# Patient Record
Sex: Male | Born: 1945 | Race: White | Hispanic: No | Marital: Single | Smoking: Current every day smoker
Health system: Southern US, Community
[De-identification: ages and names within clinical notes are randomized; demographics above are authoritative.]

## PROBLEM LIST (undated history)

## (undated) ENCOUNTER — Ambulatory Visit: Admission: EM | Discharge status: Absent without leave | Payer: Self-pay

## (undated) DIAGNOSIS — I10 Essential (primary) hypertension: Secondary | ICD-10-CM

## (undated) DIAGNOSIS — F0280 Dementia in other diseases classified elsewhere without behavioral disturbance: Secondary | ICD-10-CM

## (undated) DIAGNOSIS — R569 Unspecified convulsions: Secondary | ICD-10-CM

## (undated) DIAGNOSIS — F0390 Unspecified dementia without behavioral disturbance: Secondary | ICD-10-CM

---

## 2005-09-24 ENCOUNTER — Emergency Department (HOSPITAL_COMMUNITY): Admission: EM | Admit: 2005-09-24 | Discharge: 2005-09-24 | Payer: Self-pay | Admitting: Family Medicine

## 2005-11-27 ENCOUNTER — Emergency Department (HOSPITAL_COMMUNITY): Admission: EM | Admit: 2005-11-27 | Discharge: 2005-11-28 | Payer: Self-pay | Admitting: Emergency Medicine

## 2007-12-28 ENCOUNTER — Ambulatory Visit: Payer: Self-pay | Admitting: Internal Medicine

## 2013-10-18 ENCOUNTER — Ambulatory Visit: Payer: Self-pay | Admitting: Family Medicine

## 2013-10-25 ENCOUNTER — Ambulatory Visit: Payer: Self-pay | Admitting: Family Medicine

## 2013-11-01 ENCOUNTER — Ambulatory Visit: Payer: Self-pay | Admitting: Family Medicine

## 2013-11-08 ENCOUNTER — Ambulatory Visit: Payer: Self-pay | Admitting: Family Medicine

## 2014-01-05 ENCOUNTER — Ambulatory Visit: Payer: Self-pay | Admitting: Emergency Medicine

## 2014-01-25 ENCOUNTER — Ambulatory Visit: Payer: Self-pay

## 2016-04-12 ENCOUNTER — Encounter: Payer: Self-pay | Admitting: Emergency Medicine

## 2016-04-12 ENCOUNTER — Ambulatory Visit
Admission: EM | Admit: 2016-04-12 | Discharge: 2016-04-12 | Disposition: A | Discharge status: Home / Self Care | Payer: Worker's Compensation | Attending: Family Medicine | Admitting: Family Medicine

## 2016-04-12 ENCOUNTER — Ambulatory Visit (INDEPENDENT_AMBULATORY_CARE_PROVIDER_SITE_OTHER): Payer: Worker's Compensation

## 2016-04-12 DIAGNOSIS — S90111A Contusion of right great toe without damage to nail, initial encounter: Secondary | ICD-10-CM

## 2016-04-12 NOTE — Discharge Instructions (Signed)
Avoid reinjury. Apply ice as needed.    Return to Urgent care for new or worsening concerns.

## 2016-04-12 NOTE — ED Triage Notes (Signed)
Patient states that last night at work he ran into a pallet with his right foot and c/o pain in his right 1st toe.

## 2016-04-12 NOTE — ED Provider Notes (Signed)
[facility] ____________________________________________  Time seen: Approximately 1:45 PM  I have reviewed the triage vital signs and the nursing notes.   HISTORY  Chief Complaint Toe Pain and Worker's Comp Injury   HPI Jeremy HumphreysWilliam Henry De BurrsSmith Jr. is a 71 y.o. male presents for complaints of right great toe pain. 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 Patient reports early this morning he was at work, pushing a pallet through doors, and reports the doors hit the pallet causing him to hit the pallet. Patient states that his right great toe hit the palate. Patient reports pain immediately after injury. Patient reports continued to work throughout the night. Patient reports after sleeping during the day today, pain has much improved. Patient reports he did take over-the-counter ibuprofen. Patient states no pain with sitting still at current time, states minimal when walking. Denies pain radiation, paresthesias, other pain or injury. Denies fall to the ground. Denies head injury or loss consciousness. Patient reports otherwise feels well. Denies other complaints.   Denies recent sickness. Denies recent antibiotic use.    History reviewed. No pertinent past medical history.  There are no active problems to display for this patient.   History reviewed. No pertinent surgical history.   No current facility-administered medications for this encounter.  No current outpatient prescriptions on file.  Allergies Tetracyclines & related  History reviewed. No pertinent family history.  Social History Social History  Substance Use Topics  . Smoking status: Current Every Day Smoker    Types: Cigars  . Smokeless tobacco: Never Used  . Alcohol use No    Review of Systems Constitutional: No fever/chills Eyes: No visual changes. Cardiovascular: Denies chest pain. Respiratory: Denies shortness of breath. Gastrointestinal: No abdominal pain.  No nausea, no  vomiting.  No diarrhea.  No constipation. Musculoskeletal: Negative for back pain. Skin: Negative for rash. Neurological: Negative for  focal weakness or numbness.  10-point ROS otherwise negative.  ____________________________________________   PHYSICAL EXAM:  VITAL SIGNS: ED Triage Vitals  Enc Vitals Group     BP 04/12/16 1344 136/69     Pulse Rate 04/12/16 1344 69     Resp 04/12/16 1344 16     Temp 04/12/16 1344 98.7 F (37.1 C)     Temp Source 04/12/16 1344 Oral     SpO2 04/12/16 1344 97 %     Weight 04/12/16 1342 210 lb (95.3 kg)     Height 04/12/16 1342 5\' 10"  (1.778 m)     Head Circumference --      Peak Flow --      Pain Score 04/12/16 1344 5     Pain Loc --      Pain Edu? --      Excl. in GC? --     Constitutional: Alert and oriented. Well appearing and in no acute distress. Eyes: Conjunctivae are normal. PERRL. EOMI. ENT      Head: Normocephalic and atraumatic. Cardiovascular: Normal rate, regular rhythm. Grossly normal heart sounds.  Good peripheral circulation. Respiratory: Normal respiratory effort without tachypnea nor retractions. Breath sounds are clear and equal bilaterally. No wheezes, rales, rhonchi. Musculoskeletal:   No midline cervical, thoracic or lumbar tenderness to palpation. Bilateral pedal pulses equal and easily palpated.  except: Right great toe medially mild tenderness to palpation, full range of motion, no swelling, no ecchymosis, no motor or tendon deficit, normal distal sensation to right foot, right foot otherwise nontender, right great toe nail well attached, skin intact. Ambulatory with steady gait.  Callused toe nails to all toes right foot, nontender.  Neurologic:  Normal speech and language. Speech is normal. No gait instability.  Skin:  Skin is warm, dry Psychiatric: Mood and affect are normal. Speech and behavior are normal. Patient exhibits appropriate insight and judgment   ___________________________________________    LABS (all labs ordered are listed, but only abnormal results are displayed)  Labs Reviewed - No data to display  RADIOLOGY  Dg Toe Great Right  Result Date: 04/12/2016 CLINICAL DATA:  71 year old male with right great toe pain following injury last night. Initial encounter. EXAM: RIGHT GREAT TOE COMPARISON:  None. FINDINGS: There is no evidence of fracture or dislocation. There is no evidence of arthropathy or other focal bone abnormality. Soft tissues are unremarkable. IMPRESSION: Negative. Electronically Signed   By: Harmon Pier M.D.   On: 04/12/2016 14:16   ____________________________________________   PROCEDURES Procedures     INITIAL IMPRESSION / ASSESSMENT AND PLAN / ED COURSE  Pertinent labs & imaging results that were available during my care of the patient were reviewed by me and considered in my medical decision making (see chart for details).  Well-appearing patient. No acute distress. Present for the complaints of right great toe pain post injury at work last night. Per radiologist's right great toe x-ray negative. Suspect contusion injury. Patient states that he feels fine and ready to return to work. Note given that patient may return to work with no restrictions. Follow-up with Francesco Sor as needed for continued pain or issues.  Discussed follow up with Primary care physician this week. Discussed follow up and return parameters including no resolution or any worsening concerns. Patient verbalized understanding and agreed to plan.   ____________________________________________   FINAL CLINICAL IMPRESSION(S) / ED DIAGNOSES  Final diagnoses:  Contusion of right great toe without damage to nail, initial encounter     There are no discharge medications for this patient.   Note: This dictation was prepared with Dragon dictation along with smaller phrase technology. Any transcriptional errors that result from this process are unintentional.     Jeremy Dills, NP 04/12/16 [phone]

## 2017-01-18 ENCOUNTER — Other Ambulatory Visit: Payer: Self-pay | Admitting: Emergency Medicine

## 2017-01-18 ENCOUNTER — Emergency Department: Payer: Medicaid Other

## 2017-01-18 ENCOUNTER — Observation Stay
Admission: EM | Admit: 2017-01-18 | Discharge: 2017-01-21 | Disposition: A | Discharge status: Home / Self Care | Payer: Medicaid Other | Attending: Internal Medicine | Admitting: Internal Medicine

## 2017-01-18 DIAGNOSIS — Z6832 Body mass index (BMI) 32.0-32.9, adult: Secondary | ICD-10-CM | POA: Insufficient documentation

## 2017-01-18 DIAGNOSIS — R001 Bradycardia, unspecified: Secondary | ICD-10-CM | POA: Insufficient documentation

## 2017-01-18 DIAGNOSIS — Z79899 Other long term (current) drug therapy: Secondary | ICD-10-CM | POA: Insufficient documentation

## 2017-01-18 DIAGNOSIS — R29898 Other symptoms and signs involving the musculoskeletal system: Secondary | ICD-10-CM | POA: Diagnosis not present

## 2017-01-18 DIAGNOSIS — F17290 Nicotine dependence, other tobacco product, uncomplicated: Secondary | ICD-10-CM | POA: Diagnosis not present

## 2017-01-18 DIAGNOSIS — I10 Essential (primary) hypertension: Secondary | ICD-10-CM | POA: Insufficient documentation

## 2017-01-18 DIAGNOSIS — R410 Disorientation, unspecified: Principal | ICD-10-CM | POA: Insufficient documentation

## 2017-01-18 DIAGNOSIS — Z23 Encounter for immunization: Secondary | ICD-10-CM | POA: Diagnosis not present

## 2017-01-18 DIAGNOSIS — R4182 Altered mental status, unspecified: Secondary | ICD-10-CM | POA: Diagnosis present

## 2017-01-18 DIAGNOSIS — M795 Residual foreign body in soft tissue: Secondary | ICD-10-CM

## 2017-01-18 DIAGNOSIS — L8990 Pressure ulcer of unspecified site, unspecified stage: Secondary | ICD-10-CM

## 2017-01-18 DIAGNOSIS — T1590XA Foreign body on external eye, part unspecified, unspecified eye, initial encounter: Secondary | ICD-10-CM

## 2017-01-18 LAB — URINALYSIS, COMPLETE (UACMP) WITH MICROSCOPIC
BILIRUBIN URINE: NEGATIVE
Bacteria, UA: NONE SEEN
GLUCOSE, UA: NEGATIVE mg/dL
HGB URINE DIPSTICK: NEGATIVE
KETONES UR: 5 mg/dL — AB
LEUKOCYTES UA: NEGATIVE
Nitrite: NEGATIVE
PH: 5 (ref 5.0–8.0)
PROTEIN: NEGATIVE mg/dL
Specific Gravity, Urine: 1.026 (ref 1.005–1.030)

## 2017-01-18 LAB — URINE DRUG SCREEN, QUALITATIVE (ARMC ONLY)
Amphetamines, Ur Screen: NOT DETECTED
Barbiturates, Ur Screen: NOT DETECTED
Benzodiazepine, Ur Scrn: NOT DETECTED
CANNABINOID 50 NG, UR ~~LOC~~: NOT DETECTED
COCAINE METABOLITE, UR ~~LOC~~: NOT DETECTED
MDMA (ECSTASY) UR SCREEN: NOT DETECTED
Methadone Scn, Ur: NOT DETECTED
Opiate, Ur Screen: NOT DETECTED
PHENCYCLIDINE (PCP) UR S: NOT DETECTED
Tricyclic, Ur Screen: NOT DETECTED

## 2017-01-18 LAB — COMPREHENSIVE METABOLIC PANEL
ALBUMIN: 4.4 g/dL (ref 3.5–5.0)
ALK PHOS: 81 U/L (ref 38–126)
ALT: 20 U/L (ref 17–63)
ALT: 17 U/L (ref 17–63)
AST: 32 U/L (ref 15–41)
AST: 32 U/L (ref 15–41)
Albumin: 3.8 g/dL (ref 3.5–5.0)
Alkaline Phosphatase: 74 U/L (ref 38–126)
Anion gap: 9 (ref 5–15)
Anion gap: 8 (ref 5–15)
BILIRUBIN TOTAL: 1.5 mg/dL — AB (ref 0.3–1.2)
BILIRUBIN TOTAL: 2 mg/dL — AB (ref 0.3–1.2)
BUN: 24 mg/dL — AB (ref 6–20)
BUN: 19 mg/dL (ref 6–20)
CALCIUM: 8.9 mg/dL (ref 8.9–10.3)
CHLORIDE: 107 mmol/L (ref 101–111)
CO2: 24 mmol/L (ref 22–32)
CO2: 24 mmol/L (ref 22–32)
CREATININE: 1.08 mg/dL (ref 0.61–1.24)
CREATININE: 0.96 mg/dL (ref 0.61–1.24)
Calcium: 8.6 mg/dL — ABNORMAL LOW (ref 8.9–10.3)
Chloride: 106 mmol/L (ref 101–111)
GFR calc Af Amer: >60 mL/min (ref >60)
GLUCOSE: 105 mg/dL — AB (ref 65–99)
Glucose, Bld: 93 mg/dL (ref 65–99)
POTASSIUM: 3.7 mmol/L (ref 3.5–5.1)
POTASSIUM: 3.9 mmol/L (ref 3.5–5.1)
Sodium: 139 mmol/L (ref 135–145)
Sodium: 139 mmol/L (ref 135–145)
TOTAL PROTEIN: 7.8 g/dL (ref 6.5–8.1)
TOTAL PROTEIN: 7 g/dL (ref 6.5–8.1)

## 2017-01-18 LAB — CBC WITH DIFFERENTIAL/PLATELET
Basophils Absolute: 0 10*3/uL (ref 0–0.1)
Basophils Relative: 1 %
EOS PCT: 1 %
Eosinophils Absolute: 0.1 10*3/uL (ref 0–0.7)
HEMATOCRIT: 47 % (ref 40.0–52.0)
Hemoglobin: 16.2 g/dL (ref 13.0–18.0)
LYMPHS ABS: 1.4 10*3/uL (ref 1.0–3.6)
LYMPHS PCT: 26 %
MCH: 32.1 pg (ref 26.0–34.0)
MCHC: 34.4 g/dL (ref 32.0–36.0)
MCV: 93.4 fL (ref 80.0–100.0)
MONO ABS: 0.5 10*3/uL (ref 0.2–1.0)
MONOS PCT: 10 %
NEUTROS ABS: 3.4 10*3/uL (ref 1.4–6.5)
Neutrophils Relative %: 62 %
PLATELETS: 200 10*3/uL (ref 150–440)
RBC: 5.04 MIL/uL (ref 4.40–5.90)
RDW: 13.3 % (ref 11.5–14.5)
WBC: 5.5 10*3/uL (ref 3.8–10.6)

## 2017-01-18 LAB — TROPONIN I

## 2017-01-18 LAB — LACTIC ACID, PLASMA: Lactic Acid, Venous: 1.3 mmol/L (ref 0.5–1.9)

## 2017-01-18 LAB — GLUCOSE, CAPILLARY: GLUCOSE-CAPILLARY: 103 mg/dL — AB (ref 65–99)

## 2017-01-18 LAB — ETHANOL

## 2017-01-18 LAB — AMMONIA: AMMONIA: 41 umol/L — AB (ref 9–35)

## 2017-01-18 MED ORDER — ACETAMINOPHEN 650 MG RE SUPP: 650.0000 mg | Freq: Four times a day (QID) | RECTAL | Status: DC | PRN

## 2017-01-18 MED ORDER — AMIODARONE HCL IN DEXTROSE 360-4.14 MG/200ML-% IV SOLN
INTRAVENOUS | Status: AC
  Filled 2017-01-18: qty 200

## 2017-01-18 MED ORDER — ACETAMINOPHEN 325 MG PO TABS: 650.0000 mg | ORAL_TABLET | Freq: Four times a day (QID) | ORAL | Status: DC | PRN

## 2017-01-18 MED ORDER — HALOPERIDOL LACTATE 5 MG/ML IJ SOLN
2.5000 mg | Freq: Once | INTRAMUSCULAR | Status: AC
  Administered 2017-01-18: 2.5 mg via INTRAMUSCULAR

## 2017-01-18 MED ORDER — LORAZEPAM 2 MG/ML IJ SOLN
1.0000 mg | Freq: Once | INTRAMUSCULAR | Status: AC
  Administered 2017-01-18: 1 mg via INTRAVENOUS
  Filled 2017-01-18: qty 1

## 2017-01-18 MED ORDER — ONDANSETRON HCL 4 MG/2ML IJ SOLN: 4.0000 mg | Freq: Four times a day (QID) | INTRAMUSCULAR | Status: DC | PRN

## 2017-01-18 MED ORDER — HALOPERIDOL LACTATE 5 MG/ML IJ SOLN: 2.0000 mg | Freq: Four times a day (QID) | INTRAMUSCULAR | Status: DC | PRN

## 2017-01-18 MED ORDER — AMIODARONE IV BOLUS ONLY 150 MG/100ML
INTRAVENOUS | Status: AC
  Filled 2017-01-18: qty 100

## 2017-01-18 MED ORDER — SODIUM CHLORIDE 0.9 % IV SOLN
INTRAVENOUS | Status: DC
  Administered 2017-01-18 – 2017-01-21 (×5): via INTRAVENOUS

## 2017-01-18 MED ORDER — ENOXAPARIN SODIUM 40 MG/0.4ML ~~LOC~~ SOLN
40.0000 mg | SUBCUTANEOUS | Status: DC
  Administered 2017-01-19 – 2017-01-21 (×3): 40 mg via SUBCUTANEOUS
  Filled 2017-01-18 (×3): qty 0.4

## 2017-01-18 MED ORDER — ONDANSETRON HCL 4 MG PO TABS: 4.0000 mg | ORAL_TABLET | Freq: Four times a day (QID) | ORAL | Status: DC | PRN

## 2017-01-18 NOTE — Consult Note (Signed)
Reason for Consult:AMS Referring Physician: Cherlynn KaiserSainani  CC: AMS  HPI: Jeremy NickelsWilliam Henry Canul Jr. is an 71 y.o. male who is unable to provide any history due to his mental status. No family or friends are available, therefore all history obtained from the chart.  Patient presented to the hospital due to altered mental status, hallucinations.  Patient was brought in by EMS from his work at [employer] as he was found in his car altered and confused and hallucinating.  Patient in the emergency room was intermittently bradycardic.    Past medical history: Unable to obtain due to mental status  Surgical history: Unable to obtain due to mental status  Family history: Unable to obtain due to mental status  Social History:  reports that he has been smoking cigars.  he has never used smokeless tobacco. He reports that he does not drink alcohol. His drug history is not on file.  Allergies  Allergen Reactions  . Tetracyclines & Related Hives    Medications:  I have reviewed the patient's current medications. Prior to Admission:  No medications prior to admission.   Scheduled: . enoxaparin (LOVENOX) injection  40 mg Subcutaneous Q24H    ROS: Unable to obtain due to mental status  Physical Examination: Blood pressure 139/75, pulse (!) 48, temperature 98.8 F (37.1 C), temperature source Oral, resp. rate 18, height 5\' 11"  (1.803 m), weight 107 kg (235 lb 14.4 oz), SpO2 95 %.  HEENT-  Normocephalic, no lesions, without obvious abnormality.  Normal external eye and conjunctiva.  Normal TM's bilaterally.  Normal auditory canals and external ears. Normal external nose, mucus membranes and septum.  Normal pharynx. Cardiovascular- S1, S2 normal, pulses palpable throughout   Lungs- chest clear, no wheezing, rales, normal symmetric air entry Abdomen- distended Extremities- no edema Lymph-no adenopathy palpable Musculoskeletal-no joint tenderness, deformity or swelling Skin-warm and dry, no  hyperpigmentation, vitiligo, or suspicious lesions  Neurological Examination   Mental Status: Agitated.  Does not follow commands.  Only speaks with explicatives but is fluent when he does speak.   Cranial Nerves: II: Discs flat bilaterally; Blinks to bilateral confrontation, pupils equal, round, reactive to light and accommodation III,IV, VI: ptosis not present, extra-ocular motions intact grossly V,VII: grimace symmetric, facial light touch sensation normal bilaterally VIII: hearing normal bilaterally IX,X: gag reflex present XI: bilateral shoulder shrug XII: midline tongue extension Motor: Moves all extremities spontaneously with no focal weakness noted.   Sensory: Responds to noxious stimuli in all extremities Deep Tendon Reflexes: 2+ and symmetric throughout Plantars: Unable to test due to agitation Cerebellar: Unable to perform due to altered mental status Gait: not tested due to safety concerns    Laboratory Studies:   Basic Metabolic Panel: Recent Labs  Lab 01/18/17 0200 01/18/17 1019  NA 139 139  K 3.7 3.9  CL 106 107  CO2 24 24  GLUCOSE 105* 93  BUN 24* 19  CREATININE 1.08 0.96  CALCIUM 8.9 8.6*    Liver Function Tests: Recent Labs  Lab 01/18/17 0200 01/18/17 1019  AST 32 32  ALT 20 17  ALKPHOS 81 74  BILITOT 1.5* 2.0*  PROT 7.8 7.0  ALBUMIN 4.4 3.8   No results for input(s): LIPASE, AMYLASE in the last 168 hours. Recent Labs  Lab 01/18/17 0200  AMMONIA 41*    CBC: Recent Labs  Lab 01/18/17 1019  WBC 5.5  NEUTROABS 3.4  HGB 16.2  HCT 47.0  MCV 93.4  PLT 200    Cardiac Enzymes: Recent Labs  Lab 01/18/17 0200 01/18/17 0620  TROPONINI <0.03 <0.03    BNP: Invalid input(s): POCBNP  CBG: Recent Labs  Lab 01/18/17 0630  GLUCAP 103*    Microbiology: No results found for this or any previous visit.  Coagulation Studies: No results for input(s): LABPROT, INR in the last 72 hours.  Urinalysis:  Recent Labs  Lab  01/18/17 0339  COLORURINE YELLOW*  LABSPEC 1.026  PHURINE 5.0  GLUCOSEU NEGATIVE  HGBUR NEGATIVE  BILIRUBINUR NEGATIVE  KETONESUR 5*  PROTEINUR NEGATIVE  NITRITE NEGATIVE  LEUKOCYTESUR NEGATIVE    Lipid Panel:  No results found for: CHOL, TRIG, HDL, CHOLHDL, VLDL, LDLCALC  HgbA1C: No results found for: HGBA1C  Urine Drug Screen:      Component Value Date/Time   LABOPIA NONE DETECTED 01/18/2017 0050   COCAINSCRNUR NONE DETECTED 01/18/2017 0050   LABBENZ NONE DETECTED 01/18/2017 0050   AMPHETMU NONE DETECTED 01/18/2017 0050   THCU NONE DETECTED 01/18/2017 0050   LABBARB NONE DETECTED 01/18/2017 0050    Alcohol Level:  Recent Labs  Lab 01/18/17 0200  ETH <10    Imaging: Ct Head Wo Contrast  Result Date: 01/18/2017 CLINICAL DATA:  Altered mental status, worsened since admission. EXAM: CT HEAD WITHOUT CONTRAST TECHNIQUE: Contiguous axial images were obtained from the base of the skull through the vertex without intravenous contrast. COMPARISON:  Earlier today. FINDINGS: Brain: Diffusely enlarged ventricles and subarachnoid spaces. Patchy white matter low density in both cerebral hemispheres. No intracranial hemorrhage, mass lesion or CT evidence of acute infarction. Vascular: No hyperdense vessel or unexpected calcification. Skull: Normal. Negative for fracture or focal lesion. Sinuses/Orbits: Unremarkable. Other: None. IMPRESSION: 1. No acute abnormality. 2. Stable atrophy and chronic small vessel white matter ischemic changes. Electronically Signed   By: Beckie Salts M.D.   On: 01/18/2017 07:22   Ct Head Wo Contrast  Result Date: 01/18/2017 CLINICAL DATA:  Altered mental status.  Confusion. EXAM: CT HEAD WITHOUT CONTRAST TECHNIQUE: Contiguous axial images were obtained from the base of the skull through the vertex without intravenous contrast. COMPARISON:  None. FINDINGS: Brain: Generalized cerebral atrophy. Moderate to advanced periventricular white matter changes. No  intracranial hemorrhage, mass effect, or midline shift. No hydrocephalus. The basilar cisterns are patent. No evidence of territorial infarct or acute ischemia. No extra-axial or intracranial fluid collection. Vascular: No hyperdense vessel or unexpected calcification. Skull: No fracture or focal lesion. Sinuses/Orbits: Paranasal sinuses and mastoid air cells are clear. The visualized orbits are unremarkable. Other: None. IMPRESSION: 1.  No acute intracranial abnormality. 2. Mild atrophy and moderate to advanced periventricular white matter changes, typically secondary to chronic small vessel ischemia. Electronically Signed   By: Rubye Oaks M.D.   On: 01/18/2017 04:34     Assessment/Plan: 71 year old male with altered mental status.  Etiology unclear.  Lab work including UDS are unremarkable.  Unknown PMH.  Head CT reviewed and shows no acute changes.  Neurological examination is nonfocal.  Recommendations: 1.  MRI of the brain  2.  If MRI of the brain unable to be performed, CTA may be helpful 3.  EEG  Thana Farr, MD Neurology [phone] 01/18/2017, 6:39 PM

## 2017-01-18 NOTE — Progress Notes (Signed)
PT Cancellation Note  Patient Details Name: Jeremy NickelsWilliam Henry Kulzer Jr. MRN: [id number] DOB: 05/29/1945   Cancelled Treatment:    Reason Eval/Treat Not Completed: Other (comment). Consult received and chart reviewed. Pt was working 3rd shift at [employer] when noticed to have AMS symptoms per co-workers. Pt currently agitated with blanket over his head. Per RN, pt combative at times. Not appropriate for PT intervention this date. Will re-attempt next date to assess for PT needs.   Chelsea Nusz 01/18/2017, 2:07 PM Elizabeth PalauStephanie Darren Caldron, PT, DPT [phone]

## 2017-01-18 NOTE — Progress Notes (Deleted)
Paged MD on call to inform of Hgb 6.9. New orders placed.

## 2017-01-18 NOTE — ED Notes (Signed)
Per EMS, patient's coworker (name unknown) can pick patient up when discharged.  Phone number [phone]

## 2017-01-18 NOTE — H&P (Signed)
[facility] - [place] [facility]   PATIENT NAME: Jeremy LevinsWilliam Shur    MR#:  [id number]  DATE OF BIRTH:  [birth year]  DATE OF ADMISSION:  01/18/2017  PRIMARY CARE PHYSICIAN: Patient, No Pcp Per   REQUESTING/REFERRING PHYSICIAN: Dr. Zenda AlpersWebster  CHIEF COMPLAINT:   Chief Complaint  Patient presents with  . Altered Mental Status    HISTORY OF PRESENT ILLNESS:  Jeremy Christensen  is a 71 y.o. male with no known past medical history who presents to the hospital due to altered mental status, hallucinations.  Patient himself is currently altered and has a very poor historian therefore most history obtained from the chart.  I attempted to call the phone numbers in the chart but both were disconnected.  Patient was brought in by EMS from his work at [employer] as he was found in his car altered and confused and hallucinating.  Patient in the emergency room was intermittently bradycardic.  He underwent a CT scan of his head x2 which have been negative for acute pathology.  He has no past medical history or any previous psychiatric history.  Due to his ongoing mental status change hospitalist services were contacted for treatment evaluation.  PAST MEDICAL HISTORY:  History reviewed. No pertinent past medical history.  PAST SURGICAL HISTORY:  History reviewed. No pertinent surgical history.  SOCIAL HISTORY:   Social History   Tobacco Use  . Smoking status: Current Every Day Smoker    Types: Cigars  . Smokeless tobacco: Never Used  Substance Use Topics  . Alcohol use: No    FAMILY HISTORY:  No family history on file. Unobtainable due to pt's mental status  DRUG ALLERGIES:   Allergies  Allergen Reactions  . Tetracyclines & Related Hives    REVIEW OF SYSTEMS:   Review of Systems  Unable to perform ROS: Mental acuity    MEDICATIONS AT HOME:   Prior to Admission medications   Not on File      VITAL SIGNS:  Blood pressure (!) 149/74, pulse 67, resp. rate (!) 26, SpO2  98 %.  PHYSICAL EXAMINATION:  Physical Exam  GENERAL:  71 y.o.-year-old patient lying in the bed confused but in no acute distress.  EYES: Pupils equal, round, reactive to light and accommodation. No scleral icterus. Extraocular muscles intact.  HEENT: Head atraumatic, normocephalic. Oropharynx and nasopharynx clear. No oropharyngeal erythema, moist oral mucosa  NECK:  Supple, no jugular venous distention. No thyroid enlargement, no tenderness.  LUNGS: Normal breath sounds bilaterally, no wheezing, rales, rhonchi. No use of accessory muscles of respiration.  CARDIOVASCULAR: S1, S2 RRR. No murmurs, rubs, gallops, clicks.  ABDOMEN: Soft, nontender, nondistended. Bowel sounds present. No organomegaly or mass.  EXTREMITIES: No pedal edema, cyanosis, or clubbing. + 2 pedal & radial pulses b/l.   NEUROLOGIC: Cranial nerves II through XII are intact. No focal Motor or sensory deficits appreciated b/l PSYCHIATRIC: The patient is alert and oriented x 1.  SKIN: No obvious rash, lesion, or ulcer.   LABORATORY PANEL:   CBC No results for input(s): WBC, HGB, HCT, PLT in the last 168 hours. ------------------------------------------------------------------------------------------------------------------  Chemistries  Recent Labs  Lab 01/18/17 0200  NA 139  K 3.7  CL 106  CO2 24  GLUCOSE 105*  BUN 24*  CREATININE 1.08  CALCIUM 8.9  AST 32  ALT 20  ALKPHOS 81  BILITOT 1.5*   ------------------------------------------------------------------------------------------------------------------  Cardiac Enzymes Recent Labs  Lab 01/18/17 0620  TROPONINI <0.03   ------------------------------------------------------------------------------------------------------------------  RADIOLOGY:  Ct  Head Wo Contrast  Result Date: 01/18/2017 CLINICAL DATA:  Altered mental status, worsened since admission. EXAM: CT HEAD WITHOUT CONTRAST TECHNIQUE: Contiguous axial images were obtained from the base  of the skull through the vertex without intravenous contrast. COMPARISON:  Earlier today. FINDINGS: Brain: Diffusely enlarged ventricles and subarachnoid spaces. Patchy white matter low density in both cerebral hemispheres. No intracranial hemorrhage, mass lesion or CT evidence of acute infarction. Vascular: No hyperdense vessel or unexpected calcification. Skull: Normal. Negative for fracture or focal lesion. Sinuses/Orbits: Unremarkable. Other: None. IMPRESSION: 1. No acute abnormality. 2. Stable atrophy and chronic small vessel white matter ischemic changes. Electronically Signed   By: Beckie Salts M.D.   On: 01/18/2017 07:22   Ct Head Wo Contrast  Result Date: 01/18/2017 CLINICAL DATA:  Altered mental status.  Confusion. EXAM: CT HEAD WITHOUT CONTRAST TECHNIQUE: Contiguous axial images were obtained from the base of the skull through the vertex without intravenous contrast. COMPARISON:  None. FINDINGS: Brain: Generalized cerebral atrophy. Moderate to advanced periventricular white matter changes. No intracranial hemorrhage, mass effect, or midline shift. No hydrocephalus. The basilar cisterns are patent. No evidence of territorial infarct or acute ischemia. No extra-axial or intracranial fluid collection. Vascular: No hyperdense vessel or unexpected calcification. Skull: No fracture or focal lesion. Sinuses/Orbits: Paranasal sinuses and mastoid air cells are clear. The visualized orbits are unremarkable. Other: None. IMPRESSION: 1.  No acute intracranial abnormality. 2. Mild atrophy and moderate to advanced periventricular white matter changes, typically secondary to chronic small vessel ischemia. Electronically Signed   By: Rubye Oaks M.D.   On: 01/18/2017 04:34     IMPRESSION AND PLAN:   71 year old male with no significant past medical history who presents to the hospital due to altered mental status.  1.  Altered mental status-etiology unclear but suspected to be secondary to metabolic  encephalopathy of unknown source. -Urinalysis is negative, electrolytes are within normal range. -Patient CT head x2 have been negative.  He continues to be confused and agitated at times. -I will order some Haldol as needed for his agitation. -I will also get a MRI of his brain, get a neurology consult.  If the MRI is negative, will consider getting a psychiatric input.  2.  Sinus bradycardia-patient had heart rates in the ER as low as the 30s.  He is hemodynamically stable, I will observe him on telemetry.  His cardiac markers x2 have been negative.   All the records are reviewed and case discussed with ED provider. Management plans discussed with the patient, family and they are in agreement.  CODE STATUS: Full code  TOTAL TIME TAKING CARE OF THIS PATIENT: 45 minutes.    Houston Siren M.D on 01/18/2017 at 8:44 AM  Between 7am to 6pm - Pager - [phone]  After 6pm go to [email or web address] - password [email or web address]  [facility]  Office  [phone]  CC: Primary care physician; Patient, No Pcp Per

## 2017-01-18 NOTE — Care Management Obs Status (Signed)
MEDICARE OBSERVATION STATUS NOTIFICATION   Patient Details  Name: Jeremy NickelsWilliam Henry Bultman Jr. MRN: [id number] Date of Birth: 11-Apr-1945   Medicare Observation Status Notification Given:  Yes    Rozanna Cormany A, RN 01/18/2017, 10:33 AM

## 2017-01-18 NOTE — ED Notes (Signed)
RN to bedside to collect troponin patient no longer verbal. Patient playing with cables and scratching face. Patient would not follow RN instructions. Patient asked to say any word to let RN know that he was able to speak.  Patient looked at RN, and continued to be nonverbal.  MD Dolores FrameSung informed. MD Dolores FrameSung to bedside

## 2017-01-18 NOTE — ED Notes (Signed)
Patient's co-worker, Tammy, came to the ED and gave her number incase patient needs her.  Contact info was placed in chart.  She states patient had said he hadn't been home in a few days and she is concerned that patient was living in his car the past few days because she saw a lot of bags in his car.  She states at work patient was putting the wrong foods on the wrong shelves this morning which was unusual.  She states [facility].

## 2017-01-18 NOTE — ED Notes (Signed)
Upon entering room, patient found to be pulling off tele monitor. Patient states, "I'm in fucking hell". Patient reoriented. Unsuccessful. Patient unable to redirect. Admitting MD notified and aware. Patient remains on zoll.

## 2017-01-18 NOTE — ED Provider Notes (Signed)
-----------------------------------------   6:32 AM on 01/18/2017 -----------------------------------------  Primary EP upstairs in a code blue.  I was called to the bedside by patient's primary nurse for patient suddenly nonverbal.  Prior to that he had been conversant and disoriented only to date.  Initial head CT negative.  He is looking around dazed, not answering questions, scratching his face with his left hand.  Will repeat head CT given this sudden decompensation.   Irean HongSung, Damita Eppard J, MD 01/18/17 [phone]

## 2017-01-18 NOTE — ED Provider Notes (Addendum)
[facility] Emergency Department Provider Note   ____________________________________________   First MD Initiated Contact with Patient 01/18/17 0148     (approximate)  I have reviewed the triage vital signs and the nursing notes.   HISTORY  Chief Complaint Altered Mental Status    HPI Jeremy Christensen. is a 71 y.o. male who comes into the hospital today with some confusion. The patient works at [employer] and was found by his coworkers in the back room confused. The patient did not know how he got there or what he needed to do. According to EMS his blood sugar was 133 and his heart rate was 84. The patient's blood pressure was 124/72. The patient has not had any trauma he's just confused. He denies any weakness. He reports that it feels like dj vu. He states that he feels like he was doing the same thing over and over. He denies any syncope or loss of consciousness. He cannot figure out why he is having these symptoms. He is unsure of his age and doesn't know exactly what year it is. The patient thinks it is October. He told his supervisor that he felt  like he was doing the same day he was doing yesterday. The patient reports that he doesn't know why that is a big deal. The patient has also been hallucinating according to staff. He is seeing things on the wall that is not there. The patient is here today for evaluation.   History reviewed. No pertinent past medical history.  There are no active problems to display for this patient.   History reviewed. No pertinent surgical history.  Prior to Admission medications   Not on File    Allergies Tetracyclines & related  No family history on file.  Social History Social History   Tobacco Use  . Smoking status: Current Every Day Smoker    Types: Cigars  . Smokeless tobacco: Never Used  Substance Use Topics  . Alcohol use: No  . Drug use: Not on file    Review of Systems  Constitutional: No  fever/chills Eyes: No visual changes. ENT: No sore throat. Cardiovascular: Denies chest pain. Respiratory: Denies shortness of breath. Gastrointestinal: No abdominal pain.  No nausea, no vomiting.  No diarrhea.  No constipation. Genitourinary: Negative for dysuria. Musculoskeletal: Negative for back pain. Skin: Negative for rash. Neurological: confusion and hallucinations.   ____________________________________________   PHYSICAL EXAM:  VITAL SIGNS: ED Triage Vitals  Enc Vitals Group     BP 01/18/17 0330 (!) 151/81     Pulse Rate 01/18/17 0323 (!) 56     Resp 01/18/17 0323 18     Temp --      Temp src --      SpO2 01/18/17 0323 97 %     Weight --      Height --      Head Circumference --      Peak Flow --      Pain Score --      Pain Loc --      Pain Edu? --      Excl. in GC? --     Constitutional: Alert and confuse. Well appearing and in no acute distress. Eyes: Conjunctivae are normal. PERRL. EOMI. Head: Atraumatic. Nose: No congestion/rhinnorhea. Mouth/Throat: Mucous membranes are moist.  Oropharynx non-erythematous. Cardiovascular: Normal rate, regular rhythm. Grossly normal heart sounds.  Good peripheral circulation. Respiratory: Normal respiratory effort.  No retractions. Lungs CTAB. Gastrointestinal: Soft and nontender.  No distention. positive bowel sounds Musculoskeletal: No lower extremity tenderness nor edema.  Neurologic:  Normal speech and language. cranial nerves II through XII are grossly intact with no focal motor or neuro deficits Skin:  Skin is warm, dry and intact.  Psychiatric: Mood and affect are normal.   ____________________________________________   LABS (all labs ordered are listed, but only abnormal results are displayed)  Labs Reviewed  GLUCOSE, CAPILLARY - Abnormal; Notable for the following components:      Result Value   Glucose-Capillary 103 (*)    All other components within normal limits  TROPONIN I  URINALYSIS, COMPLETE  (UACMP) WITH MICROSCOPIC  CBC WITH DIFFERENTIAL/PLATELET  COMPREHENSIVE METABOLIC PANEL  URINE DRUG SCREEN, QUALITATIVE ([facility] ONLY)   ____________________________________________  EKG  ED ECG REPORT I, Rebecka ApleyWebster,  Allison P, the attending physician, personally viewed and interpreted this ECG.   Date: 01/18/2017  EKG Time: 155  Rate: 55  Rhythm: sinus bradycardia  Axis: normal  Intervals:none  ST&T Change: none  ____________________________________________  RADIOLOGY  Ct Head Wo Contrast  Result Date: 01/18/2017 CLINICAL DATA:  Altered mental status, worsened since admission. EXAM: CT HEAD WITHOUT CONTRAST TECHNIQUE: Contiguous axial images were obtained from the base of the skull through the vertex without intravenous contrast. COMPARISON:  Earlier today. FINDINGS: Brain: Diffusely enlarged ventricles and subarachnoid spaces. Patchy white matter low density in both cerebral hemispheres. No intracranial hemorrhage, mass lesion or CT evidence of acute infarction. Vascular: No hyperdense vessel or unexpected calcification. Skull: Normal. Negative for fracture or focal lesion. Sinuses/Orbits: Unremarkable. Other: None. IMPRESSION: 1. No acute abnormality. 2. Stable atrophy and chronic small vessel white matter ischemic changes. Electronically Signed   By: Beckie SaltsSteven  Reid M.D.   On: 01/18/2017 07:22   Ct Head Wo Contrast  Result Date: 01/18/2017 CLINICAL DATA:  Altered mental status.  Confusion. EXAM: CT HEAD WITHOUT CONTRAST TECHNIQUE: Contiguous axial images were obtained from the base of the skull through the vertex without intravenous contrast. COMPARISON:  None. FINDINGS: Brain: Generalized cerebral atrophy. Moderate to advanced periventricular white matter changes. No intracranial hemorrhage, mass effect, or midline shift. No hydrocephalus. The basilar cisterns are patent. No evidence of territorial infarct or acute ischemia. No extra-axial or intracranial fluid collection. Vascular: No  hyperdense vessel or unexpected calcification. Skull: No fracture or focal lesion. Sinuses/Orbits: Paranasal sinuses and mastoid air cells are clear. The visualized orbits are unremarkable. Other: None. IMPRESSION: 1.  No acute intracranial abnormality. 2. Mild atrophy and moderate to advanced periventricular white matter changes, typically secondary to chronic small vessel ischemia. Electronically Signed   By: Rubye OaksMelanie  Ehinger M.D.   On: 01/18/2017 04:34    ____________________________________________   PROCEDURES  Procedure(s) performed: None  Procedures  Critical Care performed: No  ____________________________________________   INITIAL IMPRESSION / ASSESSMENT AND PLAN / ED COURSE  As part of my medical decision making, I reviewed the following data within the electronic MEDICAL RECORD NUMBER Notes from prior ED visits and [place] Controlled Substance Database  This is a 71 year old male who comes into the hospital today with altered mental status.  while the patient has no acute distress at this time my differential diagnosis includes electrolyte abnormality, CVA, urinary tract infection,  I did send the patient for a CT scan initially as well as some blood work. The patient's CT scan was unremarkable and his blood work was unremarkable. We did not have a urinalysis on the patient. the patient had no complaints and was resting as were waiting for the  urinalysis. While I was gone the patient became unresponsive and would not follow commands. He did receive a repeat CT scan. given the patient's persistent alteration of his mental status I will admit the patient to the hospitalist service. I did go back into the room and the patient is able to talk and is moving his extremities. I did ask him to smile and he did ask what for. The patient would not follow my command and said that he did not think he was able to but he was moving his mouth equally bilaterally. Again I will admit the patient to the  hospitalist service for confusion and altered mental status.  The patient did have some episodes of bradycardia while in the emergency department. He was in the 40s and I think did have an episode into the 30s. His blood pressure did not decrease. Again the patient will be admitted to the hospitalist service.     ____________________________________________   FINAL CLINICAL IMPRESSION(S) / ED DIAGNOSES  Final diagnoses:  Disorientation  Confusion      NEW MEDICATIONS STARTED DURING THIS VISIT:  This SmartLink is deprecated. Use AVSMEDLIST instead to display the medication list for a patient.   Note:  This document was prepared using Dragon voice recognition software and may include unintentional dictation errors.    Rebecka Apley, MD 01/18/17 0725    Rebecka Apley, MD 01/18/17 [phone]

## 2017-01-18 NOTE — ED Notes (Signed)
Patient grabbing at lights on wall behind his head. When this RN went in to replace light, patient jerked hand away and told this RN to "go the fuck on you stupid bitch". Kara MeadEmma, RN attempted to get light out of patient's hand and patient swung light trying to hit her. Light was removed from patient's hand and placed back on wall. Patient's bed was moved out from wall and other non-essential items removed from reach. Patient continuing to curse at staff but making no physical threats. Patient remains laying in bed at this time. MD made aware. Will continue to monitor.

## 2017-01-18 NOTE — ED Notes (Signed)
RN transported patient to CT.

## 2017-01-18 NOTE — ED Notes (Signed)
Patient continues pulling at clothes and has removed zoll pads at this time. Sitter at bedside. Will continue to monitor.

## 2017-01-19 ENCOUNTER — Observation Stay: Payer: Medicaid Other

## 2017-01-19 ENCOUNTER — Observation Stay (HOSPITAL_BASED_OUTPATIENT_CLINIC_OR_DEPARTMENT_OTHER): Payer: Medicaid Other

## 2017-01-19 ENCOUNTER — Other Ambulatory Visit: Payer: Self-pay

## 2017-01-19 DIAGNOSIS — R410 Disorientation, unspecified: Secondary | ICD-10-CM

## 2017-01-19 LAB — BASIC METABOLIC PANEL
ANION GAP: 6 (ref 5–15)
BUN: 15 mg/dL (ref 6–20)
CHLORIDE: 107 mmol/L (ref 101–111)
CO2: 24 mmol/L (ref 22–32)
Calcium: 8.3 mg/dL — ABNORMAL LOW (ref 8.9–10.3)
Creatinine, Ser: 0.78 mg/dL (ref 0.61–1.24)
GFR calc non Af Amer: >60 mL/min (ref >60)
GLUCOSE: 81 mg/dL (ref 65–99)
POTASSIUM: 3.8 mmol/L (ref 3.5–5.1)
Sodium: 137 mmol/L (ref 135–145)

## 2017-01-19 LAB — CBC
HEMATOCRIT: 47.9 % (ref 40.0–52.0)
HEMOGLOBIN: 16.1 g/dL (ref 13.0–18.0)
MCH: 31.6 pg (ref 26.0–34.0)
MCHC: 33.5 g/dL (ref 32.0–36.0)
MCV: 94.4 fL (ref 80.0–100.0)
Platelets: 190 10*3/uL (ref 150–440)
RBC: 5.08 MIL/uL (ref 4.40–5.90)
RDW: 13.5 % (ref 11.5–14.5)
WBC: 5.8 10*3/uL (ref 3.8–10.6)

## 2017-01-19 LAB — GLUCOSE, CAPILLARY: GLUCOSE-CAPILLARY: 87 mg/dL (ref 65–99)

## 2017-01-19 MED ORDER — TRAMADOL HCL 50 MG PO TABS: 50.0000 mg | ORAL_TABLET | Freq: Four times a day (QID) | ORAL | Status: DC | PRN

## 2017-01-19 MED ORDER — AMLODIPINE BESYLATE 5 MG PO TABS
5.0000 mg | ORAL_TABLET | Freq: Every day | ORAL | Status: DC
  Administered 2017-01-19 – 2017-01-21 (×3): 5 mg via ORAL
  Filled 2017-01-19 (×3): qty 1

## 2017-01-19 NOTE — Evaluation (Signed)
Physical Therapy Evaluation Patient Details Name: Jeremy Christensen. MRN: [id number] DOB: May 28, 1945 Today's Date: 01/19/2017   History of Present Illness  Pt admitted for AMS and hallucinations. All imaging negative at this time. Per chart, pt works at [employer] and was found confused therefore admitted to the ER. Pt currently has sitter at this time.  Clinical Impression  Pt is a pleasant 71 year old male who was admitted for AMS while at work. Pt pleasant during PT eval.  Pt demonstrates all bed mobility/transfers/ambulation at baseline level. Encouraged to continue mobility efforts with sitter to prevent further weakness during hospital stay. Pt does not require any further PT needs at this time. Pt will be dc in house and does not require follow up. RN aware. Will dc current orders.      Follow Up Recommendations No PT follow up    Equipment Recommendations  None recommended by PT    Recommendations for Other Services       Precautions / Restrictions Precautions Precautions: Fall Restrictions Weight Bearing Restrictions: No      Mobility  Bed Mobility Overal bed mobility: Independent             General bed mobility comments: safe technique performed with ability to sit at EOB  Transfers Overall transfer level: Independent Equipment used: None             General transfer comment: safe technique performed with upright posture  Ambulation/Gait Ambulation/Gait assistance: Independent Ambulation Distance (Feet): 200 Feet Assistive device: None Gait Pattern/deviations: WFL(Within Functional Limits)     General Gait Details: safe technique performed with reciprocal gait pattern. Safe technique performed with ability to perform nurses station loop. Reciprocal arm swing and pt able to carry conversation with therapist. Pleasant at this time.  Stairs            Wheelchair Mobility    Modified Rankin (Stroke Patients Only)       Balance Overall  balance assessment: Independent                                           Pertinent Vitals/Pain Pain Assessment: No/denies pain    Home Living Family/patient expects to be discharged to:: Private residence Living Arrangements: Alone               Additional Comments: per chart pt currently living out of car    Prior Function Level of Independence: Independent         Comments: was independent and working until hospital admission. Reports no falls.     Hand Dominance        Extremity/Trunk Assessment   Upper Extremity Assessment Upper Extremity Assessment: Overall WFL for tasks assessed    Lower Extremity Assessment Lower Extremity Assessment: Overall WFL for tasks assessed       Communication   Communication: No difficulties  Cognition Arousal/Alertness: Awake/alert Behavior During Therapy: WFL for tasks assessed/performed Overall Cognitive Status: Within Functional Limits for tasks assessed                                        General Comments      Exercises     Assessment/Plan    PT Assessment Patent does not need any further PT services  PT Problem List  PT Treatment Interventions      PT Goals (Current goals can be found in the Care Plan section)  Acute Rehab PT Goals Patient Stated Goal: to leave the hospital PT Goal Formulation: All assessment and education complete, DC therapy Time For Goal Achievement: 01/19/17 Potential to Achieve Goals: Good    Frequency     Barriers to discharge        Co-evaluation               AM-PAC PT "6 Clicks" Daily Activity  Outcome Measure Difficulty turning over in bed (including adjusting bedclothes, sheets and blankets)?: None Difficulty moving from lying on back to sitting on the side of the bed? : None Difficulty sitting down on and standing up from a chair with arms (e.g., wheelchair, bedside commode, etc,.)?: None Help needed moving to and  from a bed to chair (including a wheelchair)?: None Help needed walking in hospital room?: None Help needed climbing 3-5 steps with a railing? : None 6 Click Score: 24    End of Session Equipment Utilized During Treatment: Gait belt Activity Tolerance: Patient tolerated treatment well Patient left: in bed;with nursing/sitter in room(refuses to sit in recliner at this time) Nurse Communication: Mobility status PT Visit Diagnosis: Other symptoms and signs involving the nervous system (R29.898)    Time: 1610-9604: 0933-0948 PT Time Calculation (min) (ACUTE ONLY): 15 min   Charges:   PT Evaluation $PT Eval Low Complexity: 1 Low PT Treatments $Gait Training: 8-22 mins   PT G Codes:   PT G-Codes **NOT FOR INPATIENT CLASS** Functional Assessment Tool Used: AM-PAC 6 Clicks Basic Mobility Functional Limitation: Mobility: Walking and moving around Mobility: Walking and Moving Around Current Status (V4098(G8978): 0 percent impaired, limited or restricted Mobility: Walking and Moving Around Goal Status (J1914(G8979): 0 percent impaired, limited or restricted Mobility: Walking and Moving Around Discharge Status (N8295(G8980): 0 percent impaired, limited or restricted    Elizabeth PalauStephanie Francisco Ostrovsky, PT, DPT [phone]   Jeremy Christensen 01/19/2017, 11:10 AM

## 2017-01-19 NOTE — Progress Notes (Signed)
[facility] [place] [facility]   PATIENT NAME: Jeremy LevinsWilliam Christensen    MR#:  [id number]  DATE OF BIRTH:  12/24/1945  SUBJECTIVE:   Here due to altered mental status/confusion. Sodium remained somewhat confused but better since yesterday. No other acute events overnight. Seen by physical therapy and they recommend no follow-up. Awaiting MRI, EEG today.  REVIEW OF SYSTEMS:    Review of Systems  Constitutional: Negative for chills and fever.  HENT: Negative for congestion and tinnitus.   Eyes: Negative for blurred vision and double vision.  Respiratory: Negative for cough, shortness of breath and wheezing.   Cardiovascular: Negative for chest pain, orthopnea and PND.  Gastrointestinal: Negative for abdominal pain, diarrhea, nausea and vomiting.  Genitourinary: Negative for dysuria and hematuria.  Neurological: Negative for dizziness, sensory change and focal weakness.  All other systems reviewed and are negative.   Nutrition: Regular Tolerating Diet: Yes Tolerating PT: Eval noted.   DRUG ALLERGIES:   Allergies  Allergen Reactions  . Tetracyclines & Related Hives    VITALS:  Blood pressure (!) 164/70, pulse 64, temperature 98.7 F (37.1 C), temperature source Oral, resp. rate 20, height 5\' 11"  (1.803 m), weight 107 kg (235 lb 14.4 oz), SpO2 94 %.  PHYSICAL EXAMINATION:   Physical Exam  GENERAL:  71 y.o.-year-old unkempt patient lying in bed in no acute distress.  EYES: Pupils equal, round, reactive to light and accommodation. No scleral icterus. Extraocular muscles intact.  HEENT: Head atraumatic, normocephalic. Oropharynx and nasopharynx clear.  NECK:  Supple, no jugular venous distention. No thyroid enlargement, no tenderness.  LUNGS: Normal breath sounds bilaterally, no wheezing, rales, rhonchi. No use of accessory muscles of respiration.  CARDIOVASCULAR: S1, S2 normal. No murmurs, rubs, or gallops.  ABDOMEN: Soft, nontender, nondistended. Bowel sounds  present. No organomegaly or mass.  EXTREMITIES: No cyanosis, clubbing or edema b/l.    NEUROLOGIC: Cranial nerves II through XII are intact. No focal Motor or sensory deficits b/l.   PSYCHIATRIC: The patient is alert and oriented x 1.  SKIN: No obvious rash, lesion, or ulcer.    LABORATORY PANEL:   CBC Recent Labs  Lab 01/19/17 0416  WBC 5.8  HGB 16.1  HCT 47.9  PLT 190   ------------------------------------------------------------------------------------------------------------------  Chemistries  Recent Labs  Lab 01/18/17 1019 01/19/17 0416  NA 139 137  K 3.9 3.8  CL 107 107  CO2 24 24  GLUCOSE 93 81  BUN 19 15  CREATININE 0.96 0.78  CALCIUM 8.6* 8.3*  AST 32  --   ALT 17  --   ALKPHOS 74  --   BILITOT 2.0*  --    ------------------------------------------------------------------------------------------------------------------  Cardiac Enzymes Recent Labs  Lab 01/18/17 0620  TROPONINI <0.03   ------------------------------------------------------------------------------------------------------------------  RADIOLOGY:  Ct Head Wo Contrast  Result Date: 01/18/2017 CLINICAL DATA:  Altered mental status, worsened since admission. EXAM: CT HEAD WITHOUT CONTRAST TECHNIQUE: Contiguous axial images were obtained from the base of the skull through the vertex without intravenous contrast. COMPARISON:  Earlier today. FINDINGS: Brain: Diffusely enlarged ventricles and subarachnoid spaces. Patchy white matter low density in both cerebral hemispheres. No intracranial hemorrhage, mass lesion or CT evidence of acute infarction. Vascular: No hyperdense vessel or unexpected calcification. Skull: Normal. Negative for fracture or focal lesion. Sinuses/Orbits: Unremarkable. Other: None. IMPRESSION: 1. No acute abnormality. 2. Stable atrophy and chronic small vessel white matter ischemic changes. Electronically Signed   By: Beckie SaltsSteven  Reid M.D.   On: 01/18/2017 07:22   Ct  Head Wo  Contrast  Result Date: 01/18/2017 CLINICAL DATA:  Altered mental status.  Confusion. EXAM: CT HEAD WITHOUT CONTRAST TECHNIQUE: Contiguous axial images were obtained from the base of the skull through the vertex without intravenous contrast. COMPARISON:  None. FINDINGS: Brain: Generalized cerebral atrophy. Moderate to advanced periventricular white matter changes. No intracranial hemorrhage, mass effect, or midline shift. No hydrocephalus. The basilar cisterns are patent. No evidence of territorial infarct or acute ischemia. No extra-axial or intracranial fluid collection. Vascular: No hyperdense vessel or unexpected calcification. Skull: No fracture or focal lesion. Sinuses/Orbits: Paranasal sinuses and mastoid air cells are clear. The visualized orbits are unremarkable. Other: None. IMPRESSION: 1.  No acute intracranial abnormality. 2. Mild atrophy and moderate to advanced periventricular white matter changes, typically secondary to chronic small vessel ischemia. Electronically Signed   By: Rubye Oaks M.D.   On: 01/18/2017 04:34     ASSESSMENT AND PLAN:   71 year old male with no significant past medical history who presents to the hospital due to altered mental status.  1.  Altered mental status-etiology unclear but suspected to be secondary to metabolic encephalopathy of unknown source. -Urinalysis is negative, electrolytes are within normal range. Patient's urine drug screen is negative. -Patient CT head x2 have been negative.   -Appreciate neurology input. Await MRI of the brain today. Await EEG. Mental status improved since yesterday. - Continue when necessary Haldol for agitation.  2.  Sinus bradycardia- Improved and pt. Is asymptomatic.   Seen by PT and they recommend no further follow up.  If MRI, EEG are not diagnostic then likely d/c home soon.   All the records are reviewed and case discussed with Care Management/Social Worker. Management plans discussed with the patient,  family and they are in agreement.  CODE STATUS: Full code  DVT Prophylaxis: Lovenox  TOTAL TIME TAKING CARE OF THIS PATIENT: 30 minutes.   POSSIBLE D/C IN 1-2 DAYS, DEPENDING ON CLINICAL CONDITION.   Houston Siren M.D on 01/19/2017 at 1:53 PM  Between 7am to 6pm - Pager - [phone]  After 6pm go to [email or web address] - Social research officer, government  [facility] [place] [facility]  Office  [phone]  CC: Primary care physician; Patient, No Pcp Per

## 2017-01-19 NOTE — Progress Notes (Signed)
Subjective: Patient appears to still be confused but much improved from yesterday. Does not recall details of yesterday.    Objective: Current vital signs: BP (!) 164/70   Pulse 64   Temp 98.7 F (37.1 C) (Oral)   Resp 20   Ht 5\' 11"  (1.803 m)   Wt 107 kg (235 lb 14.4 oz)   SpO2 94%   BMI 32.90 kg/m  Vital signs in last 24 hours: Temp:  [98.7 F (37.1 C)-98.8 F (37.1 C)] 98.7 F (37.1 C) (11/05 0625) Pulse Rate:  [54-64] 64 (11/05 1104) Resp:  [20] 20 (11/05 0625) BP: (142-164)/(65-70) 164/70 (11/05 1104) SpO2:  [94 %] 94 % (11/05 0625)  Intake/Output from previous day: 11/04 0701 - 11/05 0700 In: 315 [I.V.:315] Out: 4 [Urine:1; Stool:3] Intake/Output this shift: Total I/O In: 120 [P.O.:120] Out: -  Nutritional status: Diet Heart Room service appropriate? Yes; Fluid consistency: Thin  Neurologic Exam: Mental Status: Alert.  Knows where he is and what kind of job he does but can not tell me the date or year.  Reports that he looks at the phone for that.  Speech fluent without evidence of aphasia.  Able to follow 3 step commands without difficulty. Cranial Nerves: II: Discs flat bilaterally; Visual fields grossly normal, pupils equal, round, reactive to light and accommodation III,IV, VI: ptosis not present, extra-ocular motions intact bilaterally V,VII: smile symmetric, facial light touch sensation normal bilaterally VIII: hearing normal bilaterally IX,X: gag reflex present XI: bilateral shoulder shrug XII: midline tongue extension Motor: Right : Upper extremity   5/5    Left:     Upper extremity   5/5  Lower extremity   5/5     Lower extremity   5/5 Tone and bulk:normal tone throughout; no atrophy noted Sensory: Pinprick and light touch intact throughout, bilaterally   Lab Results: Basic Metabolic Panel: Recent Labs  Lab 01/18/17 0200 01/18/17 1019 01/19/17 0416  NA 139 139 137  K 3.7 3.9 3.8  CL 106 107 107  CO2 24 24 24   GLUCOSE 105* 93 81  BUN 24*  19 15  CREATININE 1.08 0.96 0.78  CALCIUM 8.9 8.6* 8.3*    Liver Function Tests: Recent Labs  Lab 01/18/17 0200 01/18/17 1019  AST 32 32  ALT 20 17  ALKPHOS 81 74  BILITOT 1.5* 2.0*  PROT 7.8 7.0  ALBUMIN 4.4 3.8   No results for input(s): LIPASE, AMYLASE in the last 168 hours. Recent Labs  Lab 01/18/17 0200  AMMONIA 41*    CBC: Recent Labs  Lab 01/18/17 1019 01/19/17 0416  WBC 5.5 5.8  NEUTROABS 3.4  --   HGB 16.2 16.1  HCT 47.0 47.9  MCV 93.4 94.4  PLT 200 190    Cardiac Enzymes: Recent Labs  Lab 01/18/17 0200 01/18/17 0620  TROPONINI <0.03 <0.03    Lipid Panel: No results for input(s): CHOL, TRIG, HDL, CHOLHDL, VLDL, LDLCALC in the last 168 hours.  CBG: Recent Labs  Lab 01/18/17 0630 01/19/17 0733  GLUCAP 103* 87    Microbiology: No results found for this or any previous visit.  Coagulation Studies: No results for input(s): LABPROT, INR in the last 72 hours.  Imaging: Ct Head Wo Contrast  Result Date: 01/18/2017 CLINICAL DATA:  Altered mental status, worsened since admission. EXAM: CT HEAD WITHOUT CONTRAST TECHNIQUE: Contiguous axial images were obtained from the base of the skull through the vertex without intravenous contrast. COMPARISON:  Earlier today. FINDINGS: Brain: Diffusely enlarged ventricles and subarachnoid  spaces. Patchy white matter low density in both cerebral hemispheres. No intracranial hemorrhage, mass lesion or CT evidence of acute infarction. Vascular: No hyperdense vessel or unexpected calcification. Skull: Normal. Negative for fracture or focal lesion. Sinuses/Orbits: Unremarkable. Other: None. IMPRESSION: 1. No acute abnormality. 2. Stable atrophy and chronic small vessel white matter ischemic changes. Electronically Signed   By: Beckie SaltsSteven  Reid M.D.   On: 01/18/2017 07:22   Ct Head Wo Contrast  Result Date: 01/18/2017 CLINICAL DATA:  Altered mental status.  Confusion. EXAM: CT HEAD WITHOUT CONTRAST TECHNIQUE: Contiguous  axial images were obtained from the base of the skull through the vertex without intravenous contrast. COMPARISON:  None. FINDINGS: Brain: Generalized cerebral atrophy. Moderate to advanced periventricular white matter changes. No intracranial hemorrhage, mass effect, or midline shift. No hydrocephalus. The basilar cisterns are patent. No evidence of territorial infarct or acute ischemia. No extra-axial or intracranial fluid collection. Vascular: No hyperdense vessel or unexpected calcification. Skull: No fracture or focal lesion. Sinuses/Orbits: Paranasal sinuses and mastoid air cells are clear. The visualized orbits are unremarkable. Other: None. IMPRESSION: 1.  No acute intracranial abnormality. 2. Mild atrophy and moderate to advanced periventricular white matter changes, typically secondary to chronic small vessel ischemia. Electronically Signed   By: Rubye OaksMelanie  Ehinger M.D.   On: 01/18/2017 04:34    Medications:  I have reviewed the patient's current medications. Scheduled: . enoxaparin (LOVENOX) injection  40 mg Subcutaneous Q24H    Assessment/Plan: Patient much improved today but suspect still not at baseline.  Concerned about ingestion.  MRI and EEG pending.     LOS: 0 days   Thana FarrLeslie Madeline Pho, MD Neurology [phone] 01/19/2017  1:55 PM

## 2017-01-19 NOTE — Clinical Social Work Note (Signed)
CSW received referral for possible SNF.  PT recommending no follow up, CSW to sign off please re-consult if social work needs arise.  Ervin KnackEric R. Tamar Lipscomb, MSW, [employer]CSWA [phone]

## 2017-01-20 ENCOUNTER — Other Ambulatory Visit: Payer: Self-pay

## 2017-01-20 DIAGNOSIS — R410 Disorientation, unspecified: Secondary | ICD-10-CM

## 2017-01-20 DIAGNOSIS — R4182 Altered mental status, unspecified: Secondary | ICD-10-CM

## 2017-01-20 LAB — URINE CULTURE: Culture: NO GROWTH

## 2017-01-20 MED ORDER — INFLUENZA VAC SPLIT HIGH-DOSE 0.5 ML IM SUSY
0.5000 mL | PREFILLED_SYRINGE | INTRAMUSCULAR | Status: AC
  Administered 2017-01-21: 0.5 mL via INTRAMUSCULAR
  Filled 2017-01-20: qty 0.5

## 2017-01-20 MED ORDER — CLONIDINE HCL 0.1 MG PO TABS
0.1000 mg | ORAL_TABLET | Freq: Two times a day (BID) | ORAL | Status: DC
  Administered 2017-01-20 – 2017-01-21 (×3): 0.1 mg via ORAL
  Filled 2017-01-20 (×3): qty 1
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
## 2017-01-20 NOTE — Progress Notes (Addendum)
[facility] - [place] [facility]   PATIENT NAME: Jeremy Christensen    MR#:  [id number]  DATE OF BIRTH:  10-14-1945  SUBJECTIVE:   Patient's mental status somewhat improved today. MRI negative for any acute pathology. Blood pressures are somewhat labile. Patient still remains confused at times.  REVIEW OF SYSTEMS:    Review of Systems  Constitutional: Negative for chills and fever.  HENT: Negative for congestion and tinnitus.   Eyes: Negative for blurred vision and double vision.  Respiratory: Negative for cough, shortness of breath and wheezing.   Cardiovascular: Negative for chest pain, orthopnea and PND.  Gastrointestinal: Negative for abdominal pain, diarrhea, nausea and vomiting.  Genitourinary: Negative for dysuria and hematuria.  Neurological: Negative for dizziness, sensory change and focal weakness.  All other systems reviewed and are negative.   Nutrition: Regular Tolerating Diet: Yes Tolerating PT: Eval noted.   DRUG ALLERGIES:   Allergies  Allergen Reactions  . Tetracyclines & Related Hives    VITALS:  Blood pressure 123/76, pulse 68, temperature 99.2 F (37.3 C), temperature source Oral, resp. rate 16, height 5\' 11"  (1.803 m), weight 107 kg (235 lb 14.4 oz), SpO2 94 %.  PHYSICAL EXAMINATION:   Physical Exam  GENERAL:  71 y.o.-year-old unkempt patient lying in bed in no acute distress.  EYES: Pupils equal, round, reactive to light and accommodation. No scleral icterus. Extraocular muscles intact.  HEENT: Head atraumatic, normocephalic. Oropharynx and nasopharynx clear.  NECK:  Supple, no jugular venous distention. No thyroid enlargement, no tenderness.  LUNGS: Normal breath sounds bilaterally, no wheezing, rales, rhonchi. No use of accessory muscles of respiration.  CARDIOVASCULAR: S1, S2 normal. No murmurs, rubs, or gallops.  ABDOMEN: Soft, nontender, nondistended. Bowel sounds present. No organomegaly or mass.  EXTREMITIES: No cyanosis,  clubbing or edema b/l.    NEUROLOGIC: Cranial nerves II through XII are intact. No focal Motor or sensory deficits b/l.   PSYCHIATRIC: The patient is alert and oriented x 1.  SKIN: No obvious rash, lesion, or ulcer.    LABORATORY PANEL:   CBC Recent Labs  Lab 01/19/17 0416  WBC 5.8  HGB 16.1  HCT 47.9  PLT 190   ------------------------------------------------------------------------------------------------------------------  Chemistries  Recent Labs  Lab 01/18/17 1019 01/19/17 0416  NA 139 137  K 3.9 3.8  CL 107 107  CO2 24 24  GLUCOSE 93 81  BUN 19 15  CREATININE 0.96 0.78  CALCIUM 8.6* 8.3*  AST 32  --   ALT 17  --   ALKPHOS 74  --   BILITOT 2.0*  --    ------------------------------------------------------------------------------------------------------------------  Cardiac Enzymes Recent Labs  Lab 01/18/17 0620  TROPONINI <0.03   ------------------------------------------------------------------------------------------------------------------  RADIOLOGY:  Dg Eye Foreign Body  Result Date: 01/19/2017 CLINICAL DATA:  MRI clearance. EXAM: ORBITS FOR FOREIGN BODY - 2 VIEW COMPARISON:  Head CT scan 06/25/2016 FINDINGS: There is no evidence of metallic foreign body within the orbits. No significant bone abnormality identified. IMPRESSION: No evidence of metallic foreign body within the orbits. Electronically Signed   By: Rudie MeyerP.  Gallerani M.D.   On: 01/19/2017 15:41   Dg Chest 2 View  Result Date: 01/19/2017 CLINICAL DATA:  MRI clearance, evaluate for foreign body. EXAM: CHEST  2 VIEW COMPARISON:  01/05/2014. FINDINGS: Unremarkable cardiomediastinal silhouette. No consolidation or edema. No osseous findings. Mild scarring or subsegmental atelectasis at the bases. No osseous findings. Similar appearance to priors. No metallic/radiopaque foreign body is observed. IMPRESSION: No active cardiopulmonary disease. No metallic/radiopaque  foreign body is observed.  Electronically Signed   By: Elsie StainJohn T Curnes M.D.   On: 01/19/2017 15:42   Dg Pelvis 1-2 Views  Result Date: 01/19/2017 CLINICAL DATA:  Clearance for MRI. EXAM: PELVIS - 1-2 VIEW COMPARISON:  None. FINDINGS: No metallic foreign bodies are identified. Mild to moderate hip joint degenerative changes. IMPRESSION: No metallic foreign bodies. Electronically Signed   By: Rudie MeyerP.  Gallerani M.D.   On: 01/19/2017 15:45   Dg Abd 1 View  Result Date: 01/19/2017 CLINICAL DATA:  MRI clearance. EXAM: ABDOMEN - 1 VIEW COMPARISON:  None FINDINGS: No metallic foreign body is identified. IMPRESSION: No metallic foreign body. Electronically Signed   By: Rudie MeyerP.  Gallerani M.D.   On: 01/19/2017 15:44   Mr Brain Wo Contrast  Result Date: 01/19/2017 CLINICAL DATA:  71 y/o  M; altered mental status and hallucinations. EXAM: MRI HEAD WITHOUT CONTRAST TECHNIQUE: Multiplanar, multiecho pulse sequences of the brain and surrounding structures were obtained without intravenous contrast. COMPARISON:  12/18/2016 CT of the head. FINDINGS: Brain: No acute infarction, hemorrhage, hydrocephalus, extra-axial collection or mass lesion. Several nonspecific foci of T2 FLAIR hyperintense signal abnormality in subcortical and periventricular white matter are compatible with moderate chronic microvascular ischemic changes for age. Mild brain parenchymal volume loss. Numerous punctate foci of susceptibility hypointensity are present within the brain compatible with sequelae of chronic microhemorrhage within largely peripheral distribution. Additionally, there is subcortical T2 hyperintense signal abnormality within the anterior temporal lobes. No lesion is present within the corpus callosum or in the posterior fossa. Vascular: Normal flow voids. Skull and upper cervical spine: Normal marrow signal. Sinuses/Orbits: Trace right mastoid air cell effusion. No significant abnormal signal of left mastoid air cells or paranasal sinuses. Orbits are unremarkable.  Other: None. IMPRESSION: 1. No acute intracranial abnormality. 2. T2 FLAIR hyperintense signal of white matter is nonspecific probably represents moderate chronic microvascular ischemic changes. Signal abnormality is present within the anterior temporal subcortical white matter which can be seen with CADASIL, but unlikely given age. No specific findings for demyelination. 3. Multiple punctate foci of chronic microhemorrhage can be seen with cerebral amyloid angiopathy, chronic hypertension, or sequelae of vasculitis/small vessel vasculopathy. Electronically Signed   By: Mitzi HansenLance  Furusawa-Stratton M.D.   On: 01/19/2017 20:12     ASSESSMENT AND PLAN:   71 year old male with no significant past medical history who presents to the hospital due to altered mental status.  1.  Altered mental status-etiology unclear but suspected to be secondary to metabolic encephalopathy of unknown source. -Urinalysis is negative, electrolytes are within normal range. Patient's urine drug screen is negative. -Patient CT head x2 have been negative.   -Appreciate nephrology input, MRI of the brain done yesterday showing no evidence of acute pathology. EEG negative for acute seizures. Mental status has been improving over the past 48 hours. Patient still not back to baseline. -I will get a psychiatric consult to assess patient's medical decision-making capacity. - Continue when necessary Haldol for agitation.  2.  Sinus bradycardia- Improved and pt. Is asymptomatic.   3. Essential hypertension- cont. Norvasc. BP still not improved and will add low dose clonidine.   Dispo pending Psych consult.   All the records are reviewed and case discussed with Care Management/Social Worker. Management plans discussed with the patient, family and they are in agreement.  CODE STATUS: Full code  DVT Prophylaxis: Lovenox  TOTAL TIME TAKING CARE OF THIS PATIENT: 30 minutes.   POSSIBLE D/C IN 1-2 DAYS, DEPENDING ON CLINICAL  CONDITION.  Houston Siren M.D on 01/20/2017 at 12:56 PM  Between 7am to 6pm - Pager - [phone]  After 6pm go to [email or web address] - Social research officer, government  [facility]  Office  [phone]  CC: Primary care physician; Patient, No Pcp Per

## 2017-01-20 NOTE — Progress Notes (Signed)
Subjective: Patient awake and alert.  Still not fully intact but improved from yesterday.    Objective: Current vital signs: BP 123/76 (BP Location: Left Arm)   Pulse 68   Temp 99.2 F (37.3 C) (Oral)   Resp 16   Ht 5\' 11"  (1.803 m)   Wt 107 kg (235 lb 14.4 oz)   SpO2 94%   BMI 32.90 kg/m  Vital signs in last 24 hours: Temp:  [98.2 F (36.8 C)-99.2 F (37.3 C)] 99.2 F (37.3 C) (11/06 0842) Pulse Rate:  [58-71] 68 (11/06 1100) Resp:  [16-18] 16 (11/06 0842) BP: (123-170)/(69-81) 123/76 (11/06 1100) SpO2:  [92 %-96 %] 94 % (11/06 0842)  Intake/Output from previous day: 11/05 0701 - 11/06 0700 In: 2700 [P.O.:600; I.V.:2100] Out: 1100 [Urine:1100] Intake/Output this shift: Total I/O In: 480 [P.O.:480] Out: -  Nutritional status: Diet Heart Room service appropriate? Yes; Fluid consistency: Thin  Neurologic Exam: Mental Status: Alert.  Knows where he is and what kind of job he does.  Initially does not know the date.  He begins to then search the room and sees the date on board.  He is then able to tell me the year but is unable to tell me anything else about the date.  Speech fluent without evidence of aphasia.  Able to follow 3 step commands without difficulty. Cranial Nerves: II: Discs flat bilaterally; Visual fields grossly normal, pupils equal, round, reactive to light and accommodation III,IV, VI: ptosis not present, extra-ocular motions intact bilaterally V,VII: smile symmetric, facial light touch sensation normal bilaterally VIII: hearing normal bilaterally IX,X: gag reflex present XI: bilateral shoulder shrug XII: midline tongue extension Motor: Right :  Upper extremity   5/5                                      Left:     Upper extremity   5/5             Lower extremity   5/5                                                  Lower extremity   5/5 Tone and bulk:normal tone throughout; no atrophy noted   Lab Results: Basic Metabolic Panel: Recent Labs  Lab  01/18/17 0200 01/18/17 1019 01/19/17 0416  NA 139 139 137  K 3.7 3.9 3.8  CL 106 107 107  CO2 24 24 24   GLUCOSE 105* 93 81  BUN 24* 19 15  CREATININE 1.08 0.96 0.78  CALCIUM 8.9 8.6* 8.3*    Liver Function Tests: Recent Labs  Lab 01/18/17 0200 01/18/17 1019  AST 32 32  ALT 20 17  ALKPHOS 81 74  BILITOT 1.5* 2.0*  PROT 7.8 7.0  ALBUMIN 4.4 3.8   No results for input(s): LIPASE, AMYLASE in the last 168 hours. Recent Labs  Lab 01/18/17 0200  AMMONIA 41*    CBC: Recent Labs  Lab 01/18/17 1019 01/19/17 0416  WBC 5.5 5.8  NEUTROABS 3.4  --   HGB 16.2 16.1  HCT 47.0 47.9  MCV 93.4 94.4  PLT 200 190    Cardiac Enzymes: Recent Labs  Lab 01/18/17 0200 01/18/17 0620  TROPONINI <0.03 <0.03    Lipid Panel: No results for  input(s): CHOL, TRIG, HDL, CHOLHDL, VLDL, LDLCALC in the last 168 hours.  CBG: Recent Labs  Lab 01/18/17 0630 01/19/17 0733  GLUCAP 103* 87    Microbiology: Results for orders placed or performed in visit on 01/18/17  Urine Culture     Status: None   Collection Time: 01/18/17  3:00 AM  Result Value Ref Range Status   Specimen Description URINE, RANDOM  Final   Special Requests NONE  Final   Culture   Final    NO GROWTH Performed at [facility], [address] [place], [place] [address]    Report Status 01/20/2017 FINAL  Final    Coagulation Studies: No results for input(s): LABPROT, INR in the last 72 hours.  Imaging: Dg Eye Foreign Body  Result Date: 01/19/2017 CLINICAL DATA:  MRI clearance. EXAM: ORBITS FOR FOREIGN BODY - 2 VIEW COMPARISON:  Head CT scan 06/25/2016 FINDINGS: There is no evidence of metallic foreign body within the orbits. No significant bone abnormality identified. IMPRESSION: No evidence of metallic foreign body within the orbits. Electronically Signed   By: Rudie MeyerP.  Gallerani M.D.   On: 01/19/2017 15:41   Dg Chest 2 View  Result Date: 01/19/2017 CLINICAL DATA:  MRI clearance, evaluate for foreign  body. EXAM: CHEST  2 VIEW COMPARISON:  01/05/2014. FINDINGS: Unremarkable cardiomediastinal silhouette. No consolidation or edema. No osseous findings. Mild scarring or subsegmental atelectasis at the bases. No osseous findings. Similar appearance to priors. No metallic/radiopaque foreign body is observed. IMPRESSION: No active cardiopulmonary disease. No metallic/radiopaque foreign body is observed. Electronically Signed   By: Elsie StainJohn T Curnes M.D.   On: 01/19/2017 15:42   Dg Pelvis 1-2 Views  Result Date: 01/19/2017 CLINICAL DATA:  Clearance for MRI. EXAM: PELVIS - 1-2 VIEW COMPARISON:  None. FINDINGS: No metallic foreign bodies are identified. Mild to moderate hip joint degenerative changes. IMPRESSION: No metallic foreign bodies. Electronically Signed   By: Rudie MeyerP.  Gallerani M.D.   On: 01/19/2017 15:45   Dg Abd 1 View  Result Date: 01/19/2017 CLINICAL DATA:  MRI clearance. EXAM: ABDOMEN - 1 VIEW COMPARISON:  None FINDINGS: No metallic foreign body is identified. IMPRESSION: No metallic foreign body. Electronically Signed   By: Rudie MeyerP.  Gallerani M.D.   On: 01/19/2017 15:44   Mr Brain Wo Contrast  Result Date: 01/19/2017 CLINICAL DATA:  71 y/o  M; altered mental status and hallucinations. EXAM: MRI HEAD WITHOUT CONTRAST TECHNIQUE: Multiplanar, multiecho pulse sequences of the brain and surrounding structures were obtained without intravenous contrast. COMPARISON:  12/18/2016 CT of the head. FINDINGS: Brain: No acute infarction, hemorrhage, hydrocephalus, extra-axial collection or mass lesion. Several nonspecific foci of T2 FLAIR hyperintense signal abnormality in subcortical and periventricular white matter are compatible with moderate chronic microvascular ischemic changes for age. Mild brain parenchymal volume loss. Numerous punctate foci of susceptibility hypointensity are present within the brain compatible with sequelae of chronic microhemorrhage within largely peripheral distribution. Additionally, there is  subcortical T2 hyperintense signal abnormality within the anterior temporal lobes. No lesion is present within the corpus callosum or in the posterior fossa. Vascular: Normal flow voids. Skull and upper cervical spine: Normal marrow signal. Sinuses/Orbits: Trace right mastoid air cell effusion. No significant abnormal signal of left mastoid air cells or paranasal sinuses. Orbits are unremarkable. Other: None. IMPRESSION: 1. No acute intracranial abnormality. 2. T2 FLAIR hyperintense signal of white matter is nonspecific probably represents moderate chronic microvascular ischemic changes. Signal abnormality is present within the anterior temporal subcortical white matter which can  be seen with CADASIL, but unlikely given age. No specific findings for demyelination. 3. Multiple punctate foci of chronic microhemorrhage can be seen with cerebral amyloid angiopathy, chronic hypertension, or sequelae of vasculitis/small vessel vasculopathy. Electronically Signed   By: Mitzi HansenLance  Furusawa-Stratton M.D.   On: 01/19/2017 20:12    Medications:  I have reviewed the patient's current medications. Scheduled: . amLODipine  5 mg Oral Daily  . cloNIDine  0.1 mg Oral BID  . enoxaparin (LOVENOX) injection  40 mg Subcutaneous Q24H    Assessment/Plan: Patient continues to improve daily.  Although improved from yesterday and concerned that this may not be the patient's baseline.  EEG reviewed and shows no abnormalities.  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  No evidence of any acute changes.  Case discussed discussed with Dr. Cherlynn KaiserSainani.  Patient may require some evaluation for determination of competency prior to discharge if he does not further improve.   LOS: 0 days   Thana FarrLeslie Tuleen Mandelbaum, MD Neurology [phone] 01/20/2017  1:38 PM

## 2017-01-20 NOTE — Clinical Social Work Note (Addendum)
Clinical Social Work Assessment  Patient Details  Name: Jeremy Christensen. MRN: [id number] Date of Birth: 11-27-1945  Date of referral:  01/20/17               Reason for consult:  Housing Concerns/Homelessness, Discharge Planning                Permission sought to share information with:    Permission granted to share information::     Name::        Agency::     Relationship::     Contact Information:     Housing/Transportation Living arrangements for the past 2 months:  Apartment Source of Information:  Patient Patient Interpreter Needed:  None Criminal Activity/Legal Involvement Pertinent to Current Situation/Hospitalization:    Significant Relationships:  None Lives with:  Self Do you feel safe going back to the place where you live?  Yes Need for family participation in patient care:  Yes (Comment)  Care giving concerns:  Patient lives in an apartment in [place] but is unsure if he will be able to return.   Social Worker assessment / plan:  Holiday representative (CSW) received consult for patient regarding living status. Social work Theatre manager met with patient alone at bedside. Patient was alert but was very confused and disoriented. Social work Theatre manager introduced herself and explained the role of the [place]. Patient was able to share that he lives in an apartment in [place] but is unsure if he will still be able to afford it because he may not have a job at [employer] anymore. Social work intern asked patient if he had anyone we could contact but he said no. Social work Theatre manager presented patient with [employer] and pointed out some of the centers that may be able to help him with housing. Patient plans to try and return to his apartment after discharge and is agreeable using some of the resources presented. Social work Theatre manager attempted to contact the people listed on the patient's chart but could not leave a Advertising account executive for [employer] and the second phone number was  the general number for [place]. CSW and social work intern will continue to follow up and assist.  CSW discussed case with MD and stated that a psych consult for capacity may be appropriate if patient's mental status does not improve.   Employment status:  [employer] information:  Self Pay (Medicaid Pending) PT Recommendations:  Not assessed at this time Information / Referral to community resources:     Patient/Family's Response to care: Patient plans to try and return home and was accepting of the resource list and using it.  Patient/Family's Understanding of and Emotional Response to Diagnosis, Current Treatment, and Prognosis: Patient was disoriented but was pleasant and thanked social work Theatre manager for her assistance.  Emotional Assessment Appearance:  Appears stated age Attitude/Demeanor/Rapport:    Affect (typically observed):  Calm, Pleasant Orientation:  Oriented to Self Alcohol / Substance use:  Not Applicable Psych involvement (Current and /or in the community):  Yes (Comment)  Discharge Needs  Concerns to be addressed:  Discharge Planning Concerns, Homelessness Readmission within the last 30 days:  No Current discharge risk:  Homeless Barriers to Discharge:  Continued Medical Work up, Homeless with medical needs   Sperl Mince, [place] Work 01/20/2017, 11:44 AM

## 2017-01-21 LAB — GLUCOSE, CAPILLARY: Glucose-Capillary: 100 mg/dL — ABNORMAL HIGH (ref 65–99)

## 2017-01-21 MED ORDER — AMLODIPINE BESYLATE 5 MG PO TABS: 5.0000 mg | ORAL_TABLET | Freq: Every day | ORAL | 0 refills | Status: DC

## 2017-01-21 NOTE — Discharge Summary (Signed)
[facility] [place] [facility]   PATIENT NAME: Jeremy LevinsWilliam Christensen    MR#:  [id number]  DATE OF BIRTH:  10-27-1945  DATE OF ADMISSION:  01/18/2017   ADMITTING PHYSICIAN: Houston SirenVivek J Sainani, MD  DATE OF DISCHARGE: 01/21/2017 12:37 PM  PRIMARY CARE PHYSICIAN: Patient, No Pcp Per   ADMISSION DIAGNOSIS:  Confusion [R41.0] Disorientation [R41.0] DISCHARGE DIAGNOSIS:  Principal Problem:   Acute delirium Active Problems:   Altered mental status   Pressure injury of skin  SECONDARY DIAGNOSIS:  History reviewed. No pertinent past medical history. HOSPITAL COURSE:  71 year old male with no significant past medical history who presents to the hospital due to altered mental status.  1. Altered mental status-etiology unclear but suspected to be secondary to metabolic encephalopathy of unknown source. -Urinalysis is negative, electrolytes are within normal range. Patient's urine drug screen is negative. -Patient CT head x2 have been negative.  -Appreciate nephrology input, MRI of the brain done yesterday showing no evidence of acute pathology. EEG negative for acute seizures. Mental status has been improving, maybe his baseline. He is on when necessary Haldol for agitation as needed, but no agitation per RN.  He did have some memory loss.  2. Sinus bradycardia- Improved and asymptomatic.   3. Essential hypertension- cont. Norvasc.   Morbid obesity.  Per Dr. Toni Amendlapacs, expect the patient can be released home without any follow-up psychiatrically no indication for any medicine.  DISCHARGE CONDITIONS:  Stable, discharged to home today. CONSULTS OBTAINED:  Treatment Team:  Thana Farreynolds, Leslie, MD Clapacs, Jackquline DenmarkJohn T, MD DRUG ALLERGIES:   Allergies  Allergen Reactions  . Tetracyclines & Related Hives   DISCHARGE MEDICATIONS:   Allergies as of 01/21/2017      Reactions   Tetracyclines & Related Hives      Medication List    TAKE these medications   amLODipine 5  MG tablet Commonly known as:  NORVASC Take 1 tablet (5 mg total) daily by mouth.        DISCHARGE INSTRUCTIONS:  See AVS.  If you experience worsening of your admission symptoms, develop shortness of breath, life threatening emergency, suicidal or homicidal thoughts you must seek medical attention immediately by calling 911 or calling your MD immediately  if symptoms less severe.  You Must read complete instructions/literature along with all the possible adverse reactions/side effects for all the Medicines you take and that have been prescribed to you. Take any new Medicines after you have completely understood and accpet all the possible adverse reactions/side effects.   Please note  You were cared for by a hospitalist during your hospital stay. If you have any questions about your discharge medications or the care you received while you were in the hospital after you are discharged, you can call the unit and asked to speak with the hospitalist on call if the hospitalist that took care of you is not available. Once you are discharged, your primary care physician will handle any further medical issues. Please note that NO REFILLS for any discharge medications will be authorized once you are discharged, as it is imperative that you return to your primary care physician (or establish a relationship with a primary care physician if you do not have one) for your aftercare needs so that they can reassess your need for medications and monitor your lab values.    On the day of Discharge:  VITAL SIGNS:  Blood pressure (!) 145/73, pulse (!) 53, temperature 98.2 F (36.8 C), temperature source Oral,  resp. rate 18, height 5\' 11"  (1.803 m), weight 235 lb 14.4 oz (107 kg), SpO2 97 %. PHYSICAL EXAMINATION:  GENERAL:  71 y.o.-year-old patient lying in the bed with no acute distress.  Morbid obesity. EYES: Pupils equal, round, reactive to light and accommodation. No scleral icterus. Extraocular muscles  intact.  HEENT: Head atraumatic, normocephalic. Oropharynx and nasopharynx clear.  NECK:  Supple, no jugular venous distention. No thyroid enlargement, no tenderness.  LUNGS: Normal breath sounds bilaterally, no wheezing, rales,rhonchi or crepitation. No use of accessory muscles of respiration.  CARDIOVASCULAR: S1, S2 normal. No murmurs, rubs, or gallops.  ABDOMEN: Soft, non-tender, non-distended. Bowel sounds present. No organomegaly or mass.  EXTREMITIES: No pedal edema, cyanosis, or clubbing.  NEUROLOGIC: Cranial nerves II through XII are intact. Muscle strength 5/5 in all extremities. Sensation intact. Gait not checked.  PSYCHIATRIC: The patient is alert and oriented x 2-3.  SKIN: No obvious rash, lesion, or ulcer.  DATA REVIEW:   CBC Recent Labs  Lab 01/19/17 0416  WBC 5.8  HGB 16.1  HCT 47.9  PLT 190    Chemistries  Recent Labs  Lab 01/18/17 1019 01/19/17 0416  NA 139 137  K 3.9 3.8  CL 107 107  CO2 24 24  GLUCOSE 93 81  BUN 19 15  CREATININE 0.96 0.78  CALCIUM 8.6* 8.3*  AST 32  --   ALT 17  --   ALKPHOS 74  --   BILITOT 2.0*  --      Microbiology Results  Results for orders placed or performed in visit on 01/18/17  Urine Culture     Status: None   Collection Time: 01/18/17  3:00 AM  Result Value Ref Range Status   Specimen Description URINE, RANDOM  Final   Special Requests NONE  Final   Culture   Final    NO GROWTH Performed at [facility], [address] [place], [place] [address]    Report Status 01/20/2017 FINAL  Final    RADIOLOGY:  No results found.   Management plans discussed with the patient, family and they are in agreement.  CODE STATUS: Full Code   TOTAL TIME TAKING CARE OF THIS PATIENT: 32 minutes.    Shaune Pollackhen, Atleigh Gruen M.D on 01/21/2017 at 2:01 PM  Between 7am to 6pm - Pager - [phone]  After 6pm go to [email or web address] - Social research officer, governmentpassword EPAS [facility]  [facility] [place] [facility]  Office  [phone]  CC: Primary care  physician; Patient, No Pcp Per   Note: This dictation was prepared with Dragon dictation along with smaller phrase technology. Any transcriptional errors that result from this process are unintentional.

## 2017-01-21 NOTE — Progress Notes (Signed)
Patient will D/C home today. Per RN patient's friend Jeremy Christensen will pick him up. Clinical Child psychotherapistocial Worker (CSW) made an Adult Management [place]rotective Services ([place] today due to his memory impairment. Please reconsult if future social work needs arise. CSW signing off.   [employer]aker Hughes IncorporatedBailey Brynn Mulgrew, LCSW [phone]

## 2017-01-21 NOTE — Progress Notes (Signed)
Chaplain rounding unit visited with pt. Pt was resting in the bed at the time of this visit. Pt stated he was mentally out of it when he arrived in hospital but was doing much better today. Pt expects to be discharged this PM. Pt requested prayers for personal spiritual reflection, which Ch offered to him.   01/21/17 1000  Clinical Encounter Type  Visited With Patient  Visit Type Initial;Spiritual support  Referral From Patient  Consult/Referral To Chaplain  Spiritual Encounters  Spiritual Needs Prayer

## 2017-01-21 NOTE — Progress Notes (Signed)
Clarified with Dr. Imogene Burnhen that pt should not be driving until follow up with neurology. Pt and friend made aware.   ArionHudson, Latricia HeftKorie G

## 2017-01-21 NOTE — Progress Notes (Signed)
Pt ready for d/c home today per MD. His friend, Babette Relicammy will be transporting him home from the hospital and picking up his prescription. Reviewed prescription and discharge instructions with pt using teach back. PIV removed, and all belongings will be sent with pt.   KemmererHudson, Latricia HeftKorie G

## 2017-01-21 NOTE — Plan of Care (Signed)
Patient is alert but confused. No signs of distress at this time. Denies pain and appears to have slept well

## 2017-01-21 NOTE — Care Management Note (Signed)
Case Management Note  Patient Details  Name: Jeremy Christensen. MRN: [id number] Date of Birth: 10-25-1945  Subjective/Objective:  Discharging today                  Action/Plan: Application given for [facility] and medication management clinic. Email sent to both agencies   Expected Discharge Date:  01/21/17               Expected Discharge Plan:  Home/Self Care  In-House Referral:     Discharge planning Services  CM Consult, Homebound not met per provider, [facility], Medication Assistance  Post Acute Care Choice:    Choice offered to:     DME Arranged:    DME Agency:     HH Arranged:    HH Agency:     Status of Service:  Completed, signed off  If discussed at [employer], dates discussed:    Additional Comments:  Jolly Mango, RN 01/21/2017, 9:49 AM

## 2017-05-15 ENCOUNTER — Other Ambulatory Visit: Payer: Self-pay | Admitting: Nurse Practitioner

## 2017-05-15 DIAGNOSIS — R413 Other amnesia: Secondary | ICD-10-CM

## 2017-05-16 ENCOUNTER — Other Ambulatory Visit: Payer: Self-pay

## 2017-05-16 ENCOUNTER — Emergency Department
Admission: EM | Admit: 2017-05-16 | Discharge: 2017-05-16 | Disposition: A | Discharge status: Home / Self Care | Payer: Medicaid Other | Attending: Emergency Medicine | Admitting: Emergency Medicine

## 2017-05-16 ENCOUNTER — Emergency Department: Payer: Medicaid Other

## 2017-05-16 DIAGNOSIS — Z79899 Other long term (current) drug therapy: Secondary | ICD-10-CM | POA: Diagnosis not present

## 2017-05-16 DIAGNOSIS — R4182 Altered mental status, unspecified: Secondary | ICD-10-CM | POA: Diagnosis present

## 2017-05-16 DIAGNOSIS — R410 Disorientation, unspecified: Secondary | ICD-10-CM | POA: Insufficient documentation

## 2017-05-16 DIAGNOSIS — F17290 Nicotine dependence, other tobacco product, uncomplicated: Secondary | ICD-10-CM | POA: Insufficient documentation

## 2017-05-16 DIAGNOSIS — I10 Essential (primary) hypertension: Secondary | ICD-10-CM | POA: Diagnosis not present

## 2017-05-16 HISTORY — DX: Essential (primary) hypertension: I10

## 2017-05-16 LAB — SALICYLATE LEVEL

## 2017-05-16 LAB — URINALYSIS, COMPLETE (UACMP) WITH MICROSCOPIC
BACTERIA UA: NONE SEEN
BILIRUBIN URINE: NEGATIVE
Glucose, UA: NEGATIVE mg/dL
Ketones, ur: 5 mg/dL — AB
Leukocytes, UA: NEGATIVE
NITRITE: NEGATIVE
PH: 6 (ref 5.0–8.0)
Protein, ur: NEGATIVE mg/dL
SPECIFIC GRAVITY, URINE: 1.006 (ref 1.005–1.030)

## 2017-05-16 LAB — CBC
HCT: 48.5 % (ref 40.0–52.0)
HEMOGLOBIN: 16.4 g/dL (ref 13.0–18.0)
MCH: 31.2 pg (ref 26.0–34.0)
MCHC: 33.9 g/dL (ref 32.0–36.0)
MCV: 92.2 fL (ref 80.0–100.0)
Platelets: 234 10*3/uL (ref 150–440)
RBC: 5.26 MIL/uL (ref 4.40–5.90)
RDW: 13.1 % (ref 11.5–14.5)
WBC: 12.5 10*3/uL — ABNORMAL HIGH (ref 3.8–10.6)

## 2017-05-16 LAB — URINE DRUG SCREEN, QUALITATIVE (ARMC ONLY)
Amphetamines, Ur Screen: NOT DETECTED
BARBITURATES, UR SCREEN: NOT DETECTED
Benzodiazepine, Ur Scrn: NOT DETECTED
CANNABINOID 50 NG, UR ~~LOC~~: NOT DETECTED
COCAINE METABOLITE, UR ~~LOC~~: NOT DETECTED
MDMA (ECSTASY) UR SCREEN: NOT DETECTED
Methadone Scn, Ur: NOT DETECTED
OPIATE, UR SCREEN: NOT DETECTED
Phencyclidine (PCP) Ur S: NOT DETECTED
Tricyclic, Ur Screen: NOT DETECTED

## 2017-05-16 LAB — ETHANOL: Alcohol, Ethyl (B): <10 mg/dL (ref <10)

## 2017-05-16 LAB — COMPREHENSIVE METABOLIC PANEL
ALK PHOS: 83 U/L (ref 38–126)
ALT: 13 U/L — ABNORMAL LOW (ref 17–63)
ANION GAP: 13 (ref 5–15)
AST: 23 U/L (ref 15–41)
Albumin: 4.6 g/dL (ref 3.5–5.0)
BUN: 25 mg/dL — ABNORMAL HIGH (ref 6–20)
CALCIUM: 9.4 mg/dL (ref 8.9–10.3)
CO2: 20 mmol/L — AB (ref 22–32)
Chloride: 105 mmol/L (ref 101–111)
Creatinine, Ser: 1.27 mg/dL — ABNORMAL HIGH (ref 0.61–1.24)
GFR calc non Af Amer: 56 mL/min — ABNORMAL LOW (ref >60)
Glucose, Bld: 109 mg/dL — ABNORMAL HIGH (ref 65–99)
Potassium: 3.6 mmol/L (ref 3.5–5.1)
Sodium: 138 mmol/L (ref 135–145)
Total Bilirubin: 1.8 mg/dL — ABNORMAL HIGH (ref 0.3–1.2)
Total Protein: 7.8 g/dL (ref 6.5–8.1)

## 2017-05-16 LAB — ACETAMINOPHEN LEVEL

## 2017-05-16 NOTE — ED Notes (Addendum)
Truddie HiddenAmanda Keys with DSS called back from the [place] [place].  She states she is On-Call for DSS, however she does not handle adults.  She is going to contact the APS and have them give us a call concerning patient.   Advised her we are trying to contact his Guardian Radio producer(Tina Reese-DSS worker).

## 2017-05-16 NOTE — ED Triage Notes (Signed)
Pt arrives IVC via [employer]. Pt from [facility]. Pt legal guardian DSS, attempting to get in touch with someone. Pt was missing 4 hrs from group home. Was walking. Pt denies SI and HI. States he walked away from group home because he thought he was going to die. Pt denies visual or auditory hallucinations.

## 2017-05-16 NOTE — ED Notes (Addendum)
Called emergency contact Darel Hong(Judy Hemphill) at [phone].  No one answered and no way to leave VM.

## 2017-05-16 NOTE — ED Notes (Signed)
Pt awake and alert but not oriented to time or place, he states that he believes that the staff at the group home are trying to poison him and this is why he walked out, calm and cooperative at this time.

## 2017-05-16 NOTE — ED Notes (Signed)
1 Bag of belongings placed in [facility] room.

## 2017-05-16 NOTE — ED Notes (Signed)
Pt sipping water and is aware that we need a urine sample as soon as he can.

## 2017-05-16 NOTE — ED Notes (Signed)
Called DSS at [phone].  No answer.  Did not leave VM.

## 2017-05-16 NOTE — ED Notes (Signed)

## 2017-05-16 NOTE — ED Notes (Signed)
Pt was able to eat his sandwich and drink some juice.

## 2017-05-16 NOTE — ED Notes (Addendum)
[name] from the Group home called to say that they will be able to pick up Mr. Jeremy Christensen if he is discharged and take him back to the group home, his number is [phone].

## 2017-05-16 NOTE — ED Notes (Signed)
Spoke with Victorino DikeJennifer [phone]) at the DSS dispatch line.  She is going to have the On-Call DSS employee call us back.

## 2017-05-16 NOTE — ED Provider Notes (Signed)
[facility] Emergency Department Provider Note   ____________________________________________    I have reviewed the triage vital signs and the nursing notes.   HISTORY  Chief Complaint Altered Mental Status     HPI Jeremy Christensen. is a 72 y.o. male who presents under IVC with concern for altered mental status.  Patient apparently ran away from his group home, he did this because "I felt something bad was can happen "and also apparently because he does not like it there.  He has no physical complaints.  No reports of drug use.  He states that when he got picked up by the police he knew he would have to go back to the group home.  Review of records demonstrates a history of admission to the hospital in November 2018 for delirium which resolved without significant intervention, normal MRI CT scans lab work at that time.  Past Medical History:  Diagnosis Date  . Hypertension     Patient Active Problem List   Diagnosis Date Noted  . Acute delirium 01/20/2017  . Altered mental status 01/18/2017  . Pressure injury of skin 01/18/2017    History reviewed. No pertinent surgical history.  Prior to Admission medications   Medication Sig Start Date End Date Taking? Authorizing Provider  amLODipine (NORVASC) 5 MG tablet Take 1 tablet (5 mg total) daily by mouth. 01/21/17   Shaune Pollack, MD     Allergies Tetracyclines & related  History reviewed. No pertinent family history.  Social History Social History   Tobacco Use  . Smoking status: Current Every Day Smoker    Types: Cigars  . Smokeless tobacco: Never Used  Substance Use Topics  . Alcohol use: No  . Drug use: Not on file    Review of Systems  Constitutional: No fever/chills Eyes: No visual changes.  ENT: No sore throat. Cardiovascular: Denies chest pain. Respiratory: Denies shortness of breath. Gastrointestinal: No abdominal pain.   Genitourinary: Negative for  dysuria. Musculoskeletal: Negative for back pain. Skin: Negative for rash. Neurological: Negative for headaches    ____________________________________________   PHYSICAL EXAM:  VITAL SIGNS: ED Triage Vitals  Enc Vitals Group     BP 05/16/17 1530 111/72     Pulse Rate 05/16/17 1529 (!) 116     Resp 05/16/17 1529 18     Temp 05/16/17 1530 97.8 F (36.6 C)     Temp Source 05/16/17 1529 Oral     SpO2 05/16/17 1529 95 %     Weight --      Height --      Head Circumference --      Peak Flow --      Pain Score 05/16/17 1529 0     Pain Loc --      Pain Edu? --      Excl. in GC? --    Constitutional: Alert. No acute distress. Pleasant and interactive Eyes: Conjunctivae are normal.  . Nose: No congestion/rhinnorhea. Mouth/Throat: Mucous membranes are moist.   Neck:  Painless ROM Cardiovascular: Normal rate, regular rhythm.  Good peripheral circulation. Respiratory: Normal respiratory effort.  No retractions. Gastrointestinal: Soft and nontender. No distention.   Genitourinary: deferred Musculoskeletal:  Warm and well perfused Neurologic:  Normal speech and language. No gross focal neurologic deficits are appreciated.  Skin:  Skin is warm, dry and intact. No rash noted. Psychiatric: Mood and affect are normal. Speech and behavior are normal.  ____________________________________________   LABS (all labs ordered are listed,  but only abnormal results are displayed)  Labs Reviewed  COMPREHENSIVE METABOLIC PANEL - Abnormal; Notable for the following components:      Result Value   CO2 20 (*)    Glucose, Bld 109 (*)    BUN 25 (*)    Creatinine, Ser 1.27 (*)    ALT 13 (*)    Total Bilirubin 1.8 (*)    GFR calc non Af Amer 56 (*)    All other components within normal limits  CBC - Abnormal; Notable for the following components:   WBC 12.5 (*)    All other components within normal limits  ETHANOL  SALICYLATE LEVEL  ACETAMINOPHEN LEVEL  URINE DRUG SCREEN, QUALITATIVE  ([facility] ONLY)  URINALYSIS, COMPLETE (UACMP) WITH MICROSCOPIC   ____________________________________________  EKG  None ____________________________________________  RADIOLOGY  None ____________________________________________   PROCEDURES  Procedure(s) performed: No  Procedures   Critical Care performed: No ____________________________________________   INITIAL IMPRESSION / ASSESSMENT AND PLAN / ED COURSE  Pertinent labs & imaging results that were available during my care of the patient were reviewed by me and considered in my medical decision making (see chart for details).  Patient presents under IVC for evaluation after running away from group home.  He has no physical complaints at this time, tachycardia noted, will check labs chest x-ray rule out infection/metabolic encephalopathy.  He appears to be at his baseline    ____________________________________________   FINAL CLINICAL IMPRESSION(S) / ED DIAGNOSES  Final diagnoses:  None        Note:  This document was prepared using Dragon voice recognition software and may include unintentional dictation errors.     Jene EveryKinner, Antasia Haider, MD 05/16/17 [phone]

## 2017-05-16 NOTE — ED Notes (Signed)
Spoke with [name] [phone])  with DSS.  She states that patient is not under guardianship at this time.  He is his own legal guardian.

## 2017-05-16 NOTE — ED Notes (Signed)
[name] from the Group home was notified and he will be coming for Mr. Jeremy Christensen, it will take him approx 45min to 1 hour to get here.

## 2017-05-16 NOTE — ED Notes (Signed)
Pt declined dinner tray at this time stating he has no appetite.

## 2017-05-16 NOTE — ED Notes (Signed)
Attempted to obtain consent for pt treatment by calling [place] DSS, went to answering machine d/t being Saturday. Called the on-call number that was provided by answering machine, no answer. Attempted to call Jeremy FallJudy Hemphill who is contact for pt, no answer. Unable to find a number for group home to call and get more information about pt.

## 2017-05-16 NOTE — ED Notes (Signed)
TTS complete 

## 2017-05-16 NOTE — BH Assessment (Addendum)
TTS attempted to make contact with Emergency Contact,Judy Hemphill [phone]), however she was not in per husband, did not leave a message due to HIPPA. Also attempted to contact Mr. [name] with group home [phone]) for collateral information, no answer, left generic voicemail. Also left message for guardian, Max Fickleina Reese, [phone].

## 2017-05-16 NOTE — BH Assessment (Signed)
Assessment Note  Jeremy Christensen. is an 72 y.o. male who was brought to [facility] ED on IVC due to walking away from his group and confusion.  Jeremy Christensen denies SI/HI and AVH.  Jeremy Christensen also denies substance use and any history of substance use.  Jeremy Christensen reports he lives in a group home where he receives adaquet and overheard staff talking about drugging him in order to control dementia. Jeremy Christensen was very confused during the assessment and a poor historian as he admits he does not "remember a lot of things, because those things are not important.".  Group home manager, Mr. Jolaine Click was also a poor historian as he states he has only had Jeremy Christensen in his care since December 2018, but does report he has a MRI appointment scheduled for 3/11.    Diagnosis: Dementia   Past Medical History:  Past Medical History:  Diagnosis Date  . Hypertension     History reviewed. No pertinent surgical history.  Family History: History reviewed. No pertinent family history.  Social History:  reports that he has been smoking cigars.  he has never used smokeless tobacco. He reports that he does not drink alcohol. His drug history is not on file.  Additional Social History:  Alcohol / Drug Use Pain Medications: See MAR Prescriptions: See MAR Over the Counter: See MAR History of alcohol / drug use?: No history of alcohol / drug abuse  CIWA: CIWA-Ar BP: (!) 148/100 Pulse Rate: (!) 107 COWS:    Allergies:  Allergies  Allergen Reactions  . Tetracyclines & Related Hives    Home Medications:  (Not in a hospital admission)  OB/GYN Status:  No LMP for male patient.  General Assessment Data Location of Assessment: [facility] ED TTS Assessment: In system Is this a Tele or Face-to-Face Assessment?: Face-to-Face Is this an Initial Assessment or a Re-assessment for this encounter?: Initial Assessment Marital status: Single Is patient pregnant?: No Pregnancy Status: No Living Arrangements: Group Home Can pt  return to current living arrangement?: Yes Admission Status: Involuntary Is patient capable of signing voluntary admission?: No(Patient has guardian) Referral Source: Self/Family/Friend Insurance type: Medicaid  Medical Screening Exam [facility] Walk-in ONLY) Medical Exam completed: Yes  Crisis Care Plan Living Arrangements: Group Home Legal Guardian: Other: Name of Psychiatrist: unknown Name of Therapist: unknown  Education Status Is patient currently in school?: No Current Grade: N/A Highest grade of school patient has completed: 10th Name of school: N/A Contact person: N/A  Risk to self with the past 6 months Suicidal Ideation: No Has patient been a risk to self within the past 6 months prior to admission? : No Suicidal Intent: No Has patient had any suicidal intent within the past 6 months prior to admission? : No Is patient at risk for suicide?: No Suicidal Plan?: No Has patient had any suicidal plan within the past 6 months prior to admission? : No Access to Means: No What has been your use of drugs/alcohol within the last 12 months?: None Previous Attempts/Gestures: No How many times?: 0 Other Self Harm Risks: None Triggers for Past Attempts: Unknown Intentional Self Injurious Behavior: None Family Suicide History: Unknown Recent stressful life event(s): Other (Comment)(Failing Memory) Persecutory voices/beliefs?: No Depression: No Depression Symptoms: Feeling worthless/self pity Substance abuse history and/or treatment for substance abuse?: No Suicide prevention information given to non-admitted patients: Not applicable  Risk to Others within the past 6 months Homicidal Ideation: No Does patient have any lifetime risk of violence toward others  beyond the six months prior to admission? : No Thoughts of Harm to Others: No Current Homicidal Intent: No Current Homicidal Plan: No Access to Homicidal Means: No Identified Victim: N/A History of harm to others?:  No Assessment of Violence: None Noted Violent Behavior Description: N/A Does patient have access to weapons?: No Criminal Charges Pending?: No Does patient have a court date: No Is patient on probation?: No  Psychosis Hallucinations: None noted Delusions: None noted  Mental Status Report Appearance/Hygiene: In scrubs Eye Contact: Fair Motor Activity: Unremarkable Speech: Unremarkable Level of Consciousness: Alert, Quiet/awake Mood: Sad, Worthless, low self-esteem Affect: Sad Anxiety Level: Moderate Thought Processes: Tangential Judgement: Partial Orientation: Situation Obsessive Compulsive Thoughts/Behaviors: None  Cognitive Functioning Concentration: Normal Memory: Recent Impaired IQ: Average Insight: Fair Impulse Control: Poor Appetite: Poor Weight Loss: 0 Weight Gain: 0 Sleep: Unable to Assess Vegetative Symptoms: None  ADLScreening [facility] Assessment Services) Patient's cognitive ability adequate to safely complete daily activities?: No(Patient was very poor historian, unable to answer questions) Patient able to express need for assistance with ADLs?: Yes Independently performs ADLs?: Yes (appropriate for developmental age)  Prior Inpatient Therapy Prior Inpatient Therapy: No Prior Therapy Facilty/Provider(s): N/A Reason for Treatment: N/A  Prior Outpatient Therapy Prior Outpatient Therapy: No Prior Therapy Facilty/Provider(s): N/A Reason for Treatment: N/A Does patient have an ACCT team?: No Does patient have Intensive In-House Services?  : No Does patient have [facility] services? : No Does patient have P4CC services?: No  ADL Screening (condition at time of admission) Patient's cognitive ability adequate to safely complete daily activities?: No(Patient was very poor historian, unable to answer questions) Is the patient deaf or have difficulty hearing?: No Does the patient have difficulty seeing, even when wearing glasses/contacts?: No Does the patient  have difficulty concentrating, remembering, or making decisions?: Yes Patient able to express need for assistance with ADLs?: Yes Does the patient have difficulty dressing or bathing?: No Independently performs ADLs?: Yes (appropriate for developmental age) Does the patient have difficulty walking or climbing stairs?: No Weakness of Legs: None Weakness of Arms/Hands: None  Home Assistive Devices/Equipment Home Assistive Devices/Equipment: None  Therapy Consults (therapy consults require a physician order) PT Evaluation Needed: No OT Evalulation Needed: No SLP Evaluation Needed: No Abuse/Neglect Assessment (Assessment to be complete while patient is alone) Abuse/Neglect Assessment Can Be Completed: Yes Physical Abuse: Denies Verbal Abuse: Denies Sexual Abuse: Denies Exploitation of patient/patient's resources: Denies Self-Neglect: Denies Values / Beliefs Cultural Requests During Hospitalization: None Spiritual Requests During Hospitalization: None Consults Spiritual Care Consult Needed: No Social Work Consult Needed: No Merchant navy officerAdvance Directives (For Healthcare) Does Patient Have a Medical Advance Directive?: No Would patient like information on creating a medical advance directive?: No - Patient declined    Additional Information 1:1 In Past 12 Months?: No CIRT Risk: No Elopement Risk: Yes(Walked away from group home today) Does patient have medical clearance?: Yes     Disposition:  Disposition Initial Assessment Completed for this Encounter: Yes Disposition of Patient: Pending Review with psychiatrist  On Site Evaluation by:   Reviewed with Physician:    Ellery PlunkErica  Heli Dino 05/16/2017 6:10 PM

## 2017-05-16 NOTE — ED Notes (Signed)
TTS at bedside. 

## 2017-05-16 NOTE — Discharge Instructions (Signed)
Patient appears at baseline in the ED. Tests are unremarkable

## 2017-05-25 ENCOUNTER — Ambulatory Visit
Admission: RE | Admit: 2017-05-25 | Discharge: 2017-05-25 | Disposition: A | Discharge status: Home / Self Care | Payer: Medicaid Other | Source: Ambulatory Visit | Attending: Nurse Practitioner | Admitting: Nurse Practitioner

## 2017-05-25 DIAGNOSIS — R413 Other amnesia: Secondary | ICD-10-CM | POA: Diagnosis present

## 2017-05-25 MED ORDER — GADOBENATE DIMEGLUMINE 529 MG/ML IV SOLN
20.0000 mL | Freq: Once | INTRAVENOUS | Status: AC | PRN
  Administered 2017-05-25: 20 mL via INTRAVENOUS

## 2017-09-03 ENCOUNTER — Ambulatory Visit: Payer: Self-pay | Admitting: Podiatry

## 2017-09-10 ENCOUNTER — Ambulatory Visit: Payer: Self-pay | Admitting: Podiatry

## 2017-09-28 ENCOUNTER — Ambulatory Visit: Payer: Self-pay | Admitting: Podiatry

## 2017-10-15 ENCOUNTER — Encounter: Payer: Self-pay | Admitting: Podiatry

## 2017-10-15 ENCOUNTER — Ambulatory Visit (INDEPENDENT_AMBULATORY_CARE_PROVIDER_SITE_OTHER): Payer: Medicaid Other | Admitting: Podiatry

## 2017-10-15 DIAGNOSIS — M79674 Pain in right toe(s): Secondary | ICD-10-CM | POA: Diagnosis not present

## 2017-10-15 DIAGNOSIS — B351 Tinea unguium: Secondary | ICD-10-CM

## 2017-10-15 DIAGNOSIS — Q828 Other specified congenital malformations of skin: Secondary | ICD-10-CM | POA: Diagnosis not present

## 2017-10-15 DIAGNOSIS — M79675 Pain in left toe(s): Secondary | ICD-10-CM | POA: Diagnosis not present

## 2017-10-15 NOTE — Progress Notes (Signed)
Complaint:  Visit Type: Patient presents  to my office for  preventative foot care services. Complaint: Patient states" my nails have grown long and thick and become painful to walk and wear shoes".  Patient also has developed painful callus both feet.  The patient presents for preventative foot care services. No changes to ROS  Podiatric Exam: Vascular: dorsalis pedis and posterior tibial pulses are palpable bilateral. Capillary return is immediate. Temperature gradient is WNL. Skin turgor WNL  Sensorium: Normal Semmes Weinstein monofilament test. Normal tactile sensation bilaterally. Nail Exam: Pt has thick disfigured discolored nails with subungual debris noted bilateral entire nail hallux through fifth toenails Ulcer Exam: There is no evidence of ulcer or pre-ulcerative changes or infection. Orthopedic Exam: Muscle tone and strength are WNL. No limitations in general ROM. No crepitus or effusions noted. Foot type and digits show no abnormalities. Bony prominences are unremarkable. Skin:  Porokeratosis. Sub 5  B/L.  No infection or ulcers  Diagnosis:  Onychomycosis, , Pain in right toe, pain in left toes  Porokeratosis  B/L  Treatment & Plan Procedures and Treatment: Consent by patient was obtained for treatment procedures.   Debridement of mycotic and hypertrophic toenails, 1 through 5 bilateral and clearing of subungual debris. No ulceration, no infection noted. Debride porokeratosis  B/L Return Visit-Office Procedure: Patient instructed to return to the office for a follow up visit 4 months for continued evaluation and treatment.    Helane GuntherGregory Dominga Mcduffie DPM

## 2018-02-18 ENCOUNTER — Ambulatory Visit: Payer: Medicaid Other | Admitting: Podiatry

## 2019-07-04 ENCOUNTER — Other Ambulatory Visit: Payer: Self-pay | Admitting: Physician Assistant

## 2019-07-04 ENCOUNTER — Ambulatory Visit
Admission: RE | Admit: 2019-07-04 | Discharge: 2019-07-04 | Disposition: A | Discharge status: Home / Self Care | Payer: Medicare Other | Source: Ambulatory Visit | Attending: Physician Assistant | Admitting: Physician Assistant

## 2019-07-04 ENCOUNTER — Other Ambulatory Visit: Payer: Self-pay

## 2019-07-04 DIAGNOSIS — S52611A Displaced fracture of right ulna styloid process, initial encounter for closed fracture: Secondary | ICD-10-CM | POA: Diagnosis not present

## 2019-07-04 DIAGNOSIS — M25531 Pain in right wrist: Secondary | ICD-10-CM

## 2019-07-04 DIAGNOSIS — M25431 Effusion, right wrist: Secondary | ICD-10-CM | POA: Diagnosis not present

## 2019-07-04 DIAGNOSIS — W19XXXA Unspecified fall, initial encounter: Secondary | ICD-10-CM

## 2019-07-29 ENCOUNTER — Other Ambulatory Visit: Payer: Self-pay

## 2019-07-29 ENCOUNTER — Encounter: Payer: Self-pay | Admitting: Emergency Medicine

## 2019-07-29 ENCOUNTER — Emergency Department
Admission: EM | Admit: 2019-07-29 | Discharge: 2019-07-29 | Disposition: A | Discharge status: Home / Self Care | Payer: Medicare Other | Attending: Emergency Medicine | Admitting: Emergency Medicine

## 2019-07-29 DIAGNOSIS — R569 Unspecified convulsions: Secondary | ICD-10-CM | POA: Diagnosis not present

## 2019-07-29 DIAGNOSIS — I10 Essential (primary) hypertension: Secondary | ICD-10-CM | POA: Diagnosis not present

## 2019-07-29 DIAGNOSIS — G40909 Epilepsy, unspecified, not intractable, without status epilepticus: Secondary | ICD-10-CM | POA: Diagnosis not present

## 2019-07-29 DIAGNOSIS — F0390 Unspecified dementia without behavioral disturbance: Secondary | ICD-10-CM | POA: Insufficient documentation

## 2019-07-29 DIAGNOSIS — F17290 Nicotine dependence, other tobacco product, uncomplicated: Secondary | ICD-10-CM | POA: Insufficient documentation

## 2019-07-29 DIAGNOSIS — Z79899 Other long term (current) drug therapy: Secondary | ICD-10-CM | POA: Diagnosis not present

## 2019-07-29 HISTORY — DX: Unspecified convulsions: R56.9

## 2019-07-29 LAB — CBC WITH DIFFERENTIAL/PLATELET
Abs Immature Granulocytes: 0.03 10*3/uL (ref 0.00–0.07)
Basophils Absolute: 0.1 10*3/uL (ref 0.0–0.1)
Basophils Relative: 1 %
Eosinophils Absolute: 0.1 10*3/uL (ref 0.0–0.5)
Eosinophils Relative: 1 %
HCT: 47.1 % (ref 39.0–52.0)
Hemoglobin: 16.1 g/dL (ref 13.0–17.0)
Immature Granulocytes: 0 %
Lymphocytes Relative: 27 %
Lymphs Abs: 2.9 10*3/uL (ref 0.7–4.0)
MCH: 31.6 pg (ref 26.0–34.0)
MCHC: 34.2 g/dL (ref 30.0–36.0)
MCV: 92.5 fL (ref 80.0–100.0)
Monocytes Absolute: 0.8 10*3/uL (ref 0.1–1.0)
Monocytes Relative: 7 %
Neutro Abs: 6.8 10*3/uL (ref 1.7–7.7)
Neutrophils Relative %: 64 %
Platelets: 247 10*3/uL (ref 150–400)
RBC: 5.09 MIL/uL (ref 4.22–5.81)
RDW: 12.3 % (ref 11.5–15.5)
WBC: 10.6 10*3/uL — ABNORMAL HIGH (ref 4.0–10.5)
nRBC: 0 % (ref 0.0–0.2)

## 2019-07-29 LAB — COMPREHENSIVE METABOLIC PANEL
ALT: 11 U/L (ref 0–44)
AST: 15 U/L (ref 15–41)
Albumin: 4.3 g/dL (ref 3.5–5.0)
Alkaline Phosphatase: 102 U/L (ref 38–126)
Anion gap: 8 (ref 5–15)
BUN: 18 mg/dL (ref 8–23)
CO2: 28 mmol/L (ref 22–32)
Calcium: 9 mg/dL (ref 8.9–10.3)
Chloride: 104 mmol/L (ref 98–111)
Creatinine, Ser: 1.14 mg/dL (ref 0.61–1.24)
GFR calc Af Amer: >60 mL/min (ref >60)
GFR calc non Af Amer: >60 mL/min (ref >60)
Glucose, Bld: 115 mg/dL — ABNORMAL HIGH (ref 70–99)
Potassium: 3.3 mmol/L — ABNORMAL LOW (ref 3.5–5.1)
Sodium: 140 mmol/L (ref 135–145)
Total Bilirubin: 1.1 mg/dL (ref 0.3–1.2)
Total Protein: 7.3 g/dL (ref 6.5–8.1)

## 2019-07-29 LAB — URINALYSIS, COMPLETE (UACMP) WITH MICROSCOPIC
Bacteria, UA: NONE SEEN
Bilirubin Urine: NEGATIVE
Glucose, UA: NEGATIVE mg/dL
Hgb urine dipstick: NEGATIVE
Ketones, ur: NEGATIVE mg/dL
Leukocytes,Ua: NEGATIVE
Nitrite: NEGATIVE
Protein, ur: 100 mg/dL — AB
Specific Gravity, Urine: 1.025 (ref 1.005–1.030)
pH: 5 (ref 5.0–8.0)

## 2019-07-29 LAB — GLUCOSE, CAPILLARY: Glucose-Capillary: 115 mg/dL — ABNORMAL HIGH (ref 70–99)

## 2019-07-29 MED ORDER — SODIUM CHLORIDE 0.9 % IV BOLUS: 1000.0000 mL | Freq: Once | INTRAVENOUS | Status: DC

## 2019-07-29 MED ORDER — LEVETIRACETAM 500 MG PO TABS
500.0000 mg | ORAL_TABLET | Freq: Once | ORAL | Status: AC
  Administered 2019-07-29: 500 mg via ORAL
  Filled 2019-07-29: qty 1

## 2019-07-29 NOTE — Discharge Instructions (Addendum)
You had a seizure and was given an extra 500 mg of Keppra.  You still need to take your nighttime dose of Keppra.  Your labs and urinalysis were unremarkable today.  See your neurologist next week.  Return to ER if you have another seizure, lethargy.

## 2019-07-29 NOTE — ED Notes (Signed)
Patient sleeping in bed with eyes closed. Even, unlabored respirations noted.

## 2019-07-29 NOTE — ED Notes (Signed)
Patient removed all leads and IV. Walking around in hospital. Patient is alert to self and place. Not sure of time and does not remember seizure. Patient redirected back to room and sitting in bed. Given crackers and peanut butter and cola.

## 2019-07-29 NOTE — ED Notes (Signed)
Patient remains in room. Given lunch tray. No further needs expressed.

## 2019-07-29 NOTE — ED Provider Notes (Signed)
[facility] Emergency Department Provider Note  ____________________________________________  Time seen: Approximately 3:18 PM  I have reviewed the triage vital signs and the nursing notes.   HISTORY  Chief Complaint Seizures and Altered Mental Status    Level 5 Caveat: Portions of the History and Physical including HPI and review of systems are unable to be completely obtained due to patient being a poor historian   HPI Jeremy Christensen. is a 74 y.o. male with a history of hypertension and seizures who was in his neurology clinic for routine follow-up when he had a seizure.  He was brought to the ED for evaluation.  Family care home caregiver at bedside notes that the patient has been his usual self lately, eating and drinking normally, normal activity, no acute symptoms.  No suspicion of illness or infection.  No falls or trauma.   Patient denies any pain   Past Medical History:  Diagnosis Date  . Hypertension   . Seizures [facility])      Patient Active Problem List   Diagnosis Date Noted  . Acute delirium 01/20/2017  . Altered mental status 01/18/2017  . Pressure injury of skin 01/18/2017     History reviewed. No pertinent surgical history.   Prior to Admission medications   Medication Sig Start Date End Date Taking? Authorizing Provider  Amantadine HCl 100 MG tablet Take 100 mg by mouth. 1/2 tablet Twice Daily   Yes [provider]  ARIPiprazole (ABILIFY) 10 MG tablet Take 10 mg by mouth at bedtime.    Yes [provider]  donepezil (ARICEPT) 10 MG tablet Take 10 mg by mouth at bedtime.  06/26/17 07/29/19 Yes [provider]  levETIRAcetam (KEPPRA) 500 MG tablet Take 1,500 mg by mouth 2 (two) times daily.  07/08/19  Yes [provider]  tamsulosin (FLOMAX) 0.4 MG CAPS capsule Take 0.4 mg by mouth at bedtime.    Yes [provider]  traZODone (DESYREL) 50 MG tablet Take 50 mg by mouth at bedtime.     Yes [provider]  acetaminophen (TYLENOL) 325 MG tablet Take by mouth.    [provider]  aluminum-magnesium hydroxide-simethicone (MAALOX) 338-250-53 MG/5ML SUSP Take by mouth.    [provider]  ciprofloxacin (CIPRO) 250 MG tablet Take 250 mg by mouth 2 (two) times daily.    [provider]  Dextromethorphan-guaiFENesin 10-100 MG/5ML SOLN Take by mouth.    [provider]  loperamide (IMODIUM) 2 MG capsule Take by mouth.    [provider]  magnesium hydroxide (PHILLIPS MILK OF MAGNESIA) 400 MG/5ML suspension Take by mouth.    [provider]     Allergies Tetracyclines & related   No family history on file.  Social History Social History   Tobacco Use  . Smoking status: Current Every Day Smoker    Types: Cigars  . Smokeless tobacco: Never Used  Substance Use Topics  . Alcohol use: No  . Drug use: Not on file    Review of Systems Level 5 Caveat: Portions of the History and Physical including HPI and review of systems are unable to be completely obtained due to patient being a poor historian   Constitutional:   No known fever.  ENT:   No rhinorrhea. Cardiovascular:   No chest pain or syncope. Respiratory:   No dyspnea or cough. Gastrointestinal:   Negative for abdominal pain, vomiting and diarrhea.  Musculoskeletal:   Negative for focal pain or swelling ____________________________________________  PHYSICAL EXAM:  VITAL SIGNS: ED Triage Vitals  Enc Vitals Group     BP 07/29/19 1103 91/62     Pulse Rate 07/29/19 1102 (!) 46     Resp 07/29/19 1102 19     Temp 07/29/19 1102 (!) 96.7 F (35.9 C)     Temp Source 07/29/19 1102 Axillary     SpO2 07/29/19 1102 99 %     Weight 07/29/19 1101 235 lb 14.3 oz (107 kg)     Height 07/29/19 1101 5\' 11"  (1.803 m)     Head Circumference --      Peak Flow --      Pain Score --      Pain Loc --      Pain Edu? --      Excl. in GC? --     Vital signs  reviewed, nursing assessments reviewed.   Constitutional:   Alert and oriented to person and place. Non-toxic appearance. Eyes:   Conjunctivae are normal. EOMI. PERRL. ENT      Head:   Normocephalic and atraumatic.      Nose:   No congestion/rhinnorhea.       Mouth/Throat:   MMM, no pharyngeal erythema. No peritonsillar mass.       Neck:   No meningismus. Full ROM. Hematological/Lymphatic/Immunilogical:   No cervical lymphadenopathy. Cardiovascular:   RRR. Symmetric bilateral radial and DP pulses.  No murmurs. Cap refill less than 2 seconds. Respiratory:   Normal respiratory effort without tachypnea/retractions. Breath sounds are clear and equal bilaterally. No wheezes/rales/rhonchi. Gastrointestinal:   Soft and nontender. Non distended. There is no CVA tenderness.  No rebound, rigidity, or guarding. Musculoskeletal:   Normal range of motion in all extremities. No joint effusions.  No lower extremity tenderness.  No edema. Neurologic:   Normal speech and language.  Motor grossly intact. No acute focal neurologic deficits are appreciated.  Skin:    Skin is warm, dry and intact. No rash noted.  No petechiae, purpura, or bullae.  ____________________________________________    LABS (pertinent positives/negatives) (all labs ordered are listed, but only abnormal results are displayed) Labs Reviewed  GLUCOSE, CAPILLARY - Abnormal; Notable for the following components:      Result Value   Glucose-Capillary 115 (*)    All other components within normal limits  CBC WITH DIFFERENTIAL/PLATELET - Abnormal; Notable for the following components:   WBC 10.6 (*)    All other components within normal limits  COMPREHENSIVE METABOLIC PANEL - Abnormal; Notable for the following components:   Potassium 3.3 (*)    Glucose, Bld 115 (*)    All other components within normal limits  URINALYSIS, COMPLETE (UACMP) WITH MICROSCOPIC    ____________________________________________   EKG    ____________________________________________    RADIOLOGY  No results found.  ____________________________________________   PROCEDURES Procedures  ____________________________________________    CLINICAL IMPRESSION / ASSESSMENT AND PLAN / ED COURSE  Medications ordered in the ED: Medications  levETIRAcetam (KEPPRA) tablet 500 mg (has no administration in time range)    Pertinent labs & imaging results that were available during my care of the patient were reviewed by me and considered in my medical decision making (see chart for details).   . was evaluated in Emergency Department on 07/29/2019 for the symptoms described in the history of present illness. He was evaluated in the context of the global COVID-19 pandemic, which necessitated consideration that the patient might be at risk for infection with the SARS-CoV-2 virus that  causes COVID-19. Institutional protocols and algorithms that pertain to the evaluation of patients at risk for COVID-19 are in a state of rapid change based on information released by regulatory bodies including the CDC and federal and state organizations. These policies and algorithms were followed during the patient's care in the ED.   Patient brought to ED with a seizure in the setting of known seizure disorder.  Family care home who reports compliance with all medications.  No acute symptoms.  Exam is normal, vital signs unremarkable.  Labs obtained which are unremarkable, no evidence of leukocytosis or anemia, electrolytes are normal, creatinine at baseline.  Plan to check urinalysis for signs of UTI which may provoke seizure, antibiotics as indicated otherwise patient is stable for discharge back to family care home to follow-up with his PCP and neurologist.      ____________________________________________   FINAL CLINICAL IMPRESSION(S) / ED  DIAGNOSES    Final diagnoses:  Seizure (HCC)  Seizure disorder (HCC)  Dementia without behavioral disturbance, unspecified dementia type [facility])     ED Discharge Orders    None      Portions of this note were generated with dragon dictation software. Dictation errors may occur despite best attempts at proofreading.   Sharman Cheek, MD 07/29/19 1521

## 2019-07-29 NOTE — ED Triage Notes (Addendum)
Pt presents to ED via wheelchair from [facility] with caregiver from [facility], upon arrival in wheelchair pt noted to be slumped in wheelchair with eyes open however no further responses. Caregiver reports pt had bloodwork done at [facility], after approx 15 mins pt had seizure and was unresponsive after seizure. Pt immediately brought to room 24 by this RN. Marylene Land Charge RN made aware and to bedside with Lorin Picket, EDT.

## 2019-07-29 NOTE — ED Provider Notes (Signed)
  Physical Exam  BP 121/64   Pulse (!) 50   Temp (!) 96.7 F (35.9 C) (Axillary)   Resp (!) 23   Ht 5\' 11"  (1.803 m)   Wt 107 kg   SpO2 100%   BMI 32.90 kg/m   Physical Exam  ED Course/Procedures     Procedures  MDM  Care assumed at 3 PM.  Patient has a history of seizure and apparently had a seizure in neurology office.  Patient did take his home dose of Keppra this morning.  He is back to baseline on arrival and labs were unremarkable.  Signout pending reassessment after getting his Keppra dose as well as urinalysis.  6:25 PM UA unremarkable.  Patient has been here for 7 hours had no seizure activity.  He was given extra dose of his Keppra.  Stable for discharge home.    , MD 07/29/19 [phone]

## 2019-07-29 NOTE — ED Notes (Signed)
Patient up walking around room with steady gait and no distress. Patient attempted to urinate in toilet but was unable. Agreeable to have in and out cath done at this time.

## 2019-07-29 NOTE — ED Notes (Signed)
Patient with large, brown bowel movement in commode. Not able to catch urine specimen. Hats placed in toilet. Patient refusing in and out cath at this time.

## 2019-10-08 ENCOUNTER — Observation Stay
Admission: EM | Admit: 2019-10-08 | Discharge: 2019-10-09 | Disposition: A | Discharge status: Home / Self Care | Payer: Medicare Other | Attending: Internal Medicine | Admitting: Internal Medicine

## 2019-10-08 ENCOUNTER — Emergency Department: Payer: Medicare Other

## 2019-10-08 ENCOUNTER — Other Ambulatory Visit: Payer: Self-pay

## 2019-10-08 ENCOUNTER — Encounter: Payer: Self-pay | Admitting: Emergency Medicine

## 2019-10-08 DIAGNOSIS — F17290 Nicotine dependence, other tobacco product, uncomplicated: Secondary | ICD-10-CM | POA: Insufficient documentation

## 2019-10-08 DIAGNOSIS — R4182 Altered mental status, unspecified: Principal | ICD-10-CM | POA: Insufficient documentation

## 2019-10-08 DIAGNOSIS — R748 Abnormal levels of other serum enzymes: Secondary | ICD-10-CM | POA: Insufficient documentation

## 2019-10-08 DIAGNOSIS — R410 Disorientation, unspecified: Secondary | ICD-10-CM

## 2019-10-08 DIAGNOSIS — F03918 Unspecified dementia, unspecified severity, with other behavioral disturbance: Secondary | ICD-10-CM

## 2019-10-08 DIAGNOSIS — G40909 Epilepsy, unspecified, not intractable, without status epilepticus: Secondary | ICD-10-CM

## 2019-10-08 DIAGNOSIS — Z79899 Other long term (current) drug therapy: Secondary | ICD-10-CM | POA: Diagnosis not present

## 2019-10-08 DIAGNOSIS — F0391 Unspecified dementia with behavioral disturbance: Secondary | ICD-10-CM | POA: Diagnosis not present

## 2019-10-08 DIAGNOSIS — R778 Other specified abnormalities of plasma proteins: Secondary | ICD-10-CM

## 2019-10-08 DIAGNOSIS — I10 Essential (primary) hypertension: Secondary | ICD-10-CM | POA: Diagnosis not present

## 2019-10-08 DIAGNOSIS — Z20822 Contact with and (suspected) exposure to covid-19: Secondary | ICD-10-CM | POA: Diagnosis not present

## 2019-10-08 DIAGNOSIS — R7989 Other specified abnormal findings of blood chemistry: Secondary | ICD-10-CM

## 2019-10-08 LAB — TROPONIN I (HIGH SENSITIVITY)
Troponin I (High Sensitivity): 25 ng/L — ABNORMAL HIGH (ref <18)
Troponin I (High Sensitivity): 45 ng/L — ABNORMAL HIGH (ref <18)

## 2019-10-08 LAB — COMPREHENSIVE METABOLIC PANEL
ALT: 12 U/L (ref 0–44)
AST: 18 U/L (ref 15–41)
Albumin: 4.3 g/dL (ref 3.5–5.0)
Alkaline Phosphatase: 102 U/L (ref 38–126)
Anion gap: 10 (ref 5–15)
BUN: 25 mg/dL — ABNORMAL HIGH (ref 8–23)
CO2: 25 mmol/L (ref 22–32)
Calcium: 8.9 mg/dL (ref 8.9–10.3)
Chloride: 105 mmol/L (ref 98–111)
Creatinine, Ser: 1.23 mg/dL (ref 0.61–1.24)
GFR calc Af Amer: >60 mL/min (ref >60)
GFR calc non Af Amer: 58 mL/min — ABNORMAL LOW (ref >60)
Glucose, Bld: 95 mg/dL (ref 70–99)
Potassium: 4.2 mmol/L (ref 3.5–5.1)
Sodium: 140 mmol/L (ref 135–145)
Total Bilirubin: 1.6 mg/dL — ABNORMAL HIGH (ref 0.3–1.2)
Total Protein: 7.1 g/dL (ref 6.5–8.1)

## 2019-10-08 LAB — URINALYSIS, COMPLETE (UACMP) WITH MICROSCOPIC
Bacteria, UA: NONE SEEN
Bilirubin Urine: NEGATIVE
Glucose, UA: NEGATIVE mg/dL
Ketones, ur: 5 mg/dL — AB
Leukocytes,Ua: NEGATIVE
Nitrite: NEGATIVE
Protein, ur: NEGATIVE mg/dL
Specific Gravity, Urine: 1.021 (ref 1.005–1.030)
Squamous Epithelial / HPF: NONE SEEN (ref 0–5)
pH: 6 (ref 5.0–8.0)

## 2019-10-08 LAB — CBC
HCT: 43 % (ref 39.0–52.0)
Hemoglobin: 15 g/dL (ref 13.0–17.0)
MCH: 33.3 pg (ref 26.0–34.0)
MCHC: 34.9 g/dL (ref 30.0–36.0)
MCV: 95.6 fL (ref 80.0–100.0)
Platelets: 193 10*3/uL (ref 150–400)
RBC: 4.5 MIL/uL (ref 4.22–5.81)
RDW: 12.7 % (ref 11.5–15.5)
WBC: 8.7 10*3/uL (ref 4.0–10.5)
nRBC: 0 % (ref 0.0–0.2)

## 2019-10-08 LAB — LACTIC ACID, PLASMA: Lactic Acid, Venous: 1 mmol/L (ref 0.5–1.9)

## 2019-10-08 NOTE — ED Triage Notes (Signed)
Pt to ER from care home where caregiver states she noted pt to be more confused than normal.  Pt has dementia at baseline.  Pt has hx of UTI last approximately 6 months ago.

## 2019-10-08 NOTE — ED Notes (Signed)
Pt continuously attempting to get out of bed. Pt able to be redirected but attempts to get out of bed shortly after. Pt is a high fall risk.   Charge RN aware.

## 2019-10-08 NOTE — ED Provider Notes (Signed)
[facility] Emergency Department Provider Note ____________________________________________   First MD Initiated Contact with Patient 10/08/19 1654     (approximate)  I have reviewed the triage vital signs and the nursing notes.   HISTORY  Chief Complaint Altered Mental Status  Level 5 caveat: History of present illness limited due to dementia  HPI Jeremy Christensen. is a 74 y.o. male with PMH as noted below who presents with worsened altered mental status and possible fever.  The patient himself is unable to give any history.  Past Medical History:  Diagnosis Date  . Hypertension   . Seizures [facility])     Patient Active Problem List   Diagnosis Date Noted  . Elevated troponin 10/08/2019  . Essential hypertension 10/08/2019  . Acute confusion 01/20/2017  . Altered mental status 01/18/2017  . Pressure injury of skin 01/18/2017    History reviewed. No pertinent surgical history.  Prior to Admission medications   Medication Sig Start Date End Date Taking? Authorizing Provider  acetaminophen (TYLENOL) 325 MG tablet Take by mouth.    [provider]  aluminum-magnesium hydroxide-simethicone (MAALOX) 200-200-20 MG/5ML SUSP Take by mouth.    [provider]  Amantadine HCl 100 MG tablet Take 100 mg by mouth. 1/2 tablet Twice Daily    [provider]  ARIPiprazole (ABILIFY) 10 MG tablet Take 10 mg by mouth at bedtime.     [provider]  ciprofloxacin (CIPRO) 250 MG tablet Take 250 mg by mouth 2 (two) times daily.    [provider]  Dextromethorphan-guaiFENesin 10-100 MG/5ML SOLN Take by mouth.    [provider]  donepezil (ARICEPT) 10 MG tablet Take 10 mg by mouth at bedtime.  06/26/17 07/29/19  [provider]  levETIRAcetam (KEPPRA) 500 MG tablet Take 1,500 mg by mouth 2 (two) times daily.  07/08/19   [provider]  loperamide (IMODIUM) 2 MG capsule Take by mouth.     [provider]  magnesium hydroxide (PHILLIPS MILK OF MAGNESIA) 400 MG/5ML suspension Take by mouth.    [provider]  tamsulosin (FLOMAX) 0.4 MG CAPS capsule Take 0.4 mg by mouth at bedtime.     [provider]  traZODone (DESYREL) 50 MG tablet Take 50 mg by mouth at bedtime.     [provider]    Allergies Tetracyclines & related  History reviewed. No pertinent family history.  Social History Social History   Tobacco Use  . Smoking status: Current Every Day Smoker    Types: Cigars  . Smokeless tobacco: Never Used  Substance Use Topics  . Alcohol use: No  . Drug use: Not on file    Review of Systems Level 5 caveat: Unable to obtain review of systems due to dementia    ____________________________________________   PHYSICAL EXAM:  VITAL SIGNS: ED Triage Vitals  Enc Vitals Group     BP 10/08/19 1658 (!) 105/89     Pulse Rate 10/08/19 1658 72     Resp 10/08/19 1658 18     Temp 10/08/19 1658 99.5 F (37.5 C)     Temp Source 10/08/19 1658 Oral     SpO2 10/08/19 1658 96 %     Weight 10/08/19 1659 (!) 235 lb 14.3 oz (107 kg)     Height 10/08/19 1659 5\' 11"  (1.803 m)     Head Circumference --      Peak Flow --      Pain Score 10/08/19 1659 0  Pain Loc --      Pain Edu? --      Excl. in GC? --     Constitutional: Alert, confused.  Comfortable appearing.  No acute distress. Eyes: Conjunctivae are normal.  Head: Atraumatic. Nose: No congestion/rhinnorhea. Mouth/Throat: Mucous membranes are moist.   Neck: Normal range of motion.  Cardiovascular: Normal rate, regular rhythm. Grossly normal heart sounds.  Good peripheral circulation. Respiratory: Normal respiratory effort.  No retractions. Lungs CTAB. Gastrointestinal: Soft and nontender. No distention.  Genitourinary: No flank tenderness. Musculoskeletal: No lower extremity edema.  Extremities warm and well perfused.  Neurologic: Motor intact in all extremities.  Normal  speech. Skin:  Skin is warm and dry. No rash noted. Psychiatric: Calm and cooperative.  ____________________________________________   LABS (all labs ordered are listed, but only abnormal results are displayed)  Labs Reviewed  COMPREHENSIVE METABOLIC PANEL - Abnormal; Notable for the following components:      Result Value   BUN 25 (*)    Total Bilirubin 1.6 (*)    GFR calc non Af Amer 58 (*)    All other components within normal limits  URINALYSIS, COMPLETE (UACMP) WITH MICROSCOPIC - Abnormal; Notable for the following components:   Color, Urine YELLOW (*)    APPearance CLEAR (*)    Hgb urine dipstick SMALL (*)    Ketones, ur 5 (*)    All other components within normal limits  TROPONIN I (HIGH SENSITIVITY) - Abnormal; Notable for the following components:   Troponin I (High Sensitivity) 25 (*)    All other components within normal limits  TROPONIN I (HIGH SENSITIVITY) - Abnormal; Notable for the following components:   Troponin I (High Sensitivity) 45 (*)    All other components within normal limits  SARS CORONAVIRUS 2 BY RT PCR (HOSPITAL ORDER, PERFORMED IN [facility])  CBC  LACTIC ACID, PLASMA   ____________________________________________  EKG  ED ECG REPORT I, Dionne Bucy, the attending physician, personally viewed and interpreted this ECG.  Date: 10/08/2019 EKG Time: 1652 Rate: 78 Rhythm: normal sinus rhythm QRS Axis: normal Intervals: Prolonged PR, LAFB ST/T Wave abnormalities: Nonspecific ST abnormalities Narrative Interpretation: Nonspecific abnormalities with no evidence of acute ischemia   ____________________________________________  RADIOLOGY  CXR: No focal infiltrate or edema  ____________________________________________   PROCEDURES  Procedure(s) performed: No  Procedures  Critical Care performed: No ____________________________________________   INITIAL IMPRESSION / ASSESSMENT AND PLAN / ED COURSE  Pertinent  labs & imaging results that were available during my care of the patient were reviewed by me and considered in my medical decision making (see chart for details).  74 year old male with a history of dementia and PMH as noted above presents with increased confusion and possible fever.  The patient himself appears confused and cannot give any history.  I reviewed the past medical records in Epic.  The patient was last seen in the ED in May after a seizure at the neurology office.  He has not been admitted within the last few years.  On exam currently, the patient is alert but confused.  His vital signs are normal except for borderline elevated temperature.  Neurologic exam is nonfocal.  There is no evidence of trauma.  The exam is otherwise as described above.  Differential includes UTI or other infection, dehydration or other metabolic etiology, AKI, or less likely cardiac cause.  There are no focal neurologic deficits to suggest CNS etiology.  We will obtain chest x-ray, lab work-up, urinalysis, and reassess.  -----------------------------------------  11:19 PM on 10/08/2019 -----------------------------------------  Lab work-up is unremarkable except for slightly elevated initial troponin.  CT head is negative.  Initially thought that the patient might be appropriate for discharge home, however repeat troponin has had a significant rise.  The patient has no CAD history.  However his troponin will need to be trended.  I discussed his case with Dr. Para March from the hospitalist service for admission.  ____________________________________________   FINAL CLINICAL IMPRESSION(S) / ED DIAGNOSES  Final diagnoses:  Altered mental status, unspecified altered mental status type  Elevated troponin      NEW MEDICATIONS STARTED DURING THIS VISIT:  New Prescriptions   No medications on file     Note:  This document was prepared using Dragon voice recognition software and may include unintentional  dictation errors.    Dionne Bucy, MD 10/08/19 2320

## 2019-10-08 NOTE — ED Notes (Signed)
EDP informed of Troponin of 45.

## 2019-10-08 NOTE — ED Notes (Signed)
Banker, in and out cathed pt with this RN and [place], [place]. Sterility was kept by Marthann Schiller RN during the catheterization. of urine obtained. Urine sample sent to lab by this RN.  Pt given warm blanket, pt tolerated well. Sitter bedside.

## 2019-10-09 DIAGNOSIS — R410 Disorientation, unspecified: Secondary | ICD-10-CM

## 2019-10-09 DIAGNOSIS — G40909 Epilepsy, unspecified, not intractable, without status epilepticus: Secondary | ICD-10-CM

## 2019-10-09 DIAGNOSIS — F03918 Unspecified dementia, unspecified severity, with other behavioral disturbance: Secondary | ICD-10-CM

## 2019-10-09 LAB — SARS CORONAVIRUS 2 BY RT PCR (HOSPITAL ORDER, PERFORMED IN ~~LOC~~ HOSPITAL LAB): SARS Coronavirus 2: NEGATIVE

## 2019-10-09 LAB — TROPONIN I (HIGH SENSITIVITY): Troponin I (High Sensitivity): 40 ng/L — ABNORMAL HIGH (ref <18)

## 2019-10-09 MED ORDER — ACETAMINOPHEN 325 MG PO TABS: 650.0000 mg | ORAL_TABLET | Freq: Four times a day (QID) | ORAL | Status: DC | PRN

## 2019-10-09 MED ORDER — TAMSULOSIN HCL 0.4 MG PO CAPS: 0.4000 mg | ORAL_CAPSULE | Freq: Every day | ORAL | Status: DC

## 2019-10-09 MED ORDER — DONEPEZIL HCL 5 MG PO TABS
10.0000 mg | ORAL_TABLET | Freq: Every day | ORAL | Status: DC
  Administered 2019-10-09: 10 mg via ORAL
  Filled 2019-10-09 (×2): qty 2

## 2019-10-09 MED ORDER — ONDANSETRON HCL 4 MG/2ML IJ SOLN: 4.0000 mg | Freq: Four times a day (QID) | INTRAMUSCULAR | Status: DC | PRN

## 2019-10-09 MED ORDER — AMANTADINE HCL 100 MG PO CAPS
100.0000 mg | ORAL_CAPSULE | Freq: Two times a day (BID) | ORAL | Status: DC
  Administered 2019-10-09: 100 mg via ORAL
  Filled 2019-10-09 (×2): qty 1

## 2019-10-09 MED ORDER — ARIPIPRAZOLE 10 MG PO TABS
10.0000 mg | ORAL_TABLET | Freq: Every day | ORAL | Status: DC
  Administered 2019-10-09: 10 mg via ORAL
  Filled 2019-10-09: qty 1

## 2019-10-09 MED ORDER — LEVETIRACETAM 500 MG PO TABS
1500.0000 mg | ORAL_TABLET | Freq: Two times a day (BID) | ORAL | Status: DC
  Administered 2019-10-09: 1500 mg via ORAL
  Filled 2019-10-09: qty 3

## 2019-10-09 MED ORDER — LORAZEPAM 2 MG/ML IJ SOLN: 2.0000 mg | INTRAMUSCULAR | Status: DC | PRN

## 2019-10-09 MED ORDER — ACETAMINOPHEN 650 MG RE SUPP: 650.0000 mg | Freq: Four times a day (QID) | RECTAL | Status: DC | PRN

## 2019-10-09 MED ORDER — ONDANSETRON HCL 4 MG PO TABS: 4.0000 mg | ORAL_TABLET | Freq: Four times a day (QID) | ORAL | Status: DC | PRN

## 2019-10-09 MED ORDER — TRAZODONE HCL 50 MG PO TABS
50.0000 mg | ORAL_TABLET | Freq: Every day | ORAL | Status: DC
  Administered 2019-10-09: 50 mg via ORAL
  Filled 2019-10-09: qty 1

## 2019-10-09 MED ORDER — ENOXAPARIN SODIUM 40 MG/0.4ML ~~LOC~~ SOLN
40.0000 mg | SUBCUTANEOUS | Status: DC
  Administered 2019-10-09: 40 mg via SUBCUTANEOUS
  Filled 2019-10-09: qty 0.4

## 2019-10-09 NOTE — H&P (Addendum)
History and Physical    Jeremy Christensen. BJY:[id number] DOB: 03-31-1945 DOA: 10/08/2019  PCP: [facility]   Patient coming from: Home  I have personally briefly reviewed patient's old medical records in [facility] Link  Chief Complaint: Increasing confusion  HPI: Jeremy Christensen. is a 74 y.o. 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 Sherryll Burger was brought to the emergency room after his caregiver noted that he was more confused than his baseline.  They were concerned that patient might have a UTI.  Patient is unable to contribute to the history ED Course: On arrival patient had a low-grade temp of 99.5, BP 105/89 with otherwise normal vitals.  Blood work unremarkable except for troponin of 25 that trended up to 45 with second set.  Urinalysis unremarkable.  CT head and chest x-ray with no acute disease.  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  Hospitalist consulted for admission.  Review of Systems: Unable to obtain due to advanced dementia  Past Medical History:  Diagnosis Date  . Hypertension   . Seizures (HCC)     History reviewed. No pertinent surgical history.   reports that he has been smoking cigars. He has never used smokeless tobacco. He reports that he does not drink alcohol. No history on file for drug use.  Allergies  Allergen Reactions  . Tetracyclines & Related Hives    History reviewed. No pertinent family history.  Patient unable to contribute   Prior to Admission medications   Medication Sig Start Date End Date Taking? Authorizing Provider  acetaminophen (TYLENOL) 325 MG tablet Take 650 mg by mouth every 6 (six) hours as needed for mild pain or fever.    Yes [provider]  aluminum-magnesium hydroxide-simethicone (MAALOX) 200-200-20 MG/5ML SUSP Take 15-30 mLs by mouth at  bedtime as needed (indigestion).    Yes [provider]  Amantadine HCl 100 MG tablet Take 100 mg by mouth 2 (two) times daily.    Yes [provider]  ARIPiprazole (ABILIFY) 10 MG tablet Take 10 mg by mouth at bedtime.    Yes [provider]  donepezil (ARICEPT) 10 MG tablet Take 10 mg by mouth at bedtime.    Yes [provider]  levETIRAcetam (KEPPRA) 500 MG tablet Take 1,500 mg by mouth 2 (two) times daily.  07/08/19  Yes [provider]  loperamide (IMODIUM) 2 MG capsule Take 2 mg by mouth as needed for diarrhea or loose stools.    Yes [provider]  magnesium hydroxide (PHILLIPS MILK OF MAGNESIA) 400 MG/5ML suspension Take 15-30 mLs by mouth at bedtime as needed for mild constipation or moderate constipation.    Yes [provider]  tamsulosin (FLOMAX) 0.4 MG CAPS capsule Take 0.4 mg by mouth at bedtime.    Yes [provider]  traZODone (DESYREL) 50 MG tablet Take 50 mg by mouth at bedtime.    Yes [provider]  Vitamin D, Ergocalciferol, (DRISDOL) 1.25 MG (50000 UNIT) CAPS capsule Take 50,000 Units by mouth every 7 (seven) days.   Yes [provider]    Physical Exam: Vitals:   10/08/19 1658 10/08/19 1659 10/08/19 1935 10/08/19 2322  BP: (!) 105/89  (!) 151/101 (!) 151/101  Pulse: 72  72 59  Resp: 18  20 17   Temp: 99.5 F (37.5 C)  98.7 F (37.1 C)  TempSrc: Oral  Oral   SpO2: 96%  98% 99%  Weight:  (!) 107 kg    Height:  5\' 11"  (1.803 m)       Vitals:   10/08/19 1658 10/08/19 1659 10/08/19 1935 10/08/19 2322  BP: (!) 105/89  (!) 151/101 (!) 151/101  Pulse: 72  72 59  Resp: 18  20 17   Temp: 99.5 F (37.5 C)  98.7 F (37.1 C)   TempSrc: Oral  Oral   SpO2: 96%  98% 99%  Weight:  (!) 107 kg    Height:  5\' 11"  (1.803 m)        Constitutional: Alert.  Oriented to name only.  Restless HEENT:      Head: Normocephalic and atraumatic.         Eyes: PERLA, EOMI, Conjunctivae are  normal. Sclera is non-icteric.       Mouth/Throat: Mucous membranes are moist.       Neck: Supple with no signs of meningismus. Cardiovascular: Regular rate and rhythm. No murmurs, gallops, or rubs. 2+ symmetrical distal pulses are present . No JVD. No LE edema Respiratory: Respiratory effort normal .Lungs sounds clear bilaterally. No wheezes, crackles, or rhonchi.  Gastrointestinal: Soft, non tender, and non distended with positive bowel sounds. No rebound or guarding. Genitourinary: No CVA tenderness. Musculoskeletal: Nontender with normal range of motion in all extremities. No cyanosis, or erythema of extremities. Neurologic: . Face is symmetric. Moving all extremities. No gross focal neurologic deficits . Skin: Skin is warm, dry.  No rash or ulcers Psychiatric: Restless  Labs on Admission: I have personally reviewed following labs and imaging studies  CBC: Recent Labs  Lab 10/08/19 1653  WBC 8.7  HGB 15.0  HCT 43.0  MCV 95.6  PLT 193   Basic Metabolic Panel: Recent Labs  Lab 10/08/19 1653  NA 140  K 4.2  CL 105  CO2 25  GLUCOSE 95  BUN 25*  CREATININE 1.23  CALCIUM 8.9   GFR: Estimated Creatinine Clearance: 67.6 mL/min (by C-G formula based on SCr of 1.23 mg/dL). Liver Function Tests: Recent Labs  Lab 10/08/19 1653  AST 18  ALT 12  ALKPHOS 102  BILITOT 1.6*  PROT 7.1  ALBUMIN 4.3   No results for input(s): LIPASE, AMYLASE in the last 168 hours. No results for input(s): AMMONIA in the last 168 hours. Coagulation Profile: No results for input(s): INR, PROTIME in the last 168 hours. Cardiac Enzymes: No results for input(s): CKTOTAL, CKMB, CKMBINDEX, TROPONINI in the last 168 hours. BNP (last 3 results) No results for input(s): PROBNP in the last 8760 hours. HbA1C: No results for input(s): HGBA1C in the last 72 hours. CBG: No results for input(s): GLUCAP in the last 168 hours. Lipid Profile: No results for input(s): CHOL, HDL, LDLCALC, TRIG, CHOLHDL,  LDLDIRECT in the last 72 hours. Thyroid Function Tests: No results for input(s): TSH, T4TOTAL, FREET4, T3FREE, THYROIDAB in the last 72 hours. Anemia Panel: No results for input(s): VITAMINB12, FOLATE, FERRITIN, TIBC, IRON, RETICCTPCT in the last 72 hours. Urine analysis:    Component Value Date/Time   COLORURINE YELLOW (A) 10/08/2019 2029   APPEARANCEUR CLEAR (A) 10/08/2019 2029   LABSPEC 1.021 10/08/2019 2029   PHURINE 6.0 10/08/2019 2029   GLUCOSEU NEGATIVE 10/08/2019 2029   HGBUR SMALL (A) 10/08/2019 2029   BILIRUBINUR NEGATIVE 10/08/2019 2029   KETONESUR 5 (A) 10/08/2019 2029   PROTEINUR NEGATIVE 10/08/2019 2029   NITRITE NEGATIVE 10/08/2019 2029   LEUKOCYTESUR NEGATIVE 10/08/2019 2029  Radiological Exams on Admission: CT Head Wo Contrast  Result Date: 10/08/2019 CLINICAL DATA:  Altered mental status, confusion EXAM: CT HEAD WITHOUT CONTRAST TECHNIQUE: Contiguous axial images were obtained from the base of the skull through the vertex without intravenous contrast. COMPARISON:  01/18/2017 FINDINGS: Brain: Normal anatomic configuration. Mild parenchymal volume loss is commensurate with the patient's age and is stable since prior examination. Extensive periventricular and subcortical white matter changes are again seen likely reflecting the sequela of small vessel ischemia. No abnormal intra or extra-axial mass lesion or fluid collection. No abnormal mass effect or midline shift. No evidence of acute intracranial hemorrhage or infarct. Ventricular size is normal. Cerebellum unremarkable. Vascular: Unremarkable Skull: Intact Sinuses/Orbits: Paranasal sinuses are clear. Orbits are unremarkable. Other: Mastoid air cells and middle ear cavities are clear. IMPRESSION: No acute intracranial hemorrhage or infarct. Electronically Signed   By: Helyn Numbers MD   On: 10/08/2019 21:40   DG Chest Portable 1 View  Result Date: 10/08/2019 CLINICAL DATA:  Altered mental status. EXAM: PORTABLE CHEST  1 VIEW COMPARISON:  05/16/2017 and 01/19/2017 FINDINGS: Lungs are somewhat hypoinflated without focal airspace consolidation or effusion. Cardiomediastinal silhouette and remainder of the exam is unchanged. IMPRESSION: Hypoinflation without acute cardiopulmonary disease. Electronically Signed   By: Elberta Fortis M.D.   On: 10/08/2019 17:29    EKG: Independently reviewed. 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 Sherryll Burger, presenting with worsening of baseline confusion with finding of uptrending troponin    Acute confusion, in the setting of moderate to severe dementia -No evidence of acute infection.  Possible postictal confusion -Head CT with no acute disease -Fall precautions, neurologic checks, seizure precautions    Elevated troponin -Unable to elicit complaint of chest pain from patient due to advanced dementia and confusion but EKG nonacute -Troponin 25-->45.  -Continue to cycle troponin to peak.  Low suspicion for ACS -Consider cardiology consult    Essential hypertension -Continue antihypertensives  Moderate to severe dementia with behavioral disturbance -Continue home meds  Complex partial seizures -Continue Keppra -ativan prn seizure    DVT prophylaxis: Lovenox  Code Status: full code  Family Communication:  none  Disposition Plan: Back to previous home environment Consults called: none  Status:obs    Andris Baumann MD [facility]     10/09/2019, 1:51 AM

## 2019-10-09 NOTE — Discharge Instructions (Signed)
Dementia Caregiver Guide Dementia is a term used to describe a number of symptoms that affect memory and thinking. The most common symptoms include:  Memory loss.  Trouble with language and communication.  Trouble concentrating.  Poor judgment.  Problems with reasoning.  Child-like behavior and language.  Extreme anxiety.  Angry outbursts.  Wandering from home or public places. Dementia usually gets worse slowly over time. In the early stages, people with dementia can stay independent and safe with some help. In later stages, they need help with daily tasks such as dressing, grooming, and using the bathroom. How to help the person with dementia cope Dementia can be frightening and confusing. Here are some tips to help the person with dementia cope with changes caused by the disease. General tips  Keep the person on track with his or her routine.  Try to identify areas where the person may need help.  Be supportive, patient, calm, and encouraging.  Gently remind the person that adjusting to changes takes time.  Help with the tasks that the person has asked for help with.  Keep the person involved in daily tasks and decisions as much as possible.  Encourage conversation, but try not to get frustrated or harried if the person struggles to find words or does not seem to appreciate your help. Communication tips  When the person is talking or seems frustrated, make eye contact and hold the person's hand.  Ask specific questions that need yes or no answers.  Use simple words, short sentences, and a calm voice. Only give one direction at a time.  When offering choices, limit them to just 1 or 2.  Avoid correcting the person in a negative way.  If the person is struggling to find the right words, gently try to help him or her. How to recognize symptoms of stress Symptoms of stress in caregivers include:  Feeling frustrated or angry with the person with  dementia.  Denying that the person has dementia or that his or her symptoms will not improve.  Feeling hopeless and unappreciated.  Difficulty sleeping.  Difficulty concentrating.  Feeling anxious, irritable, or depressed.  Developing stress-related health problems.  Feeling like you have too little time for your own life. Follow these instructions at home:   Make sure that you and the person you are caring for: ? Get regular sleep. ? Exercise regularly. ? Eat regular, nutritious meals. ? Drink enough fluid to keep your urine clear or pale yellow. ? Take over-the-counter and prescription medicines only as told by your health care providers. ? Attend all scheduled health care appointments.  Join a support group with others who are caregivers.  Ask about respite care resources so that you can have a regular break from the stress of caregiving.  Look for signs of stress in yourself and in the person you are caring for. If you notice signs of stress, take steps to manage it.  Consider any safety risks and take steps to avoid them.  Organize medications in a pill box for each day of the week.  Create a plan to handle any legal or financial matters. Get legal or financial advice if needed.  Keep a calendar in a central location to remind the person of appointments or other activities. Tips for reducing the risk of injury  Keep floors clear of clutter. Remove rugs, magazine racks, and floor lamps.  Keep hallways well lit, especially at night.  Put a handrail and nonslip mat in the bathtub or   shower.  Put childproof locks on cabinets that contain dangerous items, such as medicines, alcohol, guns, toxic cleaning items, sharp tools or utensils, matches, and lighters.  Put the locks in places where the person cannot see or reach them easily. This will help ensure that the person does not wander out of the house and get lost.  Be prepared for emergencies. Keep a list of  emergency phone numbers and addresses in a convenient area.  Remove car keys and lock garage doors so that the person does not try to get in the car and drive.  Have the person wear a bracelet that tracks locations and identifies the person as having memory problems. This should be worn at all times for safety. Where to find support: Many individuals and organizations offer support. These include:  Support groups for people with dementia and for caregivers.  Counselors or therapists.  Home health care services.  Adult day care centers. Where to find more information Alzheimer's Association: www.alz.org Contact a health care provider if:  The person's health is rapidly getting worse.  You are no longer able to care for the person.  Caring for the person is affecting your physical and emotional health.  The person threatens himself or herself, you, or anyone else. Summary  Dementia is a term used to describe a number of symptoms that affect memory and thinking.  Dementia usually gets worse slowly over time.  Take steps to reduce the person's risk of injury, and to plan for future care.  Caregivers need support, relief from caregiving, and time for their own lives. This information is not intended to replace advice given to you by your health care provider. Make sure you discuss any questions you have with your health care provider. Document Revised: 02/13/2017 Document Reviewed: 02/05/2016 Elsevier Patient Education  2020 [employer]  

## 2019-10-09 NOTE — Discharge Summary (Signed)
Physician Discharge Summary  Jeremy Christensen. XLK:[id number] DOB: [birth year] DOA: 10/08/2019  PCP: [facility]  Admit date: 10/08/2019 Discharge date: 10/09/2019  Ad[facility] From: [facility] Discharge disposition: [facility]   Code Status: Full Code  Diet Recommendation: Regular diet as tolerated.   Recommendations for Outpatient Follow-Up:   1. Follow-up with PCP as an outpatient  Discharge Diagnosis:   Active Problems:   Acute confusion   Elevated troponin   Essential hypertension   Seizure disorder (HCC)   Dementia with behavioral disturbance (HCC)  History of Present Illness / Brief narrative:  Jeremy Christensenis a 74 y.o.malewith medical history significant formoderate to advanced dementia and complex partial seizure disorder, followed by neurologist, Dr. Sherryll Christensen. Patient lives in a senior care home and has legal guardians. Patient was brought to the ED on 7/24 after his caregiver noted that he was more confused than his baseline.  Later in the afternoon, while seated on a chair, patient slumped to the floor.  He did not hit his head or passed out. They were concerned with his confusion and fall and hence patient was brought to the ED.   In the ED, patient had a low-grade temperature 99.5. Blood work normal except for troponin slightly elevated to 25 and 45.  No cardiac symptoms. urinalysis unremarkable.   CT head and chest x-ray with no acute disease.  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  Patient was kept on overnight observation.  Hospital Course:  74 year old male with history of moderate to advanced dementia and complex partial seizure followed by neurologist, Dr. Sherryll Christensen, brought to the hospital with worsening of baseline confusion with finding of uptrending troponin  Acute confusion, in the setting of moderate to severe dementia -No evidence of acute infection.  Possible postictal confusion -Head CT with no acute disease -No neurological symptoms on my evaluation this morning.  Patient is at baseline with advanced dementia.  Elevated troponin -Slightly elevated troponin in the absence of cardiac symptoms.  No EKG changes.   -Would not pursue any further ischemia work-up at this time.  Essential hypertension -I do not see any blood pressure medicine in his home list.    He had one episode of blood pressures to 174 last night, otherwise has normal blood pressure.  I confirm from his care provider at [facility] that patient is not on any regular blood pressure medicines.   Moderate to severe dementia with behavioral disturbance -Supportive care.  Continue home meds.  Complex partial seizures -Continue Keppra -ativan prn seizure  BPH -Continue Flomax.  Wound care: Pressure Injury 01/18/17 Stage II -  Partial thickness loss of dermis presenting as a shallow open ulcer with a red, pink wound bed without slough. (Active)  01/18/17 1033  Location: Coccyx  Location Orientation: Medial  Staging: Stage II -  Partial thickness loss of dermis presenting as a shallow open ulcer with a red, pink wound bed without slough.  Wound Description (Comments):   Present on Admission: Yes    Subjective:  Patient was seen and examined this morning. Elderly Caucasian male.  Propped up in bed. Demented at baseline.  Able to answer simple yes/no questions.  Not in physical distress. Chart reviewed.  Afebrile since admission.  Heart rate in 40s and 50s Blood pressure was elevated as high as 172/146 in the night, seems more normal this morning.  I called and discussed with the  given contact person Jeremy Christensen who is his care provider at [facility].  I confirmed the history and updated him about patient's status.  At this time, patient doesn't need any further hospital stay.  All questions answered.  Plan for discharge back today.  Discharge  Exam:   Vitals:   10/09/19 0730 10/09/19 0830 10/09/19 0900 10/09/19 0910  BP: 114/78 123/72 123/68   Pulse: 47 (!) 42 45 57  Resp: 18 13 13 16   Temp:      TempSrc:      SpO2: 100% 96% 97% 99%  Weight:      Height:        Body mass index is 32.9 kg/m.  General exam: Appears calm and comfortable.  Not in physical distress Skin: No rashes, lesions or ulcers. HEENT: Atraumatic, normocephalic, supple neck, no obvious bleeding Lungs: Clear to auscultation bilaterally CVS: Regular rate and rhythm, no murmur GI/Abd soft, nontender, nondistended, bowel sound present CNS: Demented at baseline, able to answer simple questions. Psychiatry: Depressed look Extremities: No edema, no calf tenderness  Discharge Instructions:   Discharge Instructions    Diet general   Complete by: As directed    Increase activity slowly   Complete by: As directed       Follow-up Information    [facility] Follow up.   Contact information: [address] [place] [phone]              Allergies as of 10/09/2019      Reactions   Tetracyclines & Related Hives      Medication List    TAKE these medications   acetaminophen 325 MG tablet Commonly known as: TYLENOL Take 650 mg by mouth every 6 (six) hours as needed for mild pain or fever.   aluminum-magnesium hydroxide-simethicone 200-200-20 MG/5ML Susp Commonly known as: MAALOX Take 15-30 mLs by mouth at bedtime as needed (indigestion).   Amantadine HCl 100 MG tablet Take 100 mg by mouth 2 (two) times daily.   ARIPiprazole 10 MG tablet Commonly known as: ABILIFY Take 10 mg by mouth at bedtime.   donepezil 10 MG tablet Commonly known as: ARICEPT Take 10 mg by mouth at bedtime.   levETIRAcetam 500 MG tablet Commonly known as: KEPPRA Take 1,500 mg by mouth 2 (two) times daily.   loperamide 2 MG capsule Commonly known as: IMODIUM Take 2 mg by mouth as needed for diarrhea or loose stools.     Phillips Milk of Magnesia 400 MG/5ML suspension Generic drug: magnesium hydroxide Take 15-30 mLs by mouth at bedtime as needed for mild constipation or moderate constipation.   tamsulosin 0.4 MG Caps capsule Commonly known as: FLOMAX Take 0.4 mg by mouth at bedtime.   traZODone 50 MG tablet Commonly known as: DESYREL Take 50 mg by mouth at bedtime.   Vitamin D (Ergocalciferol) 1.25 MG (50000 UNIT) Caps capsule Commonly known as: DRISDOL Take 50,000 Units by mouth every 7 (seven) days.       Time coordinating discharge: 35 minutes  The results of significant diagnostics from this hospitalization (including imaging, microbiology, ancillary and laboratory) are listed below for reference.    Procedures and Diagnostic Studies:   CT Head Wo Contrast  Result Date: 10/08/2019 CLINICAL DATA:  Altered mental status, confusion EXAM: CT HEAD WITHOUT CONTRAST TECHNIQUE: Contiguous axial images were obtained from the base of the skull through the vertex without intravenous contrast. COMPARISON:  01/18/2017 FINDINGS: Brain: Normal anatomic configuration. Mild parenchymal  volume loss is commensurate with the patient's age and is stable since prior examination. Extensive periventricular and subcortical white matter changes are again seen likely reflecting the sequela of small vessel ischemia. No abnormal intra or extra-axial mass lesion or fluid collection. No abnormal mass effect or midline shift. No evidence of acute intracranial hemorrhage or infarct. Ventricular size is normal. Cerebellum unremarkable. Vascular: Unremarkable Skull: Intact Sinuses/Orbits: Paranasal sinuses are clear. Orbits are unremarkable. Other: Mastoid air cells and middle ear cavities are clear. IMPRESSION: No acute intracranial hemorrhage or infarct. Electronically Signed   By: Helyn Numbers MD   On: 10/08/2019 21:40   DG Chest Portable 1 View  Result Date: 10/08/2019 CLINICAL DATA:  Altered mental status. EXAM: PORTABLE  CHEST 1 VIEW COMPARISON:  05/16/2017 and 01/19/2017 FINDINGS: Lungs are somewhat hypoinflated without focal airspace consolidation or effusion. Cardiomediastinal silhouette and remainder of the exam is unchanged. IMPRESSION: Hypoinflation without acute cardiopulmonary disease. Electronically Signed   By: Elberta Fortis M.D.   On: 10/08/2019 17:29     Labs:   Basic Metabolic Panel: Recent Labs  Lab 10/08/19 1653  NA 140  K 4.2  CL 105  CO2 25  GLUCOSE 95  BUN 25*  CREATININE 1.23  CALCIUM 8.9   GFR Estimated Creatinine Clearance: 67.6 mL/min (by C-G formula based on SCr of 1.23 mg/dL). Liver Function Tests: Recent Labs  Lab 10/08/19 1653  AST 18  ALT 12  ALKPHOS 102  BILITOT 1.6*  PROT 7.1  ALBUMIN 4.3   No results for input(s): LIPASE, AMYLASE in the last 168 hours. No results for input(s): AMMONIA in the last 168 hours. Coagulation profile No results for input(s): INR, PROTIME in the last 168 hours.  CBC: Recent Labs  Lab 10/08/19 1653  WBC 8.7  HGB 15.0  HCT 43.0  MCV 95.6  PLT 193   Cardiac Enzymes: No results for input(s): CKTOTAL, CKMB, CKMBINDEX, TROPONINI in the last 168 hours. BNP: Invalid input(s): POCBNP CBG: No results for input(s): GLUCAP in the last 168 hours. D-Dimer No results for input(s): DDIMER in the last 72 hours. Hgb A1c No results for input(s): HGBA1C in the last 72 hours. Lipid Profile No results for input(s): CHOL, HDL, LDLCALC, TRIG, CHOLHDL, LDLDIRECT in the last 72 hours. Thyroid function studies No results for input(s): TSH, T4TOTAL, T3FREE, THYROIDAB in the last 72 hours.  Invalid input(s): FREET3 Anemia work up No results for input(s): VITAMINB12, FOLATE, FERRITIN, TIBC, IRON, RETICCTPCT in the last 72 hours. Microbiology Recent Results (from the past 240 hour(s))  SARS Coronavirus 2 by RT PCR (hospital order, performed in [facility] hospital lab) Nasopharyngeal Nasopharyngeal Swab     Status: None   Collection Time:  10/09/19 12:11 AM   Specimen: Nasopharyngeal Swab  Result Value Ref Range Status   SARS Coronavirus 2 NEGATIVE NEGATIVE Final    Comment: (NOTE) SARS-CoV-2 target nucleic acids are NOT DETECTED.  The SARS-CoV-2 RNA is generally detectable in upper and lower respiratory specimens during the acute phase of infection. The lowest concentration of SARS-CoV-2 viral copies this assay can detect is 250 copies / mL. A negative result does not preclude SARS-CoV-2 infection and should not be used as the sole basis for treatment or other patient management decisions.  A negative result may occur with improper specimen collection / handling, submission of specimen other than nasopharyngeal swab, presence of viral mutation(s) within the areas targeted by this assay, and inadequate number of viral copies (<250 copies / mL). A negative result  must be combined with clinical observations, patient history, and epidemiological information.  Fact Sheet for Patients:   BoilerBrush.com.cyhttps://www.fda.gov/media/136312/download  Fact Sheet for Healthcare Providers: https://pope.com/https://www.fda.gov/media/136313/download  This test is not yet approved or  cleared by the [place] FDA and has been authorized for detection and/or diagnosis of SARS-CoV-2 by FDA under an Emergency Use Authorization (EUA).  This EUA will remain in effect (meaning this test can be used) for the duration of the COVID-19 declaration under Section 564(b)(1) of the Act, 21 U.S.C. section 360bbb-3(b)(1), unless the authorization is terminated or revoked sooner.  Performed at [facility], [address] [place], [place] [id number]     Please note: You were cared for by a hospitalist during your hospital stay. Once you are discharged, your primary care physician will handle any further medical issues. Please note that NO REFILLS for any discharge medications will be authorized once you are discharged, as it is imperative that you return to your  primary care physician (or establish a relationship with a primary care physician if you do not have one) for your post hospital discharge needs so that they can reassess your need for medications and monitor your lab values.  Signed: Melina SchoolsBinaya Khale Nigh  [facility] 10/09/2019, 10:48 AM

## 2019-10-09 NOTE — ED Notes (Signed)
Patient group home guardian is at patient bedside.

## 2019-10-16 ENCOUNTER — Other Ambulatory Visit: Payer: Self-pay

## 2019-10-16 ENCOUNTER — Emergency Department: Payer: Medicare Other

## 2019-10-16 ENCOUNTER — Emergency Department
Admission: EM | Admit: 2019-10-16 | Discharge: 2019-10-16 | Disposition: A | Discharge status: Home / Self Care | Payer: Medicare Other | Attending: Emergency Medicine | Admitting: Emergency Medicine

## 2019-10-16 DIAGNOSIS — Y999 Unspecified external cause status: Secondary | ICD-10-CM | POA: Diagnosis not present

## 2019-10-16 DIAGNOSIS — R531 Weakness: Secondary | ICD-10-CM | POA: Insufficient documentation

## 2019-10-16 DIAGNOSIS — R4182 Altered mental status, unspecified: Secondary | ICD-10-CM | POA: Diagnosis not present

## 2019-10-16 DIAGNOSIS — S80212A Abrasion, left knee, initial encounter: Secondary | ICD-10-CM | POA: Diagnosis not present

## 2019-10-16 DIAGNOSIS — W01198A Fall on same level from slipping, tripping and stumbling with subsequent striking against other object, initial encounter: Secondary | ICD-10-CM | POA: Insufficient documentation

## 2019-10-16 DIAGNOSIS — Y939 Activity, unspecified: Secondary | ICD-10-CM | POA: Diagnosis not present

## 2019-10-16 DIAGNOSIS — S0081XA Abrasion of other part of head, initial encounter: Secondary | ICD-10-CM | POA: Insufficient documentation

## 2019-10-16 DIAGNOSIS — Y929 Unspecified place or not applicable: Secondary | ICD-10-CM | POA: Insufficient documentation

## 2019-10-16 DIAGNOSIS — S6992XA Unspecified injury of left wrist, hand and finger(s), initial encounter: Secondary | ICD-10-CM | POA: Diagnosis present

## 2019-10-16 DIAGNOSIS — S62617A Displaced fracture of proximal phalanx of left little finger, initial encounter for closed fracture: Secondary | ICD-10-CM | POA: Diagnosis not present

## 2019-10-16 DIAGNOSIS — S62617B Displaced fracture of proximal phalanx of left little finger, initial encounter for open fracture: Secondary | ICD-10-CM

## 2019-10-16 LAB — URINALYSIS, COMPLETE (UACMP) WITH MICROSCOPIC
Bacteria, UA: NONE SEEN
Bilirubin Urine: NEGATIVE
Glucose, UA: NEGATIVE mg/dL
Hgb urine dipstick: NEGATIVE
Ketones, ur: NEGATIVE mg/dL
Leukocytes,Ua: NEGATIVE
Nitrite: NEGATIVE
Protein, ur: NEGATIVE mg/dL
Specific Gravity, Urine: 1.027 (ref 1.005–1.030)
Squamous Epithelial / HPF: NONE SEEN (ref 0–5)
pH: 6 (ref 5.0–8.0)

## 2019-10-16 LAB — PROTIME-INR
INR: 1.1 (ref 0.8–1.2)
Prothrombin Time: 13.8 seconds (ref 11.4–15.2)

## 2019-10-16 LAB — CBC WITH DIFFERENTIAL/PLATELET
Abs Immature Granulocytes: 0.02 10*3/uL (ref 0.00–0.07)
Basophils Absolute: 0 10*3/uL (ref 0.0–0.1)
Basophils Relative: 1 %
Eosinophils Absolute: 0.2 10*3/uL (ref 0.0–0.5)
Eosinophils Relative: 3 %
HCT: 36.8 % — ABNORMAL LOW (ref 39.0–52.0)
Hemoglobin: 12.7 g/dL — ABNORMAL LOW (ref 13.0–17.0)
Immature Granulocytes: 0 %
Lymphocytes Relative: 16 %
Lymphs Abs: 0.8 10*3/uL (ref 0.7–4.0)
MCH: 33 pg (ref 26.0–34.0)
MCHC: 34.5 g/dL (ref 30.0–36.0)
MCV: 95.6 fL (ref 80.0–100.0)
Monocytes Absolute: 0.3 10*3/uL (ref 0.1–1.0)
Monocytes Relative: 6 %
Neutro Abs: 3.8 10*3/uL (ref 1.7–7.7)
Neutrophils Relative %: 74 %
Platelets: 172 10*3/uL (ref 150–400)
RBC: 3.85 MIL/uL — ABNORMAL LOW (ref 4.22–5.81)
RDW: 13 % (ref 11.5–15.5)
WBC: 5.1 10*3/uL (ref 4.0–10.5)
nRBC: 0 % (ref 0.0–0.2)

## 2019-10-16 LAB — COMPREHENSIVE METABOLIC PANEL
ALT: 19 U/L (ref 0–44)
AST: 19 U/L (ref 15–41)
Albumin: 3.2 g/dL — ABNORMAL LOW (ref 3.5–5.0)
Alkaline Phosphatase: 78 U/L (ref 38–126)
Anion gap: 9 (ref 5–15)
BUN: 26 mg/dL — ABNORMAL HIGH (ref 8–23)
CO2: 24 mmol/L (ref 22–32)
Calcium: 8.2 mg/dL — ABNORMAL LOW (ref 8.9–10.3)
Chloride: 110 mmol/L (ref 98–111)
Creatinine, Ser: 1.13 mg/dL (ref 0.61–1.24)
GFR calc Af Amer: 45 mL/min — ABNORMAL LOW (ref >60)
GFR calc non Af Amer: 39 mL/min — ABNORMAL LOW (ref >60)
Glucose, Bld: 150 mg/dL — ABNORMAL HIGH (ref 70–99)
Potassium: 3.6 mmol/L (ref 3.5–5.1)
Sodium: 143 mmol/L (ref 135–145)
Total Bilirubin: 1.1 mg/dL (ref 0.3–1.2)
Total Protein: 6.2 g/dL — ABNORMAL LOW (ref 6.5–8.1)

## 2019-10-16 LAB — URINE DRUG SCREEN, QUALITATIVE (ARMC ONLY)
Amphetamines, Ur Screen: NOT DETECTED
Barbiturates, Ur Screen: NOT DETECTED
Benzodiazepine, Ur Scrn: NOT DETECTED
Cannabinoid 50 Ng, Ur ~~LOC~~: NOT DETECTED
Cocaine Metabolite,Ur ~~LOC~~: NOT DETECTED
MDMA (Ecstasy)Ur Screen: NOT DETECTED
Methadone Scn, Ur: NOT DETECTED
Opiate, Ur Screen: NOT DETECTED
Phencyclidine (PCP) Ur S: NOT DETECTED
Tricyclic, Ur Screen: NOT DETECTED

## 2019-10-16 LAB — ETHANOL: Alcohol, Ethyl (B): <10 mg/dL (ref <10)

## 2019-10-16 LAB — LACTIC ACID, PLASMA
Lactic Acid, Venous: 2 mmol/L (ref 0.5–1.9)
Lactic Acid, Venous: 1.2 mmol/L (ref 0.5–1.9)

## 2019-10-16 LAB — TROPONIN I (HIGH SENSITIVITY)
Troponin I (High Sensitivity): 12 ng/L (ref <18)
Troponin I (High Sensitivity): 13 ng/L (ref <18)

## 2019-10-16 LAB — CK: Total CK: 62 U/L (ref 49–397)

## 2019-10-16 LAB — GLUCOSE, CAPILLARY: Glucose-Capillary: 141 mg/dL — ABNORMAL HIGH (ref 70–99)

## 2019-10-16 MED ORDER — CEPHALEXIN 500 MG PO CAPS
500.0000 mg | ORAL_CAPSULE | Freq: Three times a day (TID) | ORAL | 0 refills | Status: AC
Start: 2019-10-16 — End: 2019-10-23

## 2019-10-16 MED ORDER — BACITRACIN-NEOMYCIN-POLYMYXIN 400-5-5000 EX OINT
TOPICAL_OINTMENT | Freq: Once | CUTANEOUS | Status: AC
  Administered 2019-10-16: 1 via TOPICAL
  Filled 2019-10-16: qty 1

## 2019-10-16 MED ORDER — LIDOCAINE HCL (PF) 1 % IJ SOLN
5.0000 mL | Freq: Once | INTRAMUSCULAR | Status: DC
  Filled 2019-10-16: qty 5

## 2019-10-16 NOTE — ED Provider Notes (Signed)
[facility] Emergency Department Provider Note  ____________________________________________   First MD Initiated Contact with Patient 10/16/19 1504     (approximate)  I have reviewed the triage vital signs and the nursing notes.   HISTORY  Chief Complaint Altered Mental Status and Fall   HPI Jeremy Christensen is a 74 y.o. male presents to the emergency department for treatment and evaluation after being found walking down the street.  Bystanders called EMS after the patient fell and hit his head on the cement.  Patient is confused to person, place, and time.  He is unable to recall his address.  He is unable to recall his date of birth.  He does not remember where he was going or why he was walking.  He currently only complains of pain in the area of abrasion to his forehead and left hand pain.    No past medical history on file.  There are no problems to display for this patient.  Prior to Admission medications   Medication Sig Start Date End Date Taking? Authorizing Provider  cephALEXin (KEFLEX) 500 MG capsule Take 1 capsule (500 mg total) by mouth 3 (three) times daily for 7 days. 10/16/19 10/23/19  Chinita Pester, FNP    Allergies Patient has no allergy information on record.  No family history on file.  Social History Social History   Tobacco Use  . Smoking status: Not on file  Substance Use Topics  . Alcohol use: Not on file  . Drug use: Not on file    Review of Systems  Level 5 caveat: Patient confused to person, place, and time. ____________________________________________   PHYSICAL EXAM:  VITAL SIGNS: ED Triage Vitals  Enc Vitals Group     BP 10/16/19 1503 (!) 102/54     Pulse Rate 10/16/19 1503 65     Resp 10/16/19 1503 18     Temp 10/16/19 1503 98.5 F (36.9 C)     Temp Source 10/16/19 1503 Oral     SpO2 10/16/19 1503 94 %     Weight 10/16/19 1500 (!) 250 lb (113.4 kg)     Height 10/16/19 1500 5\' 10"  (1.778 m)      Head Circumference --      Peak Flow --      Pain Score 10/16/19 1459 0     Pain Loc --      Pain Edu? --      Excl. in GC? --     Constitutional: Alert and disoriented. Well appearing and in no acute distress. Eyes: Conjunctivae are normal. PERRL. Pupils 13mm, sluggish. EOMI. Head: Abrasion to left forehead. Nose: No congestion/rhinnorhea/epistaxis. Mouth/Throat: Mucous membranes are moist.  Oropharynx non-erythematous. Neck: No stridor. No focal midline tenderness. Cardiovascular: Normal rate, regular rhythm. Grossly normal heart sounds.  Good peripheral circulation. Respiratory: Normal respiratory effort.  No retractions. Lungs CTAB. Gastrointestinal: Soft and nontender. No distention. No abdominal bruits. No CVA tenderness. Musculoskeletal:Left hand swelling over ring and small finger. No lower extremity tenderness nor edema.  No joint effusions. Neurologic:  Normal speech and language. No gross focal neurologic deficits are appreciated. No gait instability. Skin:  Skin is warm and dry. Laceration to skin fold on palmar aspect of left small finger at MCP. Abrasion to left hand and left knee. Psychiatric: Confused, calm, cooperative.  ____________________________________________   LABS (all labs ordered are listed, but only abnormal results are displayed)  Labs Reviewed  COMPREHENSIVE METABOLIC PANEL - Abnormal; Notable for the following  components:      Result Value   Glucose, Bld 150 (*)    BUN 26 (*)    Calcium 8.2 (*)    Total Protein 6.2 (*)    Albumin 3.2 (*)    GFR calc non Af Amer 39 (*)    GFR calc Af Amer 45 (*)    All other components within normal limits  CBC WITH DIFFERENTIAL/PLATELET - Abnormal; Notable for the following components:   RBC 3.85 (*)    Hemoglobin 12.7 (*)    HCT 36.8 (*)    All other components within normal limits  LACTIC ACID, PLASMA - Abnormal; Notable for the following components:   Lactic Acid, Venous 2.0 (*)    All other components  within normal limits  URINALYSIS, COMPLETE (UACMP) WITH MICROSCOPIC - Abnormal; Notable for the following components:   Color, Urine YELLOW (*)    APPearance HAZY (*)    All other components within normal limits  GLUCOSE, CAPILLARY - Abnormal; Notable for the following components:   Glucose-Capillary 141 (*)    All other components within normal limits  LACTIC ACID, PLASMA  PROTIME-INR  CK  ETHANOL  URINE DRUG SCREEN, QUALITATIVE ([facility] ONLY)  CBG MONITORING, ED  TROPONIN I (HIGH SENSITIVITY)  TROPONIN I (HIGH SENSITIVITY)   ____________________________________________  EKG  Rate of 65, sinus rhythm, normal PR interval, normal QRS, no ST elevation or depression. ____________________________________________  RADIOLOGY  ED MD interpretation: CT head and cervical spine negative for any acute findings per radiology.  Chest x-ray negative for acute findings as well.  Fifth digit of the left hand, proximal phalanx angulated, comminuted fracture as well as acute transverse fracture through the proximal metacarpal  Official radiology report(s): DG Chest 2 View  Result Date: 10/16/2019 CLINICAL DATA:  Altered mental status EXAM: CHEST - 2 VIEW COMPARISON:  None. FINDINGS: Mixed interstitial and streaky opacity opacities in the lung bases with some peripheral septal thickening and fissural thickening. Prominence of the cardiac silhouette possibly attributable to low volumes and portable technique. The aorta is calcified. The remaining cardiomediastinal contours are unremarkable. No acute osseous or soft tissue abnormality. Degenerative changes are present in the imaged spine and shoulders. Telemetry leads overlie the chest. IMPRESSION: Mixed interstitial and streaky opacity in the lung bases with some peripheral septal thickening and fissural thickening, could reflect features of edema and atelectasis though atypical infection or aspiration could present in a similar fashion. Electronically  Signed   By: Kreg Shropshire M.D.   On: 10/16/2019 16:04   CT Head Wo Contrast  Result Date: 10/16/2019 CLINICAL DATA:  Altered mental status, fall, left forehead abrasion EXAM: CT HEAD WITHOUT CONTRAST CT CERVICAL SPINE WITHOUT CONTRAST TECHNIQUE: Multidetector CT imaging of the head and cervical spine was performed following the standard protocol without intravenous contrast. Multiplanar CT image reconstructions of the cervical spine were also generated. COMPARISON:  None. FINDINGS: CT HEAD FINDINGS Brain: No evidence of acute infarction, hemorrhage, hydrocephalus, extra-axial collection or mass lesion/mass effect. Symmetric prominence of the ventricles, cisterns and sulci compatible with parenchymal volume loss. Patchy and more confluent areas of white matter hypoattenuation likely reflect chronic microvascular angiopathy, in a patient of advanced age. Vascular: Atherosclerotic calcification of the carotid siphons. No hyperdense vessel. Skull: Left frontal scalp thickening/contusive change no subjacent calvarial fracture. Additional scalp infiltration of the posterior midline, may reflect more chronic scarring. Sinuses/Orbits: Paranasal sinuses are predominantly clear. Few opacified dependent mastoid air cells. Included orbital structures are unremarkable. Other: None. CT CERVICAL  SPINE FINDINGS Alignment: Reversal the normal cervical lordosis. Possibly related to the cervical flexion seen on scout imaging. Stabilization collar absent at the time of examination. No evidence of traumatic listhesis. No abnormally widened, perched or jumped facets. Normal alignment of the craniocervical and atlantoaxial articulations. Skull base and vertebrae: No acute skull base fracture. No vertebral body fracture or height loss. Normal bone mineralization. No worrisome osseous lesions. Moderate arthrosis at the atlantodental interval. Minimal calcific pannus formation posterior to the dens, can be seen with CPPD or rheumatoid  arthropathy. Soft tissues and spinal canal: No pre or paravertebral fluid or swelling. No visible canal hematoma. Disc levels: Multilevel intervertebral disc height loss with spondylitic endplate changes. Larger disc osteophyte complexes are present C5-6, C6-7, C7-T1 resulting in some mild central canal stenosis. Multilevel uncinate spurring and facet hypertrophic changes result in mild-to-moderate multilevel foraminal narrowing most pronounced on the left at C5-6 and right C6-T1. Upper chest: No acute abnormality in the upper chest or imaged lung apices. Other: No concerning thyroid nodules. IMPRESSION: 1. Left frontal scalp thickening/contusive change without subjacent calvarial fracture. Correlate with visual inspection. No acute intracranial abnormality. 2. 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 3. No acute fracture or traumatic listhesis of the cervical spine. 4. Multilevel degenerative disc disease and facet hypertrophic changes of the cervical spine, as above. 5. Minimal calcific pannus formation posterior to the dens, can be seen with CPPD or rheumatoid arthropathy. Electronically Signed   By: Kreg Shropshire M.D.   On: 10/16/2019 16:00   CT Cervical Spine Wo Contrast  Result Date: 10/16/2019 CLINICAL DATA:  Altered mental status, fall, left forehead abrasion EXAM: CT HEAD WITHOUT CONTRAST CT CERVICAL SPINE WITHOUT CONTRAST TECHNIQUE: Multidetector CT imaging of the head and cervical spine was performed following the standard protocol without intravenous contrast. Multiplanar CT image reconstructions of the cervical spine were also generated. COMPARISON:  None. FINDINGS: CT HEAD FINDINGS Brain: No evidence of acute infarction, hemorrhage, hydrocephalus, extra-axial collection or mass lesion/mass effect. Symmetric prominence of the ventricles, cisterns and sulci compatible with parenchymal volume loss.  Patchy and more confluent areas of white matter hypoattenuation likely reflect chronic microvascular angiopathy, in a patient of advanced age. Vascular: Atherosclerotic calcification of the carotid siphons. No hyperdense vessel. Skull: Left frontal scalp thickening/contusive change no subjacent calvarial fracture. Additional scalp infiltration of the posterior midline, may reflect more chronic scarring. Sinuses/Orbits: Paranasal sinuses are predominantly clear. Few opacified dependent mastoid air cells. Included orbital structures are unremarkable. Other: None. CT CERVICAL SPINE FINDINGS Alignment: Reversal the normal cervical lordosis. Possibly related to the cervical flexion seen on scout imaging. Stabilization collar absent at the time of examination. No evidence of traumatic listhesis. No abnormally widened, perched or jumped facets. Normal alignment of the craniocervical and atlantoaxial articulations. Skull base and vertebrae: No acute skull base fracture. No vertebral body fracture or height loss. Normal bone mineralization. No worrisome osseous lesions. Moderate arthrosis at the atlantodental interval. Minimal calcific pannus formation posterior to the dens, can be seen with CPPD or rheumatoid arthropathy. Soft tissues and spinal canal: No pre or paravertebral fluid or swelling. No visible canal hematoma. Disc levels: Multilevel intervertebral disc height loss with spondylitic endplate changes. Larger disc osteophyte complexes are present C5-6, C6-7, C7-T1 resulting in some mild central canal stenosis. Multilevel uncinate spurring and facet hypertrophic changes result in mild-to-moderate multilevel foraminal narrowing most pronounced on the left at C5-6 and right  C6-T1. Upper chest: No acute abnormality in the upper chest or imaged lung apices. Other: No concerning thyroid nodules. IMPRESSION: 1. Left frontal scalp thickening/contusive change without subjacent calvarial fracture. Correlate with visual  inspection. No acute intracranial abnormality. 2. 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 3. No acute fracture or traumatic listhesis of the cervical spine. 4. Multilevel degenerative disc disease and facet hypertrophic changes of the cervical spine, as above. 5. Minimal calcific pannus formation posterior to the dens, can be seen with CPPD or rheumatoid arthropathy. Electronically Signed   By: Kreg Shropshire M.D.   On: 10/16/2019 16:00   DG Hand Complete Left  Result Date: 10/16/2019 CLINICAL DATA:  Left hand pain after fall in the straight. EXAM: LEFT HAND - COMPLETE 3+ VIEW COMPARISON:  None. FINDINGS: Acute fracture of the fifth digit proximal phalanx with mild apex radial angulation and combination. No definite intra-articular extension. Acute transverse fracture through the distal fifth metacarpal without definite intra-articular extension. The remainder of the hand appears intact. Soft tissue edema noted at the fracture sites. IMPRESSION: 1. Acute angulated and comminuted fifth digit proximal phalanx fracture. 2. Acute transverse fracture through the distal fifth metacarpal. Electronically Signed   By: Narda Rutherford M.D.   On: 10/16/2019 18:26    ____________________________________________   PROCEDURES  Procedure(s) performed (including Critical Care):  Procedures   ____________________________________________   INITIAL IMPRESSION / ASSESSMENT AND PLAN / ED COURSE  As part of my medical decision making, I reviewed the following data within the electronic MEDICAL RECORD NUMBER         74 year old male presenting to the emergency department after being found ambulating down the street and falling.  See HPI for further details.  He is most likely from a nursing facility or lives at home.  He is clean and appears well cared for.  Plan will be to do an altered mental status work-up since  he is completely confused.  Clinical Course as of Oct 15 2000  Wynelle Link Oct 16, 2019  1510 Lactic 2.0. Not likely related to sepsis   [CT]  1819 Group home care taker arrived to identify patient. He states that the patient had been weak and unwilling/unable to walk very far over the past week or so and had required wheel chair. He states that today he must have gotten enough energy to get up and walk away from the facility. He states that the patient is at his baseline cognitively.    [CT]  1900 Acute angulated fracture of 5th metacarpal   [CT]  1958 Repeat lactic acid is normal.  Wound to the left hand was repaired as detailed above.  Patient tolerated the procedure well.  Neosporin dressing ordered as well as an aluminum foam splint to support the fracture.  Patient will be discharged back to his care facility follow-up with his primary care provider as well as orthopedics.  Because this is most likely an open fracture, he will be started on Keflex.  Caregivers will be advised to return with him to the emergency department for any symptoms of concern if they are unable to see his primary care provider or the specialist.   [CT]    Clinical Course User Index [CT] Harpreet Signore B, FNP     ____________________________________________   FINAL CLINICAL IMPRESSION(S) / ED DIAGNOSES  Final diagnoses:  Open displaced fracture of proximal phalanx of left little finger, initial  encounter  Abrasion of forehead, initial encounter  Knee abrasion, left, initial encounter     ED Discharge Orders         Ordered    cephALEXin (KEFLEX) 500 MG capsule  3 times daily     Discontinue  Reprint     10/16/19 1953           Note:  This document was prepared using Dragon voice recognition software and may include unintentional dictation errors.Chinita Pester\   Dewanda Fennema B, FNP 10/16/19 Ed [id number]    Kinner, Robert, MD 10/16/19 2059

## 2019-10-16 NOTE — ED Notes (Addendum)
Pt states name is Jeremy Christensen

## 2019-10-16 NOTE — Discharge Instructions (Signed)
Do not get the sutured area wet for 24 hours. After 24 hours, shower/bathe as usual and pat the area dry. °Change the bandage 2 times per day and apply antibiotic ointment. °Leave open to air when at no risk of getting the area dirty, but cover at night before bed. °See your PCP or go to Urgent Care in 10 days for suture removal or sooner for signs or concern of infection. ° °

## 2019-10-16 NOTE — ED Notes (Signed)
Cari Beth notified of lactic 2.0

## 2019-10-16 NOTE — ED Notes (Signed)
Pt left pinky wrapped and splinted at this time

## 2019-10-16 NOTE — ED Triage Notes (Signed)
Pt arrives via EMS after falling in the street- pt cannot remember his name or his birthday- pt is confused x4- a bystander called EMS after pt fell- pt has an abrasion on the forehead and left knee- pt has a lac to the left hand that is bandaged by EMS

## 2019-10-16 NOTE — ED Notes (Signed)
Pt taken for CT 

## 2020-01-10 ENCOUNTER — Other Ambulatory Visit: Payer: Self-pay

## 2020-01-10 ENCOUNTER — Emergency Department: Payer: Medicare Other

## 2020-01-10 ENCOUNTER — Emergency Department
Admission: EM | Admit: 2020-01-10 | Discharge: 2020-01-10 | Disposition: A | Discharge status: Home / Self Care | Payer: Medicare Other | Attending: Emergency Medicine | Admitting: Emergency Medicine

## 2020-01-10 DIAGNOSIS — F17290 Nicotine dependence, other tobacco product, uncomplicated: Secondary | ICD-10-CM | POA: Diagnosis not present

## 2020-01-10 DIAGNOSIS — I10 Essential (primary) hypertension: Secondary | ICD-10-CM | POA: Insufficient documentation

## 2020-01-10 DIAGNOSIS — W19XXXA Unspecified fall, initial encounter: Secondary | ICD-10-CM | POA: Diagnosis not present

## 2020-01-10 DIAGNOSIS — S0081XA Abrasion of other part of head, initial encounter: Secondary | ICD-10-CM | POA: Insufficient documentation

## 2020-01-10 DIAGNOSIS — F0390 Unspecified dementia without behavioral disturbance: Secondary | ICD-10-CM

## 2020-01-10 DIAGNOSIS — R4182 Altered mental status, unspecified: Secondary | ICD-10-CM | POA: Diagnosis not present

## 2020-01-10 DIAGNOSIS — S0990XA Unspecified injury of head, initial encounter: Secondary | ICD-10-CM | POA: Diagnosis present

## 2020-01-10 DIAGNOSIS — Y92410 Unspecified street and highway as the place of occurrence of the external cause: Secondary | ICD-10-CM | POA: Diagnosis not present

## 2020-01-10 LAB — URINALYSIS, COMPLETE (UACMP) WITH MICROSCOPIC
Bacteria, UA: NONE SEEN
Bilirubin Urine: NEGATIVE
Glucose, UA: NEGATIVE mg/dL
Hgb urine dipstick: NEGATIVE
Ketones, ur: NEGATIVE mg/dL
Leukocytes,Ua: NEGATIVE
Nitrite: NEGATIVE
Protein, ur: NEGATIVE mg/dL
Specific Gravity, Urine: 1.014 (ref 1.005–1.030)
Squamous Epithelial / HPF: NONE SEEN (ref 0–5)
pH: 6 (ref 5.0–8.0)

## 2020-01-10 LAB — CBC WITH DIFFERENTIAL/PLATELET
Abs Immature Granulocytes: 0.01 10*3/uL (ref 0.00–0.07)
Basophils Absolute: 0 10*3/uL (ref 0.0–0.1)
Basophils Relative: 1 %
Eosinophils Absolute: 0.1 10*3/uL (ref 0.0–0.5)
Eosinophils Relative: 2 %
HCT: 43.5 % (ref 39.0–52.0)
Hemoglobin: 14.4 g/dL (ref 13.0–17.0)
Immature Granulocytes: 0 %
Lymphocytes Relative: 19 %
Lymphs Abs: 1.1 10*3/uL (ref 0.7–4.0)
MCH: 31.1 pg (ref 26.0–34.0)
MCHC: 33.1 g/dL (ref 30.0–36.0)
MCV: 94 fL (ref 80.0–100.0)
Monocytes Absolute: 0.4 10*3/uL (ref 0.1–1.0)
Monocytes Relative: 8 %
Neutro Abs: 3.9 10*3/uL (ref 1.7–7.7)
Neutrophils Relative %: 70 %
Platelets: 178 10*3/uL (ref 150–400)
RBC: 4.63 MIL/uL (ref 4.22–5.81)
RDW: 12.8 % (ref 11.5–15.5)
WBC: 5.5 10*3/uL (ref 4.0–10.5)
nRBC: 0 % (ref 0.0–0.2)

## 2020-01-10 LAB — COMPREHENSIVE METABOLIC PANEL
ALT: 13 U/L (ref 0–44)
AST: 18 U/L (ref 15–41)
Albumin: 3.8 g/dL (ref 3.5–5.0)
Alkaline Phosphatase: 117 U/L (ref 38–126)
Anion gap: 10 (ref 5–15)
BUN: 19 mg/dL (ref 8–23)
CO2: 25 mmol/L (ref 22–32)
Calcium: 8.4 mg/dL — ABNORMAL LOW (ref 8.9–10.3)
Chloride: 103 mmol/L (ref 98–111)
Creatinine, Ser: 1.1 mg/dL (ref 0.61–1.24)
GFR, Estimated: >60 mL/min (ref >60)
Glucose, Bld: 96 mg/dL (ref 70–99)
Potassium: 3.8 mmol/L (ref 3.5–5.1)
Sodium: 138 mmol/L (ref 135–145)
Total Bilirubin: 0.9 mg/dL (ref 0.3–1.2)
Total Protein: 6.9 g/dL (ref 6.5–8.1)

## 2020-01-10 MED ORDER — LEVETIRACETAM 500 MG PO TABS
1500.0000 mg | ORAL_TABLET | Freq: Once | ORAL | Status: AC
  Administered 2020-01-10: 1500 mg via ORAL
  Filled 2020-01-10: qty 3

## 2020-01-10 MED ORDER — ARIPIPRAZOLE 10 MG PO TABS
10.0000 mg | ORAL_TABLET | Freq: Once | ORAL | Status: AC
  Administered 2020-01-10: 10 mg via ORAL
  Filled 2020-01-10: qty 1

## 2020-01-10 MED ORDER — AMANTADINE HCL 100 MG PO CAPS
100.0000 mg | ORAL_CAPSULE | Freq: Once | ORAL | Status: AC
  Administered 2020-01-10: 100 mg via ORAL
  Filled 2020-01-10: qty 1

## 2020-01-10 MED ORDER — DONEPEZIL HCL 5 MG PO TABS
10.0000 mg | ORAL_TABLET | Freq: Once | ORAL | Status: AC
  Administered 2020-01-10: 10 mg via ORAL
  Filled 2020-01-10: qty 2

## 2020-01-10 NOTE — Discharge Instructions (Signed)
Mr. Jeremy Christensen was given his night-time AMANTADINE, ARIPIPRAZOLE, DONEPEZIL, AND KEPPRA  PLEASE KEEP AN EYE ON Jeremy Christensen AND CONSIDER ESCALATING HIS LEVEL OF CARE IF HE CONTINUES TO WANDER OFF

## 2020-01-10 NOTE — ED Notes (Signed)
PT able to stand and pivot into wheelchair independently. NAD at this time.

## 2020-01-10 NOTE — ED Notes (Signed)
This RN assisted Carollee Herter, RN with In and out cath. Pericare provided and foreskin returned to normal position after done. PT tolerated well.

## 2020-01-10 NOTE — ED Notes (Signed)
Pt unable to provide urine specimen at this time

## 2020-01-10 NOTE — ED Notes (Signed)
Patient transported to CT 

## 2020-01-10 NOTE — ED Triage Notes (Signed)
Pt here via [facility] from roadside after a presumed fall. Silver alert matching patient's appearance and name confirms pt is missing from a gorup home, hx of the same. Pt alert to self only, unknown if this is baseline.   EMS VSS-cbg 101, temp 98 oral.

## 2020-01-10 NOTE — ED Notes (Signed)
Emergency contact updated and confirmed he will be to ED to pick up patient in approx. 40 minutes.

## 2020-01-10 NOTE — ED Provider Notes (Signed)
[facility] Emergency Department Provider Note  ____________________________________________   First MD Initiated Contact with Patient 01/10/20 1728     (approximate)  I have reviewed the triage vital signs and the nursing notes.   HISTORY  Chief Complaint Altered Mental Status and Fall    HPI Jeremy Christensen. is a 74 y.o. male  With h/o dementia, seizure disorder, HTN, here with reported fall, confusion. Pt was found walking outside by PD. Per report, he had a small amount of dirt on his head concerning for fall. Pt has dementia and is oriented to person only at baseline per report, so history limited. Per EMS, [place] was in place at a local group home and they have confirmed that pt is a resident there, and that he has walked away/roamed in the past. On my assessment, pt denies any complaints though oriented to person only. He does not recall what resulted in him arriving tot he ED.  Level 5 caveat invoked as remainder of history, ROS, and physical exam limited due to patient's dementia.         Past Medical History:  Diagnosis Date  . Hypertension   . Seizures [facility])     Patient Active Problem List   Diagnosis Date Noted  . Seizure disorder (HCC) 10/09/2019  . Dementia with behavioral disturbance (HCC) 10/09/2019  . Elevated troponin 10/08/2019  . Essential hypertension 10/08/2019  . Acute confusion 01/20/2017  . Altered mental status 01/18/2017  . Pressure injury of skin 01/18/2017    History reviewed. No pertinent surgical history.  Prior to Admission medications   Medication Sig Start Date End Date Taking? Authorizing Provider  acetaminophen (TYLENOL) 325 MG tablet Take 650 mg by mouth every 6 (six) hours as needed for mild pain or fever.     [provider]  aluminum-magnesium hydroxide-simethicone (MAALOX) 200-200-20 MG/5ML SUSP Take 15-30 mLs by mouth at bedtime as needed (indigestion).     [provider]  Amantadine HCl 100 MG tablet Take 100 mg by mouth 2 (two) times daily.     [provider]  ARIPiprazole (ABILIFY) 10 MG tablet Take 10 mg by mouth at bedtime.     [provider]  donepezil (ARICEPT) 10 MG tablet Take 10 mg by mouth at bedtime.     [provider]  levETIRAcetam (KEPPRA) 500 MG tablet Take 1,500 mg by mouth 2 (two) times daily.  07/08/19   [provider]  loperamide (IMODIUM) 2 MG capsule Take 2 mg by mouth as needed for diarrhea or loose stools.     [provider]  magnesium hydroxide (PHILLIPS MILK OF MAGNESIA) 400 MG/5ML suspension Take 15-30 mLs by mouth at bedtime as needed for mild constipation or moderate constipation.     [provider]  tamsulosin (FLOMAX) 0.4 MG CAPS capsule Take 0.4 mg by mouth at bedtime.     [provider]  traZODone (DESYREL) 50 MG tablet Take 50 mg by mouth at bedtime.     [provider]  Vitamin D, Ergocalciferol, (DRISDOL) 1.25 MG (50000 UNIT) CAPS capsule Take 50,000 Units by mouth every 7 (seven) days.    [provider]    Allergies Tetracyclines & related  No family history on file.  Social History Social History   Tobacco Use  . Smoking status: Current Every Day Smoker    Types: Cigars  . Smokeless tobacco: Never Used  Substance Use Topics  . Alcohol use: No  .  Drug use: Not on file    Review of Systems  Review of Systems  Unable to perform ROS: Dementia     ____________________________________________  PHYSICAL EXAM:      VITAL SIGNS: ED Triage Vitals  Enc Vitals Group     BP      Pulse      Resp      Temp      Temp src      SpO2      Weight      Height      Head Circumference      Peak Flow      Pain Score      Pain Loc      Pain Edu?      Excl. in GC?      Physical Exam Vitals and nursing note reviewed.  Constitutional:      General: He is not in acute distress.    Appearance: He is well-developed.  HENT:      Head: Normocephalic and atraumatic.     Comments: Superficial abrasion to anterior forehead with surrounding dirt, mild TTP. No hematoma. No periorbital or postauricular ecchymoses. Eyes:     Conjunctiva/sclera: Conjunctivae normal.  Cardiovascular:     Rate and Rhythm: Normal rate and regular rhythm.     Heart sounds: Normal heart sounds.  Pulmonary:     Effort: Pulmonary effort is normal. No respiratory distress.     Breath sounds: No wheezing.  Abdominal:     General: There is no distension.  Musculoskeletal:     Cervical back: Neck supple.  Skin:    General: Skin is warm.     Capillary Refill: Capillary refill takes less than 2 seconds.     Findings: No rash.  Neurological:     Mental Status: He is alert. Mental status is at baseline. He is disoriented.     Motor: No abnormal muscle tone.     Comments: MAE with 5/5 strength bilaterally. Disoriented. No apparent CN deficits.       ____________________________________________   LABS (all labs ordered are listed, but only abnormal results are displayed)  Labs Reviewed  COMPREHENSIVE METABOLIC PANEL - Abnormal; Notable for the following components:      Result Value   Calcium 8.4 (*)    All other components within normal limits  URINALYSIS, COMPLETE (UACMP) WITH MICROSCOPIC - Abnormal; Notable for the following components:   Color, Urine YELLOW (*)    APPearance CLEAR (*)    All other components within normal limits  CBC WITH DIFFERENTIAL/PLATELET    ____________________________________________  EKG: Normal sinus rhythm, VR 55, PR 262, QRS 100, QTc 440. LVH, LAD. TWA, non specific with no ST elevations or depressions. No ischemia or infarct.  ________________________________________  RADIOLOGY All imaging, including plain films, CT scans, and ultrasounds, independently reviewed by me, and interpretations confirmed via formal radiology reads.  ED MD interpretation:   CT Head/C-Spine: NAICA, ethmoid sinus mucosal  thickening, no fx CXR: c  Official radiology report(s): CT Head Wo Contrast  Result Date: 01/10/2020 CLINICAL DATA:  Delirium.  Neck trauma.  Presumed fall. EXAM: CT HEAD WITHOUT CONTRAST CT CERVICAL SPINE WITHOUT CONTRAST TECHNIQUE: Multidetector CT imaging of the head and cervical spine was performed following the standard protocol without intravenous contrast. Multiplanar CT image reconstructions of the cervical spine were also generated. COMPARISON:  CT head/cervical spine 10/16/2019. FINDINGS: CT HEAD FINDINGS Brain: Moderate generalized cerebral atrophy. Advanced ill-defined hypoattenuation within the cerebral white matter is  nonspecific, but compatible with chronic small vessel ischemic disease. A small chronic cortically based left occipital lobe infarct is questioned, unchanged from the prior head CT of 10/16/2019 (series 2, image 15). There is no acute intracranial hemorrhage. No acute demarcated cortical infarct is identified. No extra-axial fluid collection. No evidence of intracranial mass. No midline shift. Vascular: No hyperdense vessel. Skull: Normal. Negative for fracture or focal lesion. Sinuses/Orbits: Visualized orbits show no acute finding. Mild ethmoid sinus mucosal thickening at the imaged levels. No significant mastoid effusion. CT CERVICAL SPINE FINDINGS Alignment: Straightening of the expected cervical lordosis. No significant spondylolisthesis Skull base and vertebrae: The basion-dental and atlanto-dental intervals are maintained.No evidence of acute fracture to the cervical spine. Soft tissues and spinal canal: No prevertebral fluid or swelling. No visible canal hematoma. Disc levels: Cervical spondylosis with multilevel disc space narrowing, disc bulges, posterior disc osteophytes, uncovertebral hypertrophy and facet arthrosis. Disc space narrowing is moderate/severe at C5-C6, C6-C7 and C7-T1. Upper chest: No consolidation within the imaged lung apices. No visible pneumothorax.  IMPRESSION: CT head: 1. No evidence of acute intracranial abnormality. 2. Moderate cerebral atrophy with advanced chronic small vessel ischemic disease. 3. 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 4. Mild ethmoid sinus mucosal thickening. CT cervical spine: 1. No evidence of acute fracture to the cervical spine. 2. Cervical spondylosis as outlined. Electronically Signed   By: Jackey LogeKyle  Golden DO   On: 01/10/2020 17:56   CT Cervical Spine Wo Contrast  Result Date: 01/10/2020 CLINICAL DATA:  Delirium.  Neck trauma.  Presumed fall. EXAM: CT HEAD WITHOUT CONTRAST CT CERVICAL SPINE WITHOUT CONTRAST TECHNIQUE: Multidetector CT imaging of the head and cervical spine was performed following the standard protocol without intravenous contrast. Multiplanar CT image reconstructions of the cervical spine were also generated. COMPARISON:  CT head/cervical spine 10/16/2019. FINDINGS: CT HEAD FINDINGS Brain: Moderate generalized cerebral atrophy. Advanced ill-defined hypoattenuation within the cerebral white matter is nonspecific, but compatible with chronic small vessel ischemic disease. A small chronic cortically based left occipital lobe infarct is questioned, unchanged from the prior head CT of 10/16/2019 (series 2, image 15). There is no acute intracranial hemorrhage. No acute demarcated cortical infarct is identified. No extra-axial fluid collection. No evidence of intracranial mass. No midline shift. Vascular: No hyperdense vessel. Skull: Normal. Negative for fracture or focal lesion. Sinuses/Orbits: Visualized orbits show no acute finding. Mild ethmoid sinus mucosal thickening at the imaged levels. No significant mastoid effusion. CT CERVICAL SPINE FINDINGS Alignment: Straightening of the expected cervical lordosis. No significant spondylolisthesis Skull base and vertebrae: The basion-dental and atlanto-dental intervals are maintained.No evidence of acute fracture to the  cervical spine. Soft tissues and spinal canal: No prevertebral fluid or swelling. No visible canal hematoma. Disc levels: Cervical spondylosis with multilevel disc space narrowing, disc bulges, posterior disc osteophytes, uncovertebral hypertrophy and facet arthrosis. Disc space narrowing is moderate/severe at C5-C6, C6-C7 and C7-T1. Upper chest: No consolidation within the imaged lung apices. No visible pneumothorax. IMPRESSION: CT head: 1. No evidence of acute intracranial abnormality. 2. Moderate cerebral atrophy with advanced chronic small vessel ischemic disease. 3. 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 4. Mild ethmoid sinus mucosal thickening. CT cervical spine: 1. No evidence of acute fracture to the cervical spine. 2. Cervical spondylosis as outlined. Electronically Signed   By: Jackey LogeKyle  Golden DO   On: 01/10/2020 17:56   DG Chest Portable 1 View  Result Date: 01/10/2020 CLINICAL DATA:  Weakness,  confusion EXAM: PORTABLE CHEST 1 VIEW COMPARISON:  Radiograph 10/16/2019 FINDINGS: Shallow inspiration/low lung volumes. Some coarsened interstitial changes may be accentuated by this level volume exam and superimposed streaky atelectasis. No new consolidative opacity. No pneumothorax or visible effusion. Cardiomegaly and cardiomediastinal contours are similar to prior accounting for differences in technique. No acute osseous or soft tissue abnormality. Degenerative changes are present in the imaged spine and shoulders. IMPRESSION: 1. Chronically coarsened interstitial changes may be accentuated by this level volume exam and superimposed streaky atelectasis. 2. No new consolidative opacity. Electronically Signed   By: Kreg Shropshire M.D.   On: 01/10/2020 18:08    ____________________________________________  PROCEDURES   Procedure(s) performed (including Critical Care):  Procedures  ____________________________________________  INITIAL IMPRESSION / MDM /  ASSESSMENT AND PLAN / ED COURSE  As part of my medical decision making, I reviewed the following data within the electronic MEDICAL RECORD NUMBER Nursing notes reviewed and incorporated, Old chart reviewed, Notes from prior ED visits, and [place] Controlled Substance Database       *Amanuel Sinkfield. was evaluated in Emergency Department on 01/10/2020 for the symptoms described in the history of present illness. He was evaluated in the context of the global COVID-19 pandemic, which necessitated consideration that the patient might be at risk for infection with the SARS-CoV-2 virus that causes COVID-19. Institutional protocols and algorithms that pertain to the evaluation of patients at risk for COVID-19 are in a state of rapid change based on information released by regulatory bodies including the CDC and federal and state organizations. These policies and algorithms were followed during the patient's care in the ED.  Some ED evaluations and interventions may be delayed as a result of limited staffing during the pandemic.*     Medical Decision Making:  74 yo M here after being found wandering. H/o similar behavior 2/2 his dementia. On arrival, VSS and pt is in NAD. Possible head trauma noted so CT obtained, which is unremarkable. Screening labs reassuring - no leukocytosis, anemia, lyte abnormality. UA without UTI. CXR shows chronic interstitial pattern w/o focal PNA. CXR and CT reviewed by me, CT shows NAICA, no fx. Will d/c back to facility with instructions to monitor for wandering. He has been given his night-time medications here and facility/group home updated.  ____________________________________________  FINAL CLINICAL IMPRESSION(S) / ED DIAGNOSES  Final diagnoses:  Dementia without behavioral disturbance, unspecified dementia type (HCC)     MEDICATIONS GIVEN DURING THIS VISIT:  Medications  levETIRAcetam (KEPPRA) tablet 1,500 mg (has no administration in time range)  amantadine  (SYMMETREL) capsule 100 mg (has no administration in time range)  donepezil (ARICEPT) tablet 10 mg (has no administration in time range)  ARIPiprazole (ABILIFY) tablet 10 mg (has no administration in time range)     ED Discharge Orders    None       Note:  This document was prepared using Dragon voice recognition software and may include unintentional dictation errors.   Shaune Pollack, MD 01/10/20 2008

## 2020-03-29 ENCOUNTER — Emergency Department
Admission: EM | Admit: 2020-03-29 | Discharge: 2020-03-29 | Disposition: A | Discharge status: Home / Self Care | Payer: Medicare Other | Attending: Emergency Medicine | Admitting: Emergency Medicine

## 2020-03-29 ENCOUNTER — Other Ambulatory Visit: Payer: Self-pay

## 2020-03-29 DIAGNOSIS — Z79899 Other long term (current) drug therapy: Secondary | ICD-10-CM | POA: Diagnosis not present

## 2020-03-29 DIAGNOSIS — T68XXXA Hypothermia, initial encounter: Secondary | ICD-10-CM | POA: Insufficient documentation

## 2020-03-29 DIAGNOSIS — Y9301 Activity, walking, marching and hiking: Secondary | ICD-10-CM | POA: Diagnosis not present

## 2020-03-29 DIAGNOSIS — F17290 Nicotine dependence, other tobacco product, uncomplicated: Secondary | ICD-10-CM | POA: Diagnosis not present

## 2020-03-29 DIAGNOSIS — I10 Essential (primary) hypertension: Secondary | ICD-10-CM | POA: Insufficient documentation

## 2020-03-29 DIAGNOSIS — F0390 Unspecified dementia without behavioral disturbance: Secondary | ICD-10-CM | POA: Insufficient documentation

## 2020-03-29 DIAGNOSIS — Z23 Encounter for immunization: Secondary | ICD-10-CM | POA: Insufficient documentation

## 2020-03-29 DIAGNOSIS — S90811A Abrasion, right foot, initial encounter: Secondary | ICD-10-CM | POA: Insufficient documentation

## 2020-03-29 DIAGNOSIS — Y9289 Other specified places as the place of occurrence of the external cause: Secondary | ICD-10-CM | POA: Diagnosis not present

## 2020-03-29 DIAGNOSIS — T07XXXA Unspecified multiple injuries, initial encounter: Secondary | ICD-10-CM

## 2020-03-29 DIAGNOSIS — S99921A Unspecified injury of right foot, initial encounter: Secondary | ICD-10-CM | POA: Diagnosis present

## 2020-03-29 DIAGNOSIS — X58XXXA Exposure to other specified factors, initial encounter: Secondary | ICD-10-CM | POA: Diagnosis not present

## 2020-03-29 DIAGNOSIS — X31XXXA Exposure to excessive natural cold, initial encounter: Secondary | ICD-10-CM | POA: Insufficient documentation

## 2020-03-29 DIAGNOSIS — S90812A Abrasion, left foot, initial encounter: Secondary | ICD-10-CM | POA: Diagnosis not present

## 2020-03-29 LAB — BASIC METABOLIC PANEL
Anion gap: 12 (ref 5–15)
BUN: 20 mg/dL (ref 8–23)
CO2: 25 mmol/L (ref 22–32)
Calcium: 9.3 mg/dL (ref 8.9–10.3)
Chloride: 102 mmol/L (ref 98–111)
Creatinine, Ser: 1.34 mg/dL — ABNORMAL HIGH (ref 0.61–1.24)
GFR, Estimated: 56 mL/min — ABNORMAL LOW (ref >60)
Glucose, Bld: 91 mg/dL (ref 70–99)
Potassium: 3.5 mmol/L (ref 3.5–5.1)
Sodium: 139 mmol/L (ref 135–145)

## 2020-03-29 LAB — CBC WITH DIFFERENTIAL/PLATELET
Abs Immature Granulocytes: 0.02 10*3/uL (ref 0.00–0.07)
Basophils Absolute: 0 10*3/uL (ref 0.0–0.1)
Basophils Relative: 1 %
Eosinophils Absolute: 0 10*3/uL (ref 0.0–0.5)
Eosinophils Relative: 0 %
HCT: 46 % (ref 39.0–52.0)
Hemoglobin: 15.5 g/dL (ref 13.0–17.0)
Immature Granulocytes: 0 %
Lymphocytes Relative: 8 %
Lymphs Abs: 0.5 10*3/uL — ABNORMAL LOW (ref 0.7–4.0)
MCH: 32.6 pg (ref 26.0–34.0)
MCHC: 33.7 g/dL (ref 30.0–36.0)
MCV: 96.6 fL (ref 80.0–100.0)
Monocytes Absolute: 0.3 10*3/uL (ref 0.1–1.0)
Monocytes Relative: 5 %
Neutro Abs: 5.8 10*3/uL (ref 1.7–7.7)
Neutrophils Relative %: 86 %
Platelets: 162 10*3/uL (ref 150–400)
RBC: 4.76 MIL/uL (ref 4.22–5.81)
RDW: 12.9 % (ref 11.5–15.5)
WBC: 6.7 10*3/uL (ref 4.0–10.5)
nRBC: 0 % (ref 0.0–0.2)

## 2020-03-29 LAB — CBG MONITORING, ED: Glucose-Capillary: 91 mg/dL (ref 70–99)

## 2020-03-29 LAB — CK: Total CK: 483 U/L — ABNORMAL HIGH (ref 49–397)

## 2020-03-29 MED ORDER — SODIUM CHLORIDE 0.9 % IV BOLUS (SEPSIS)
1000.0000 mL | Freq: Once | INTRAVENOUS | Status: AC
  Administered 2020-03-29: 1000 mL via INTRAVENOUS

## 2020-03-29 MED ORDER — BACITRACIN ZINC 500 UNIT/GM EX OINT
TOPICAL_OINTMENT | Freq: Once | CUTANEOUS | Status: AC
  Administered 2020-03-29: 1 via TOPICAL
  Filled 2020-03-29: qty 1.8

## 2020-03-29 MED ORDER — TETANUS-DIPHTH-ACELL PERTUSSIS 5-2.5-18.5 LF-MCG/0.5 IM SUSY
0.5000 mL | PREFILLED_SYRINGE | Freq: Once | INTRAMUSCULAR | Status: AC
  Administered 2020-03-29: 0.5 mL via INTRAMUSCULAR
  Filled 2020-03-29: qty 0.5

## 2020-03-29 NOTE — ED Notes (Signed)
Pt given clean pants and dressed for discharge. Pt picked up by Ruthe Mannan, staff from [facility]. Pt following commands. Pt wheeled to front.

## 2020-03-29 NOTE — ED Provider Notes (Signed)
[facility] Emergency Department Provider Note   ____________________________________________   Event Date/Time   First MD Initiated Contact with Patient 03/29/20 0400     (approximate)  I have reviewed the triage vital signs and the nursing notes.   HISTORY  Chief Complaint Altered Mental Status  Level 5 caveat secondary to dementia  HPI Jeremy Christensen. is a 75 y.o. male with history of dementia, hypertension, seizures who was brought in by EMS after he was found outside walking alone with no shoes or socks on.  Patient tells me that "I guess I was just walking for too long".  He is unable to tell me where he came from.  We were able to call surrounding facilities and found the facility that patient was coming from.  They state they last checked on him around 1 AM and he was fine.  He has abrasions to multiple places on both feet from walking outside with no shoes on.  Unclear of last tetanus vaccination.  He denies any pain other than the discomfort in his feet.  They state he has not had any recent infectious symptoms.     Past Medical History:  Diagnosis Date  . Hypertension   . Seizures [facility])     Patient Active Problem List   Diagnosis Date Noted  . Seizure disorder (HCC) 10/09/2019  . Dementia with behavioral disturbance (HCC) 10/09/2019  . Elevated troponin 10/08/2019  . Essential hypertension 10/08/2019  . Acute confusion 01/20/2017  . Altered mental status 01/18/2017  . Pressure injury of skin 01/18/2017    History reviewed. No pertinent surgical history.  Prior to Admission medications   Medication Sig Start Date End Date Taking? Authorizing Provider  acetaminophen (TYLENOL) 325 MG tablet Take 650 mg by mouth every 6 (six) hours as needed for mild pain or fever.     [provider]  aluminum-magnesium hydroxide-simethicone (MAALOX) 200-200-20 MG/5ML SUSP Take 15-30 mLs by mouth at bedtime as needed (indigestion).      [provider]  Amantadine HCl 100 MG tablet Take 100 mg by mouth 2 (two) times daily.     [provider]  ARIPiprazole (ABILIFY) 10 MG tablet Take 10 mg by mouth at bedtime.     [provider]  donepezil (ARICEPT) 10 MG tablet Take 10 mg by mouth at bedtime.     [provider]  levETIRAcetam (KEPPRA) 500 MG tablet Take 1,500 mg by mouth 2 (two) times daily.  07/08/19   [provider]  loperamide (IMODIUM) 2 MG capsule Take 2 mg by mouth as needed for diarrhea or loose stools.     [provider]  magnesium hydroxide (PHILLIPS MILK OF MAGNESIA) 400 MG/5ML suspension Take 15-30 mLs by mouth at bedtime as needed for mild constipation or moderate constipation.     [provider]  tamsulosin (FLOMAX) 0.4 MG CAPS capsule Take 0.4 mg by mouth at bedtime.     [provider]  traZODone (DESYREL) 50 MG tablet Take 50 mg by mouth at bedtime.     [provider]  Vitamin D, Ergocalciferol, (DRISDOL) 1.25 MG (50000 UNIT) CAPS capsule Take 50,000 Units by mouth every 7 (seven) days.    [provider]    Allergies Tetracyclines & related  No family history on file.  Social History Social History   Tobacco Use  . Smoking status: Current Every Day Smoker    Types: Cigars  . Smokeless tobacco: Never Used  Substance Use Topics  . Alcohol use: No    Review of Systems  Level 5 caveat secondary to dementia  ____________________________________________   PHYSICAL EXAM:  VITAL SIGNS: ED Triage Vitals  Enc Vitals Group     BP 03/29/20 0403 (!) 148/90     Pulse Rate 03/29/20 0403 77     Resp 03/29/20 0403 20     Temp 03/29/20 0405 (!) 96.5 F (35.8 C)     Temp Source 03/29/20 0405 Rectal     SpO2 03/29/20 0403 99 %     Weight 03/29/20 0404 210 lb (95.3 kg)     Height 03/29/20 0404 6' (1.829 m)     Head Circumference --      Peak Flow --      Pain Score --      Pain Loc --      Pain Edu? --       Excl. in GC? --     Constitutional: Alert and oriented to person and place but not year or situation.  Elderly.  In no acute distress. Eyes: Conjunctivae are normal. PERRL. EOMI. Head: Atraumatic. Nose: No congestion/rhinnorhea. Mouth/Throat: Mucous membranes are moist.  Oropharynx non-erythematous. Neck: No stridor.  No midline spinal tenderness, step-off or deformity. Cardiovascular: Normal rate, regular rhythm. Grossly normal heart sounds.  Good peripheral circulation. Respiratory: Normal respiratory effort.  No retractions. Lungs CTAB. Gastrointestinal: Soft and nontender. No distention. No abdominal bruits. No CVA tenderness. Musculoskeletal: Abrasions to bilateral feet.  No lacerations.  2+ DP pulses bilaterally.  No joint effusions.  No bony deformity. Neurologic:  Normal speech and language. No gross focal neurologic deficits are appreciated. No gait instability. Skin:  Skin is cool to touch, dry. No rash noted. Psychiatric: Mood and affect are normal. Speech and behavior are normal.  ____________________________________________   LABS (all labs ordered are listed, but only abnormal results are displayed)  Labs Reviewed  CBC WITH DIFFERENTIAL/PLATELET  BASIC METABOLIC PANEL  CK  CBG MONITORING, ED   ____________________________________________  EKG  none ____________________________________________  RADIOLOGY  ED MD interpretation:  none  Official radiology report(s): No results found.  ____________________________________________   PROCEDURES  Procedure(s) performed: None  Procedures  Critical Care performed: yes  CRITICAL CARE Performed by: Rochele Raring   Total critical care time: 45 minutes  Critical care time was exclusive of separately billable procedures and treating other patients.  Critical care was necessary to treat or prevent imminent or life-threatening deterioration.  Critical care was time spent personally by me on the following  activities: development of treatment plan with patient and/or surrogate as well as nursing, discussions with consultants, evaluation of patient's response to treatment, examination of patient, obtaining history from patient or surrogate, ordering and performing treatments and interventions, ordering and review of laboratory studies, ordering and review of radiographic studies, pulse oximetry and re-evaluation of patient's condition.   ____________________________________________   INITIAL IMPRESSION / ASSESSMENT AND PLAN / ED COURSE  As part of my medical decision making, I reviewed the following data within the electronic MEDICAL RECORD NUMBER History obtained from family, Nursing notes reviewed and incorporated, Old chart reviewed and Notes from prior ED visits     Patient here after he was found wandering around outside by himself with no shoes or socks on.  No signs of trauma on exam other than abrasions to both feet.  He is cool to touch.  Rectal temp 96.5.  We will place him under a Lawyer.  Suspects this  is due to being outside in the cold for extended period of time.  Blood sugar normal.  He is otherwise well-appearing without complaints.  Will update tetanus vaccination.  Will check basic labs, CK.  We have been able to get in touch with members of the facility that patient lives at.  They will come to the ED to identify him.     Staff from patient's care facility at bedside.  I have updated with patient's test results.  It appears he has a very minimally elevated creatinine from baseline and a very minimally elevated CK level.  We will give a liter of IV fluids but anticipate discharge home.  Otherwise labs very reassuring.  Patient hemodynamically stable.   6:30 AM  Patient continues to be mildly hypothermic.  We will continue to keep him under a Bair hugger and recheck temperature in 1 to 2 hours from now.  Anticipate discharge back to his facility if this is improving.  I do not think  his hypothermia secondary to sepsis.  Suspect again that it is environmental in nature.   At this time, I do not feel there is any life-threatening condition present. I have reviewed, interpreted and discussed all results (EKG, imaging, lab, urine as appropriate) and exam findings with patient/family. I have reviewed nursing notes and appropriate previous records.  I feel the patient is safe to be discharged home without further emergent workup and can continue workup as an outpatient as needed. Discussed usual and customary return precautions. Patient/family verbalize understanding and are comfortable with this plan.  Outpatient follow-up has been provided as needed. All questions have been answered.  ____________________________________________   FINAL CLINICAL IMPRESSION(S) / ED DIAGNOSES  Final diagnoses:  Dementia without behavioral disturbance, unspecified dementia type (HCC)  Abrasions of multiple sites     ED Discharge Orders    None       Note:  This document was prepared using Dragon voice recognition software and may include unintentional dictation errors.    Keagan Brislin, Layla Maw, DO 03/29/20 [phone]

## 2020-03-29 NOTE — ED Notes (Signed)
Representative from [facility] at bedside. They state pt is only oriented to name at baseline. Last known time pt checked and in room was 0100 tonight. Unknown how long pt had been missing out of room.

## 2020-03-29 NOTE — ED Provider Notes (Signed)
-----------------------------------------   6:58 AM on 03/29/2020 -----------------------------------------  Blood pressure 116/73, pulse (!) 51, temperature (!) 96.7 F (35.9 C), temperature source Rectal, resp. rate 18, height 6' (1.829 m), weight 95.3 kg, SpO2 93 %.  Assuming care from Dr. Elesa Massed.  In short, Jeremy Christensen. is a 75 y.o. male with a chief complaint of Altered Mental Status .  Refer to the original H&P for additional details.  The current plan of care is to reassess for improvement in environmental hypothermia.  ----------------------------------------- 9:54 AM on 03/29/2020 -----------------------------------------  Patient's temperature has now normalized with rectal temp of 98.5.  Bear hugger was removed and patient continues to maintain normothermia.  On reassessment, he is alert and oriented to baseline, denies any complaints.  Given reassuring work-up, do not suspect sepsis or infectious process and his hypothermia does appear to be environmental.  He is appropriate for discharge back to nursing facility.    Chesley Noon, MD 03/29/20 1017

## 2020-03-29 NOTE — Discharge Instructions (Signed)
Patient's body temperature was lower likely being outside in the cold for several hours.  This has improved in the ED.  His labs are reassuring other than a mildly elevated CK level which is a sign of muscle breakdown which again can be caused from prolonged walking.  We have given him IV fluids for this.  This does not need to be rechecked unless you notice that his urine becomes very dark brown in appearance or he is unable to urinate in over 12 hours.  He does have abrasions to both feet.  These can be cleaned gently with warm soap and water and you may apply over-the-counter Neosporin ointment 1-2 times a day and clean bandages.  I recommend that he wear socks and shoes when walking to help protect these areas.  His tetanus vaccine has been updated today.

## 2020-03-29 NOTE — ED Notes (Signed)
[facility] called x2, no answer. Will attempt again.

## 2020-03-29 NOTE — ED Notes (Signed)
Bair hugger and warm blankets applied to pt.

## 2020-03-29 NOTE — ED Notes (Addendum)
Pt in bed, not attempting to get up. Pt wet with urine, gown and bedding and blanket changed. Bed alarm placed. Pt oriented to self only. Rectal temp 98.5. EDP notified. Bear hugger removed, pt given warm blankets.

## 2020-03-29 NOTE — ED Notes (Signed)
Pt feet cleansed with warm water and washcloths. Hydrogen peroxide used to cleanse wounds. Abrasions to first, second, and third toe on each foot. Bacitracin placed on wounds. 2x2 and wrap gauze placed on toes to cover wounds.

## 2020-03-29 NOTE — ED Triage Notes (Signed)
Pt found by bystander to be walking outside with no shoes or socks on. Pt alert and oriented only to self. Pt shaking and cold to touch. Pt complains of pain in feet. Abrasions noted to feet. Skin tear noted to arm.

## 2020-03-29 NOTE — ED Notes (Signed)
This RN spoke with  Staff member, Achilles Dunk, about pt who is resident at [facility] where pt is a resident. Report given. Pt to be picked up by staff from Care home.

## 2020-03-29 NOTE — ED Notes (Signed)
No legal guardian noted in file, no paperwork present per registration, [facility] of [facility] called, no answer, left message.

## 2020-08-04 ENCOUNTER — Emergency Department: Payer: Medicare Other

## 2020-08-04 ENCOUNTER — Emergency Department
Admission: EM | Admit: 2020-08-04 | Discharge: 2020-08-04 | Disposition: A | Discharge status: Home / Self Care | Payer: Medicare Other | Attending: Emergency Medicine | Admitting: Emergency Medicine

## 2020-08-04 DIAGNOSIS — F17290 Nicotine dependence, other tobacco product, uncomplicated: Secondary | ICD-10-CM | POA: Insufficient documentation

## 2020-08-04 DIAGNOSIS — F0391 Unspecified dementia with behavioral disturbance: Secondary | ICD-10-CM | POA: Insufficient documentation

## 2020-08-04 DIAGNOSIS — I10 Essential (primary) hypertension: Secondary | ICD-10-CM | POA: Insufficient documentation

## 2020-08-04 DIAGNOSIS — Z20822 Contact with and (suspected) exposure to covid-19: Secondary | ICD-10-CM | POA: Diagnosis not present

## 2020-08-04 DIAGNOSIS — R410 Disorientation, unspecified: Secondary | ICD-10-CM

## 2020-08-04 DIAGNOSIS — R4182 Altered mental status, unspecified: Secondary | ICD-10-CM | POA: Diagnosis present

## 2020-08-04 DIAGNOSIS — Z79899 Other long term (current) drug therapy: Secondary | ICD-10-CM | POA: Diagnosis not present

## 2020-08-04 LAB — COMPREHENSIVE METABOLIC PANEL
ALT: 18 U/L (ref 0–44)
AST: 33 U/L (ref 15–41)
Albumin: 4.1 g/dL (ref 3.5–5.0)
Alkaline Phosphatase: 95 U/L (ref 38–126)
Anion gap: 7 (ref 5–15)
BUN: 25 mg/dL — ABNORMAL HIGH (ref 8–23)
CO2: 28 mmol/L (ref 22–32)
Calcium: 8.9 mg/dL (ref 8.9–10.3)
Chloride: 106 mmol/L (ref 98–111)
Creatinine, Ser: 1.2 mg/dL (ref 0.61–1.24)
GFR, Estimated: >60 mL/min (ref >60)
Glucose, Bld: 89 mg/dL (ref 70–99)
Potassium: 3.7 mmol/L (ref 3.5–5.1)
Sodium: 141 mmol/L (ref 135–145)
Total Bilirubin: 2.1 mg/dL — ABNORMAL HIGH (ref 0.3–1.2)
Total Protein: 7 g/dL (ref 6.5–8.1)

## 2020-08-04 LAB — URINALYSIS, COMPLETE (UACMP) WITH MICROSCOPIC
Bacteria, UA: NONE SEEN
Bilirubin Urine: NEGATIVE
Glucose, UA: NEGATIVE mg/dL
Ketones, ur: 5 mg/dL — AB
Leukocytes,Ua: NEGATIVE
Nitrite: NEGATIVE
Protein, ur: NEGATIVE mg/dL
Specific Gravity, Urine: 1.021 (ref 1.005–1.030)
pH: 6 (ref 5.0–8.0)

## 2020-08-04 LAB — CBC WITH DIFFERENTIAL/PLATELET
Abs Immature Granulocytes: 0.02 10*3/uL (ref 0.00–0.07)
Basophils Absolute: 0 10*3/uL (ref 0.0–0.1)
Basophils Relative: 1 %
Eosinophils Absolute: 0.1 10*3/uL (ref 0.0–0.5)
Eosinophils Relative: 1 %
HCT: 41.5 % (ref 39.0–52.0)
Hemoglobin: 14.2 g/dL (ref 13.0–17.0)
Immature Granulocytes: 0 %
Lymphocytes Relative: 13 %
Lymphs Abs: 0.9 10*3/uL (ref 0.7–4.0)
MCH: 33.5 pg (ref 26.0–34.0)
MCHC: 34.2 g/dL (ref 30.0–36.0)
MCV: 97.9 fL (ref 80.0–100.0)
Monocytes Absolute: 0.7 10*3/uL (ref 0.1–1.0)
Monocytes Relative: 10 %
Neutro Abs: 5.3 10*3/uL (ref 1.7–7.7)
Neutrophils Relative %: 75 %
Platelets: 170 10*3/uL (ref 150–400)
RBC: 4.24 MIL/uL (ref 4.22–5.81)
RDW: 12.7 % (ref 11.5–15.5)
WBC: 7.1 10*3/uL (ref 4.0–10.5)
nRBC: 0 % (ref 0.0–0.2)

## 2020-08-04 LAB — RESP PANEL BY RT-PCR (FLU A&B, COVID) ARPGX2
Influenza A by PCR: NEGATIVE
Influenza B by PCR: NEGATIVE
SARS Coronavirus 2 by RT PCR: NEGATIVE

## 2020-08-04 LAB — TROPONIN I (HIGH SENSITIVITY)
Troponin I (High Sensitivity): 26 ng/L — ABNORMAL HIGH (ref <18)
Troponin I (High Sensitivity): 21 ng/L — ABNORMAL HIGH (ref <18)

## 2020-08-04 NOTE — Discharge Instructions (Addendum)
Please have him follow up with his primary care provider for additional symptoms.   Return to the ER for symptoms that change or worsen if unable to schedule an appointment.

## 2020-08-04 NOTE — ED Notes (Signed)
IV catheter removed intact without complication.  D/C instructions given to caretaker at d/c.  All questions addressed.  Understanding verbalized.  Pt left ER via w/c with caretaker.

## 2020-08-04 NOTE — ED Provider Notes (Signed)
[facility] Emergency Department Provider Note ____________________________________________   Event Date/Time   First MD Initiated Contact with Patient 08/04/20 1715     (approximate)  I have reviewed the triage vital signs and the nursing notes.   HISTORY  Chief Complaint Altered Mental Status  HPI Jeremy Christensen. is a 75 y.o. male with history of dementia with behavioral disturbance, acute confusion, altered mental status, seizure disorder, and hypertension presents to the emergency department for treatment and evaluation of a change in his mentation.  Per Calton Dach at his personal care home, he was not acting himself this morning.  He tried to hide under the bed, could not feed himself, and seemed to be more confused even with redirection in order to perform small tasks.  He has not been complaining of any pain, no recent seizures and has not had any recent medication changes.      Past Medical History:  Diagnosis Date  . Hypertension   . Seizures [facility])     Patient Active Problem List   Diagnosis Date Noted  . Seizure disorder (HCC) 10/09/2019  . Dementia with behavioral disturbance (HCC) 10/09/2019  . Elevated troponin 10/08/2019  . Essential hypertension 10/08/2019  . Acute confusion 01/20/2017  . Altered mental status 01/18/2017  . Pressure injury of skin 01/18/2017    No past surgical history on file.  Prior to Admission medications   Medication Sig Start Date End Date Taking? Authorizing Provider  acetaminophen (TYLENOL) 325 MG tablet Take 650 mg by mouth every 6 (six) hours as needed for mild pain or fever.     [provider]  aluminum-magnesium hydroxide-simethicone (MAALOX) 200-200-20 MG/5ML SUSP Take 15-30 mLs by mouth at bedtime as needed (indigestion).     [provider]  Amantadine HCl 100 MG tablet Take 100 mg by mouth 2 (two) times daily.     [provider]  ARIPiprazole (ABILIFY) 10 MG tablet  Take 10 mg by mouth at bedtime.     [provider]  donepezil (ARICEPT) 10 MG tablet Take 10 mg by mouth at bedtime.     [provider]  levETIRAcetam (KEPPRA) 500 MG tablet Take 1,500 mg by mouth 2 (two) times daily.  07/08/19   [provider]  loperamide (IMODIUM) 2 MG capsule Take 2 mg by mouth as needed for diarrhea or loose stools.     [provider]  magnesium hydroxide (PHILLIPS MILK OF MAGNESIA) 400 MG/5ML suspension Take 15-30 mLs by mouth at bedtime as needed for mild constipation or moderate constipation.     [provider]  tamsulosin (FLOMAX) 0.4 MG CAPS capsule Take 0.4 mg by mouth at bedtime.     [provider]  traZODone (DESYREL) 50 MG tablet Take 50 mg by mouth at bedtime.     [provider]  Vitamin D, Ergocalciferol, (DRISDOL) 1.25 MG (50000 UNIT) CAPS capsule Take 50,000 Units by mouth every 7 (seven) days.    [provider]    Allergies Tetracyclines & related  No family history on file.  Social History Social History   Tobacco Use  . Smoking status: Current Every Day Smoker    Types: Cigars  . Smokeless tobacco: Never Used  Substance Use Topics  . Alcohol use: No    Review of Systems  Level 5 caveat: Altered mental status ____________________________________________   PHYSICAL EXAM:  VITAL SIGNS: ED Triage Vitals  Enc Vitals Group     BP 08/04/20 1420  116/63     Pulse Rate 08/04/20 1420 62     Resp 08/04/20 1420 18     Temp 08/04/20 1420 98.6 F (37 C)     Temp Source 08/04/20 1417 Oral     SpO2 08/04/20 1420 96 %     Weight 08/04/20 1423 210 lb 1.6 oz (95.3 kg)     Height 08/04/20 1423 5\' 8"  (1.727 m)     Head Circumference --      Peak Flow --      Pain Score --      Pain Loc --      Pain Edu? --      Excl. in GC? --     Constitutional: Confused, chronically ill-appearing.   Eyes: Conjunctivae are normal. PERRL. Head: Atraumatic. Nose: No  congestion/rhinnorhea. Mouth/Throat: Mucous membranes are moist. Neck: No stridor.   Hematological/Lymphatic/Immunilogical: No cervical lymphadenopathy. Cardiovascular: Normal rate, regular rhythm. Grossly normal heart sounds.  Good peripheral circulation. Respiratory: Normal respiratory effort.  No retractions. Lungs CTAB. Gastrointestinal: Soft and nontender. No distention. No abdominal bruits. Genitourinary:  Musculoskeletal: No lower extremity edema.  No joint effusions. Neurologic:   No gross focal neurologic deficits are appreciated. Shuffled gait. Follows simple commands such as raise arms, reach for handrails, pick up ECG cables, move each foot on command.. Able to state name.  No facial droop.  Skin:  Skin is warm, dry and intact. No rash noted. Psychiatric:Calm, quiet  ____________________________________________   LABS (all labs ordered are listed, but only abnormal results are displayed)  Labs Reviewed  COMPREHENSIVE METABOLIC PANEL - Abnormal; Notable for the following components:      Result Value   BUN 25 (*)    Total Bilirubin 2.1 (*)    All other components within normal limits  URINALYSIS, COMPLETE (UACMP) WITH MICROSCOPIC - Abnormal; Notable for the following components:   Color, Urine YELLOW (*)    APPearance CLEAR (*)    Hgb urine dipstick SMALL (*)    Ketones, ur 5 (*)    All other components within normal limits  TROPONIN I (HIGH SENSITIVITY) - Abnormal; Notable for the following components:   Troponin I (High Sensitivity) 26 (*)    All other components within normal limits  TROPONIN I (HIGH SENSITIVITY) - Abnormal; Notable for the following components:   Troponin I (High Sensitivity) 21 (*)    All other components within normal limits  RESP PANEL BY RT-PCR (FLU A&B, COVID) ARPGX2  CBC WITH DIFFERENTIAL/PLATELET   ____________________________________________  EKG  ED ECG REPORT I, Ayad Nieman, FNP-BC personally viewed and interpreted this ECG.    Date: 08/04/2020  EKG Time: 1554  Rate: 48  Rhythm: sinus bradycardia with prolonged QT.  Axis: normal  Intervals:none  ST&T Change: None  ____________________________________________  RADIOLOGY  ED MD interpretation:    Chest x-ray negative for acute cardiopulmonary abnormality.  CT head negative for acute concerns per radiology.   I, 08/06/2020, personally viewed and evaluated these images (plain radiographs) as part of my medical decision making, as well as reviewing the written report by the radiologist.  Official radiology report(s): DG Chest 2 View  Result Date: 08/04/2020 CLINICAL DATA:  Altered mental status. EXAM: CHEST - 2 VIEW COMPARISON:  January 10, 2020. FINDINGS: The heart size and mediastinal contours are within normal limits. Both lungs are clear. The visualized skeletal structures are unremarkable. IMPRESSION: No active cardiopulmonary disease. Electronically Signed   By: January 12, 2020 M.D.   On: 08/04/2020 15:19  CT Head Wo Contrast  Result Date: 08/04/2020 CLINICAL DATA:  Altered mental status. EXAM: CT HEAD WITHOUT CONTRAST TECHNIQUE: Contiguous axial images were obtained from the base of the skull through the vertex without intravenous contrast. COMPARISON:  January 10, 2020. FINDINGS: Brain: Mild chronic ischemic white matter disease is noted. No mass effect or midline shift is noted. Ventricular size is within normal limits. There is no evidence of mass lesion, hemorrhage or acute infarction. Vascular: No hyperdense vessel or unexpected calcification. Skull: Normal. Negative for fracture or focal lesion. Sinuses/Orbits: No acute finding. Other: None. IMPRESSION: No acute intracranial abnormality seen. Electronically Signed   By: Lupita Raider M.D.   On: 08/04/2020 15:18    ____________________________________________   PROCEDURES  Procedure(s) performed (including Critical  Care):  Procedures  ____________________________________________   INITIAL IMPRESSION / ASSESSMENT AND PLAN     75 year old male presenting to the emergency department via EMS from his care home for symptoms as described in the HPI.  Upon entering room to assess the patient, he was standing attempting to provide a urine specimen.  RN assisted him back in to bed.  He was noted to have a shuffled gait.  Will work-up for altered mental status.  DIFFERENTIAL DIAGNOSIS  Electrolyte imbalance, urinary tract infection, CVA, cardiac event  ED COURSE  Serial troponins without indication of ischemia.  CT head and chest x-ray negative for acute concerns.  Urinalysis is normal.  CBC and CMP are unremarkable.  COVID-19 and influenza AMB are both negative as well.  Work-up is negative.  Clements with the personal-care home was contacted.  He was advised that increasing confusion most likely related to worsening dementia.  He will come transport the patient back to the care home.  He was encouraged to have follow-up with the primary care provider this week.  For symptoms that change, worsen, or for new concerns he was advised to send him back to the emergency department.    ___________________________________________   FINAL CLINICAL IMPRESSION(S) / ED DIAGNOSES  Final diagnoses:  Confusion  Dementia with behavioral disturbance, unspecified dementia type [facility])     ED Discharge Orders    None       Jeremy Hensley. was evaluated in Emergency Department on 08/04/2020 for the symptoms described in the history of present illness. He was evaluated in the context of the global COVID-19 pandemic, which necessitated consideration that the patient might be at risk for infection with the SARS-CoV-2 virus that causes COVID-19. Institutional protocols and algorithms that pertain to the evaluation of patients at risk for COVID-19 are in a state of rapid change based on information released by  regulatory bodies including the CDC and federal and state organizations. These policies and algorithms were followed during the patient's care in the ED.   Note:  This document was prepared using Dragon voice recognition software and may include unintentional dictation errors.   Chinita Pester, FNP 08/04/20 2010    Concha Se, MD 08/05/20 [phone]

## 2020-08-04 NOTE — ED Triage Notes (Signed)
Pt brought in via EMS from [facility] group home.  Facility states pt is altered at baseline, but is more-so than usual.  They also state that pt has increased weakness in ability to ambulate on his own, and is refusing to eat without assistance.

## 2020-09-16 ENCOUNTER — Inpatient Hospital Stay
Admission: EM | Admit: 2020-09-16 | Discharge: 2020-09-19 | DRG: 640 | Disposition: A | Discharge status: Home / Self Care | Payer: Medicare Other | Attending: Family Medicine | Admitting: Family Medicine

## 2020-09-16 ENCOUNTER — Other Ambulatory Visit: Payer: Self-pay

## 2020-09-16 ENCOUNTER — Emergency Department: Payer: Medicare Other

## 2020-09-16 ENCOUNTER — Observation Stay: Payer: Medicare Other

## 2020-09-16 DIAGNOSIS — N400 Enlarged prostate without lower urinary tract symptoms: Secondary | ICD-10-CM | POA: Diagnosis present

## 2020-09-16 DIAGNOSIS — G20A1 Parkinson's disease without dyskinesia, without mention of fluctuations: Secondary | ICD-10-CM

## 2020-09-16 DIAGNOSIS — E876 Hypokalemia: Secondary | ICD-10-CM | POA: Diagnosis present

## 2020-09-16 DIAGNOSIS — F0281 Dementia in other diseases classified elsewhere with behavioral disturbance: Secondary | ICD-10-CM | POA: Diagnosis present

## 2020-09-16 DIAGNOSIS — R001 Bradycardia, unspecified: Secondary | ICD-10-CM | POA: Diagnosis present

## 2020-09-16 DIAGNOSIS — E860 Dehydration: Principal | ICD-10-CM | POA: Diagnosis present

## 2020-09-16 DIAGNOSIS — G4700 Insomnia, unspecified: Secondary | ICD-10-CM | POA: Diagnosis present

## 2020-09-16 DIAGNOSIS — G250 Essential tremor: Secondary | ICD-10-CM

## 2020-09-16 DIAGNOSIS — F39 Unspecified mood [affective] disorder: Secondary | ICD-10-CM | POA: Diagnosis present

## 2020-09-16 DIAGNOSIS — R17 Unspecified jaundice: Secondary | ICD-10-CM

## 2020-09-16 DIAGNOSIS — Z79899 Other long term (current) drug therapy: Secondary | ICD-10-CM

## 2020-09-16 DIAGNOSIS — G20 Parkinson's disease: Secondary | ICD-10-CM | POA: Diagnosis present

## 2020-09-16 DIAGNOSIS — R4182 Altered mental status, unspecified: Secondary | ICD-10-CM | POA: Diagnosis present

## 2020-09-16 DIAGNOSIS — F17290 Nicotine dependence, other tobacco product, uncomplicated: Secondary | ICD-10-CM | POA: Diagnosis present

## 2020-09-16 DIAGNOSIS — G40909 Epilepsy, unspecified, not intractable, without status epilepticus: Secondary | ICD-10-CM | POA: Diagnosis present

## 2020-09-16 DIAGNOSIS — I10 Essential (primary) hypertension: Secondary | ICD-10-CM | POA: Diagnosis present

## 2020-09-16 DIAGNOSIS — G9341 Metabolic encephalopathy: Secondary | ICD-10-CM | POA: Diagnosis present

## 2020-09-16 DIAGNOSIS — F02818 Dementia in other diseases classified elsewhere, unspecified severity, with other behavioral disturbance: Secondary | ICD-10-CM

## 2020-09-16 DIAGNOSIS — F0390 Unspecified dementia without behavioral disturbance: Secondary | ICD-10-CM | POA: Diagnosis present

## 2020-09-16 DIAGNOSIS — Z20822 Contact with and (suspected) exposure to covid-19: Secondary | ICD-10-CM | POA: Diagnosis present

## 2020-09-16 HISTORY — DX: Dementia in other diseases classified elsewhere, unspecified severity, without behavioral disturbance, psychotic disturbance, mood disturbance, and anxiety: F02.80

## 2020-09-16 HISTORY — DX: Unspecified dementia, unspecified severity, without behavioral disturbance, psychotic disturbance, mood disturbance, and anxiety: F03.90

## 2020-09-16 LAB — COMPREHENSIVE METABOLIC PANEL
ALT: 15 U/L (ref 0–44)
AST: 19 U/L (ref 15–41)
Albumin: 4.2 g/dL (ref 3.5–5.0)
Alkaline Phosphatase: 95 U/L (ref 38–126)
Anion gap: 6 (ref 5–15)
BUN: 20 mg/dL (ref 8–23)
CO2: 25 mmol/L (ref 22–32)
Calcium: 8.8 mg/dL — ABNORMAL LOW (ref 8.9–10.3)
Chloride: 111 mmol/L (ref 98–111)
Creatinine, Ser: 0.94 mg/dL (ref 0.61–1.24)
GFR, Estimated: >60 mL/min (ref >60)
Glucose, Bld: 113 mg/dL — ABNORMAL HIGH (ref 70–99)
Potassium: 3.9 mmol/L (ref 3.5–5.1)
Sodium: 142 mmol/L (ref 135–145)
Total Bilirubin: 1.1 mg/dL (ref 0.3–1.2)
Total Protein: 6.9 g/dL (ref 6.5–8.1)

## 2020-09-16 LAB — CBC
HCT: 43.4 % (ref 39.0–52.0)
Hemoglobin: 14.3 g/dL (ref 13.0–17.0)
MCH: 32.2 pg (ref 26.0–34.0)
MCHC: 32.9 g/dL (ref 30.0–36.0)
MCV: 97.7 fL (ref 80.0–100.0)
Platelets: 184 10*3/uL (ref 150–400)
RBC: 4.44 MIL/uL (ref 4.22–5.81)
RDW: 12.7 % (ref 11.5–15.5)
WBC: 6.9 10*3/uL (ref 4.0–10.5)
nRBC: 0 % (ref 0.0–0.2)

## 2020-09-16 LAB — CBG MONITORING, ED: Glucose-Capillary: 88 mg/dL (ref 70–99)

## 2020-09-16 LAB — CK: Total CK: 140 U/L (ref 49–397)

## 2020-09-16 LAB — RESP PANEL BY RT-PCR (FLU A&B, COVID) ARPGX2
Influenza A by PCR: NEGATIVE
Influenza B by PCR: NEGATIVE
SARS Coronavirus 2 by RT PCR: NEGATIVE

## 2020-09-16 LAB — TSH: TSH: 5.642 u[IU]/mL — ABNORMAL HIGH (ref 0.350–4.500)

## 2020-09-16 LAB — AMMONIA: Ammonia: 15 umol/L (ref 9–35)

## 2020-09-16 MED ORDER — ARIPIPRAZOLE 10 MG PO TABS
10.0000 mg | ORAL_TABLET | Freq: Every day | ORAL | Status: DC
  Administered 2020-09-17 – 2020-09-18 (×2): 10 mg via ORAL
  Filled 2020-09-16 (×4): qty 1

## 2020-09-16 MED ORDER — ONDANSETRON HCL 4 MG/2ML IJ SOLN: 4.0000 mg | Freq: Four times a day (QID) | INTRAMUSCULAR | Status: DC | PRN

## 2020-09-16 MED ORDER — LEVETIRACETAM 500 MG PO TABS: 1500.0000 mg | ORAL_TABLET | Freq: Two times a day (BID) | ORAL | Status: DC

## 2020-09-16 MED ORDER — ONDANSETRON HCL 4 MG PO TABS: 4.0000 mg | ORAL_TABLET | Freq: Four times a day (QID) | ORAL | Status: DC | PRN

## 2020-09-16 MED ORDER — TAMSULOSIN HCL 0.4 MG PO CAPS
0.4000 mg | ORAL_CAPSULE | Freq: Every day | ORAL | Status: DC
  Administered 2020-09-18: 22:00:00 0.4 mg via ORAL
  Filled 2020-09-16: qty 1

## 2020-09-16 MED ORDER — ACETAMINOPHEN 325 MG PO TABS: 650.0000 mg | ORAL_TABLET | Freq: Four times a day (QID) | ORAL | Status: DC | PRN

## 2020-09-16 MED ORDER — ENOXAPARIN SODIUM 40 MG/0.4ML IJ SOSY: 40.0000 mg | PREFILLED_SYRINGE | INTRAMUSCULAR | Status: DC

## 2020-09-16 MED ORDER — ACETAMINOPHEN 650 MG RE SUPP: 650.0000 mg | Freq: Four times a day (QID) | RECTAL | Status: DC | PRN

## 2020-09-16 MED ORDER — AMANTADINE HCL 100 MG PO CAPS
100.0000 mg | ORAL_CAPSULE | Freq: Two times a day (BID) | ORAL | Status: DC
  Administered 2020-09-17 – 2020-09-19 (×5): 100 mg via ORAL
  Filled 2020-09-16 (×7): qty 1

## 2020-09-16 MED ORDER — LAMOTRIGINE 100 MG PO TABS
150.0000 mg | ORAL_TABLET | Freq: Two times a day (BID) | ORAL | Status: DC
  Administered 2020-09-17 (×2): 150 mg via ORAL
  Filled 2020-09-16 (×2): qty 1

## 2020-09-16 MED ORDER — DONEPEZIL HCL 5 MG PO TABS
10.0000 mg | ORAL_TABLET | Freq: Every day | ORAL | Status: DC
  Administered 2020-09-17 – 2020-09-18 (×2): 10 mg via ORAL
  Filled 2020-09-16 (×2): qty 2

## 2020-09-16 MED ORDER — LORAZEPAM 2 MG/ML IJ SOLN: 1.0000 mg | INTRAMUSCULAR | Status: DC | PRN

## 2020-09-16 NOTE — H&P (Addendum)
History and Physical   Jeremy Christensen. PVV:[id number] DOB: 12-Dec-1945 DOA: 09/16/2020  PCP: Preston Fleeting, MD  Outpatient Specialists: Dr. Alvira Philips, neurology Patient coming from: [facility]  I have personally briefly reviewed patient's old medical records in [facility] EMR.  Chief Concern: Change in mental status  HPI: Jeremy Christensen. is a 75 y.o. male with medical history significant for moderate to advanceddementia, BPH, history of seizures, presents to the emergency department for chief concerns of change in mental status and patient was found on the floor at group home.  Per report, EDP states that the group home facility director states that patient has become nonverbal in the last couple of days.  At bedside patient is sitting up with hips and knees flexed on the bed.  He tracks me around the bed with his eyes.  When I ask him if he can lift of his arms he just stares at me.  He can clearly squeeze his fingers.  Social history: Patient lives at a group home, [facility].  DSS is patient's guardian  Vaccination history: Patient is vaccinated for COVID-19, 3 shots, unsure if Pfizer or Moderna  ROS: Unable to complete due to patient's dementia  ED Course: Discussed with ED provider, patient requiring hospitalization for chief concerns of altered mentation.  Vitals in the emergency department was remarkable for temperature of 98.7, respiration rate of 14, heart rate 72, blood pressure 137/80, SPO2 of 96% on room air.  Labs was remarkable for sodium 142, potassium 3.9, chloride 111, bicarb 25, BUN 20, serum creatinine is 0.94, nonfasting blood glucose 113, CK1 40, WBC 6.9, hemoglobin 14.3, platelets 184.  ED provider ordered CT of the head and cervical spine was was negative for intracranial and acute injury of the cervical spine.  No medical intervention was provided by EDP.  Assessment/Plan  Principal Problem:   Altered mental status Active  Problems:   Essential hypertension   Seizure disorder (HCC)   Dementia (HCC)   Dementia due to Parkinson's disease with behavioral disturbance (HCC)   Essential tremor   # Change in mental status # Patient nonverbal # Patient does not meet sepsis criteria - Query progression of advanced dementia to nonverbal state - MRI of the brain has been ordered, abdominal x-ray, chest x-ray - Portable chest x-ray and abdominal x-ray has been ordered - UA ordered and pending - Ammonia ordered - Keppra and TSH level pending - CK was 140 - Trazodone has been held due to altered mentation - SLP - N.p.o. except for sips with meds and ice chips - Fall precautions - I would recommend that if it MRI of the brain is negative and UA is negative would recommend for discharge -If MRI and UA are negative I would recommend education to the group home the patient has advanced dementia that has progressed to nonverbal and withdrawn state  # History of seizures-no witnessed seizure prior to presentation - Keppra 1500 mg p.o. twice daily, lamotrigine 150 mg p.o. twice daily - Ativan 1 mg IV every 2 hours as needed for seizures - Seizure precaution  # Insomnia-trazodone nightly has been held  # Advanced dementia-donepezil 10 mg nightly, amantadine 100 mg twice daily  # Psychiatric imbalance-Abilify 10 mg nightly  # Sinus bradycardia-at baseline  COVID PCR/influenza A/pending collection  Chart reviewed.   DVT prophylaxis: Enoxaparin 40 mg subcutaneous every 24 hours Code Status: Full code Diet: N.p.o. pending SLP Family Communication: No Disposition Plan: Pending clinical course  Consults called: None at this time Admission status: MedSurg, ops, with telemetry  Past Medical History:  Diagnosis Date   Hypertension    Seizures (HCC)    History reviewed. No pertinent surgical history.  Social History:  reports that he has been smoking cigars. He has never used smokeless tobacco. He reports that  he does not drink alcohol. No history on file for drug use.  Allergies  Allergen Reactions   Tetracyclines & Related Hives   History reviewed. No pertinent family history. Family history: Family history reviewed and not pertinent  Prior to Admission medications   Medication Sig Start Date End Date Taking? Authorizing Provider  acetaminophen (TYLENOL) 325 MG tablet Take 650 mg by mouth every 6 (six) hours as needed for mild pain or fever.     [provider]  aluminum-magnesium hydroxide-simethicone (MAALOX) 200-200-20 MG/5ML SUSP Take 15-30 mLs by mouth at bedtime as needed (indigestion).     [provider]  Amantadine HCl 100 MG tablet Take 100 mg by mouth 2 (two) times daily.     [provider]  ARIPiprazole (ABILIFY) 10 MG tablet Take 10 mg by mouth at bedtime.     [provider]  donepezil (ARICEPT) 10 MG tablet Take 10 mg by mouth at bedtime.     [provider]  levETIRAcetam (KEPPRA) 500 MG tablet Take 1,500 mg by mouth 2 (two) times daily.  07/08/19   [provider]  loperamide (IMODIUM) 2 MG capsule Take 2 mg by mouth as needed for diarrhea or loose stools.     [provider]  magnesium hydroxide (PHILLIPS MILK OF MAGNESIA) 400 MG/5ML suspension Take 15-30 mLs by mouth at bedtime as needed for mild constipation or moderate constipation.     [provider]  tamsulosin (FLOMAX) 0.4 MG CAPS capsule Take 0.4 mg by mouth at bedtime.     [provider]  traZODone (DESYREL) 50 MG tablet Take 50 mg by mouth at bedtime.     [provider]  Vitamin D, Ergocalciferol, (DRISDOL) 1.25 MG (50000 UNIT) CAPS capsule Take 50,000 Units by mouth every 7 (seven) days.    [provider]   Physical Exam: Vitals:   09/16/20 1605 09/16/20 1939  BP: 137/80 (!) 156/88  Pulse: 72 70  Resp: 14 18  Temp: 98.7 F (37.1 C)   TempSrc: Oral   SpO2: 96% 95%   Constitutional: appears age-appropriate,  NAD, calm, comfortable Eyes: Bilateral pinpoint pupils, lids and conjunctivae normal ENMT: Mucous membranes are moist.  Patient will not open mouth to assess oral mucosa and pharyngeal mucosa.  Unable to fully assess hearing Neck: normal, supple, no masses, no thyromegaly Respiratory: clear to auscultation bilaterally, no wheezing, no crackles. Normal respiratory effort. No accessory muscle use.  Cardiovascular: Regular rate and rhythm, no murmurs / rubs / gallops. No extremity edema. 2+ pedal pulses. No carotid bruits.  Abdomen: Obese abdomen, no tenderness, no masses palpated, no hepatosplenomegaly. Bowel sounds positive.  Musculoskeletal: no clubbing / cyanosis. No joint deformity upper and lower extremities. no contractures, no atrophy. Normal muscle tone.  Patient will not move upper extremities.  Patient sitting up in ED bed with hips flexed and knees flexed.  No apparent muscle loss. Skin: no rashes, lesions, ulcers. No induration Neurologic: Sensation intact. Strength 5/5 in all 4.  Psychiatric: Unable to assess judgment and insight.  Alert and and looks at me and tracks his eyes.  Flat affect.   EKG: independently reviewed, showing sinus  bradycardia with rate of 48, QTc 431 unchanged from previous EKG noted on 08/04/2020  Chest x-ray on Admission: I personally reviewed and I agree with radiologist reading as below.  CT Head Wo Contrast  Result Date: 09/16/2020 CLINICAL DATA:  75 year old male with altered mental status and fall. EXAM: CT HEAD WITHOUT CONTRAST CT CERVICAL SPINE WITHOUT CONTRAST TECHNIQUE: Multidetector CT imaging of the head and cervical spine was performed following the standard protocol without intravenous contrast. Multiplanar CT image reconstructions of the cervical spine were also generated. COMPARISON:  08/04/2020 CT and prior studies FINDINGS: CT HEAD FINDINGS Brain: No evidence of acute infarction, hemorrhage, hydrocephalus, extra-axial collection or mass lesion/mass  effect. Atrophy and chronic small-vessel white matter ischemic changes are again noted. Vascular: No hyperdense vessel or unexpected calcification. Skull: Normal. Negative for fracture or focal lesion. Sinuses/Orbits: No acute finding. Other: None. CT CERVICAL SPINE FINDINGS Alignment: Normal. Skull base and vertebrae: No acute fracture. No primary bone lesion or focal pathologic process. Soft tissues and spinal canal: No prevertebral fluid or swelling. No visible canal hematoma. Disc levels: Mild to moderate multilevel degenerative disc disease/spondylosis noted. Upper chest: No acute abnormality Other: None IMPRESSION: 1. No evidence of acute intracranial abnormality. Atrophy and chronic small-vessel white matter ischemic changes. 2. No static evidence of acute injury to the cervical spine. Electronically Signed   By: Harmon PierJeffrey  Hu M.D.   On: 09/16/2020 17:09   CT Cervical Spine Wo Contrast  Result Date: 09/16/2020 CLINICAL DATA:  75 year old male with altered mental status and fall. EXAM: CT HEAD WITHOUT CONTRAST CT CERVICAL SPINE WITHOUT CONTRAST TECHNIQUE: Multidetector CT imaging of the head and cervical spine was performed following the standard protocol without intravenous contrast. Multiplanar CT image reconstructions of the cervical spine were also generated. COMPARISON:  08/04/2020 CT and prior studies FINDINGS: CT HEAD FINDINGS Brain: No evidence of acute infarction, hemorrhage, hydrocephalus, extra-axial collection or mass lesion/mass effect. Atrophy and chronic small-vessel white matter ischemic changes are again noted. Vascular: No hyperdense vessel or unexpected calcification. Skull: Normal. Negative for fracture or focal lesion. Sinuses/Orbits: No acute finding. Other: None. CT CERVICAL SPINE FINDINGS Alignment: Normal. Skull base and vertebrae: No acute fracture. No primary bone lesion or focal pathologic process. Soft tissues and spinal canal: No prevertebral fluid or swelling. No visible canal  hematoma. Disc levels: Mild to moderate multilevel degenerative disc disease/spondylosis noted. Upper chest: No acute abnormality Other: None IMPRESSION: 1. No evidence of acute intracranial abnormality. Atrophy and chronic small-vessel white matter ischemic changes. 2. No static evidence of acute injury to the cervical spine. Electronically Signed   By: Harmon PierJeffrey  Hu M.D.   On: 09/16/2020 17:09    Labs on Admission: I have personally reviewed following labs  CBC: Recent Labs  Lab 09/16/20 1614  WBC 6.9  HGB 14.3  HCT 43.4  MCV 97.7  PLT 184   Basic Metabolic Panel: Recent Labs  Lab 09/16/20 1614  NA 142  K 3.9  CL 111  CO2 25  GLUCOSE 113*  BUN 20  CREATININE 0.94  CALCIUM 8.8*   GFR: CrCl cannot be calculated (Unknown ideal weight.). Liver Function Tests: Recent Labs  Lab 09/16/20 1614  AST 19  ALT 15  ALKPHOS 95  BILITOT 1.1  PROT 6.9  ALBUMIN 4.2   Cardiac Enzymes: Recent Labs  Lab 09/16/20 1614  CKTOTAL 140   Urine analysis:    Component Value Date/Time   COLORURINE YELLOW (A) 08/04/2020 1541   APPEARANCEUR CLEAR (A) 08/04/2020 1541  LABSPEC 1.021 08/04/2020 1541   PHURINE 6.0 08/04/2020 1541   GLUCOSEU NEGATIVE 08/04/2020 1541   HGBUR SMALL (A) 08/04/2020 1541   BILIRUBINUR NEGATIVE 08/04/2020 1541   KETONESUR 5 (A) 08/04/2020 1541   PROTEINUR NEGATIVE 08/04/2020 1541   NITRITE NEGATIVE 08/04/2020 1541   LEUKOCYTESUR NEGATIVE 08/04/2020 1541   Dr. Sedalia Muta [facility]  If 7PM-7AM, please contact overnight-coverage provider If 7AM-7PM, please contact day coverage provider [email or web address]  09/16/2020, 8:46 PM

## 2020-09-16 NOTE — ED Provider Notes (Signed)
[facility] Emergency Department Provider Note   ____________________________________________   Event Date/Time   First MD Initiated Contact with Patient 09/16/20 2009     (approximate)  I have reviewed the triage vital signs and the nursing notes.   HISTORY  Chief Complaint Fall and Altered Mental Status  Caveat: Nonverbal, altered mental status  HPI Jeremy Christensen. is a 75 y.o. male    Per Doristine Mango (owner of group home) advises episodes of shaking (tremor, hand) and confused. Normally baseline is will verbalize, but today was very quite and non-verbal, seems staring off. The tremors got better if they would rub his shoulders. He seemed to briefly 'pass out' for 30 seconds this morning.   Caretaker reports similar situation occurred in May.  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 No pertinent surgical history.  Prior to Admission medications   Medication Sig Start Date End Date Taking? Authorizing Provider  acetaminophen (TYLENOL) 325 MG tablet Take 650 mg by mouth every 6 (six) hours as needed for mild pain or fever.     [provider]  aluminum-magnesium hydroxide-simethicone (MAALOX) 200-200-20 MG/5ML SUSP Take 15-30 mLs by mouth at bedtime as needed (indigestion).     [provider]  Amantadine HCl 100 MG tablet Take 100 mg by  mouth 2 (two) times daily.     [provider]  ARIPiprazole (ABILIFY) 10 MG tablet Take 10 mg by mouth at bedtime.     [provider]  donepezil (ARICEPT) 10 MG tablet Take 10 mg by mouth at bedtime.     [provider]  levETIRAcetam (KEPPRA) 500 MG tablet Take 1,500 mg by mouth 2 (two) times daily.  07/08/19   [provider]  loperamide (IMODIUM) 2 MG capsule Take 2 mg by mouth as needed for diarrhea or loose stools.     [provider]  magnesium hydroxide (PHILLIPS MILK OF MAGNESIA) 400 MG/5ML suspension Take 15-30 mLs by mouth at bedtime as needed for mild constipation or moderate constipation.     [provider]  tamsulosin (FLOMAX) 0.4 MG CAPS capsule Take 0.4 mg by mouth at bedtime.     [provider]  traZODone (DESYREL) 50 MG tablet Take 50 mg by mouth at bedtime.     [provider]  Vitamin D, Ergocalciferol, (DRISDOL) 1.25 MG (50000 UNIT) CAPS capsule Take 50,000 Units by mouth every 7 (seven) days.    [provider]    Allergies Tetracyclines & related  History reviewed. No pertinent family history.  Social History Social History   Tobacco Use   Smoking status: Every Day    Pack years: 0.00    Types: Cigars   Smokeless tobacco: Never  Substance Use Topics   Alcohol use: No    Review of Systems  Per caretaker, Doristine Mango, he advises no recent illness other than when he came  to the hospital in May where he had a somewhat similar change with some confusion   ____________________________________________   PHYSICAL EXAM:  VITAL SIGNS: ED Triage Vitals [09/16/20 1605]  Enc Vitals Group     BP 137/80     Pulse Rate 72     Resp 14     Temp 98.7 F (37.1 C)     Temp Source Oral     SpO2 96 %     Weight      Height      Head Circumference      Peak Flow      Pain Score      Pain Loc      Pain Edu?      Excl. in GC?     Constitutional: Alert and stares forward, unable to  assess orientation.  Will turn his head and track examiner from location to location in the room. Eyes: Conjunctivae are normal.  Conjugate gaze Head: Atraumatic. Nose: No congestion/rhinnorhea. Mouth/Throat: Mucous membranes are moist. Neck: No stridor.  Cardiovascular: Normal rate, regular rhythm. Grossly normal heart sounds.  Good peripheral circulation. Respiratory: Normal respiratory effort.  No retractions. Lungs CTAB. Gastrointestinal: Soft and nontender. No distention. Musculoskeletal: No lower extremity tenderness nor edema.  Mild somewhat low-frequency tremor in the left upper arm.  Almost has a parkinsonian-like appearance.  Does not appear consistent with a seizure.  Seems to get better when using the hand. Neurologic: Nonverbal.  Generally looks forward, staring forward.  Will turn his head and track examiner.  Able to raise his hands and will slowly lower them with equal strength.  Difficult to assess strength in the lower extremities, no obvious inequality of strength.   Skin:  Skin is warm, dry and intact. No rash noted. Psychiatric: Mood and affect are very calm very flat.  ____________________________________________   LABS (all labs ordered are listed, but only abnormal results are displayed)  Labs Reviewed  COMPREHENSIVE METABOLIC PANEL - Abnormal; Notable for the following components:      Result Value   Glucose, Bld 113 (*)    Calcium 8.8 (*)    All other components within normal limits  TSH - Abnormal; Notable for the following components:   TSH 5.642 (*)    All other components within normal limits  RESP PANEL BY RT-PCR (FLU A&B, COVID) ARPGX2  CBC  CK  AMMONIA  URINALYSIS, COMPLETE (UACMP) WITH MICROSCOPIC  LEVETIRACETAM LEVEL  BASIC METABOLIC PANEL  CBC  CBG MONITORING, ED  CBG MONITORING, ED   ____________________________________________  EKG  Reviewed inter by me at 2035 Heart rate 69 QRS 110 QTc 430 Probable sinus rhythm, patient does have  some tremulousness which I suspect is causing some artifact, possible old anteroseptal infarct.  No evidence of acute ischemia ____________________________________________  RADIOLOGY  CT Head Wo Contrast  Result Date: 09/16/2020 CLINICAL DATA:  75 year old male with altered mental status and fall. EXAM: CT HEAD WITHOUT CONTRAST CT CERVICAL SPINE WITHOUT CONTRAST TECHNIQUE: Multidetector CT imaging of the head and cervical spine was performed following the standard protocol without intravenous contrast. Multiplanar CT image reconstructions of the cervical spine were also generated. COMPARISON:  08/04/2020 CT and prior studies FINDINGS: CT HEAD FINDINGS Brain: No evidence of acute infarction, hemorrhage, hydrocephalus, extra-axial collection or mass lesion/mass effect. Atrophy and chronic small-vessel white matter ischemic changes are again noted. Vascular: No hyperdense vessel or unexpected calcification. Skull: Normal. Negative for fracture or focal lesion. Sinuses/Orbits: No acute finding. Other: None. CT CERVICAL  SPINE FINDINGS Alignment: Normal. Skull base and vertebrae: No acute fracture. No primary bone lesion or focal pathologic process. Soft tissues and spinal canal: No prevertebral fluid or swelling. No visible canal hematoma. Disc levels: Mild to moderate multilevel degenerative disc disease/spondylosis noted. Upper chest: No acute abnormality Other: None IMPRESSION: 1. No evidence of acute intracranial abnormality. Atrophy and chronic small-vessel white matter ischemic changes. 2. No static evidence of acute injury to the cervical spine. Electronically Signed   By: Harmon Pier M.D.   On: 09/16/2020 17:09   CT Cervical Spine Wo Contrast  Result Date: 09/16/2020 CLINICAL DATA:  75 year old male with altered mental status and fall. EXAM: CT HEAD WITHOUT CONTRAST CT CERVICAL SPINE WITHOUT CONTRAST TECHNIQUE: Multidetector CT imaging of the head and cervical spine was performed following the standard  protocol without intravenous contrast. Multiplanar CT image reconstructions of the cervical spine were also generated. COMPARISON:  08/04/2020 CT and prior studies FINDINGS: CT HEAD FINDINGS Brain: No evidence of acute infarction, hemorrhage, hydrocephalus, extra-axial collection or mass lesion/mass effect. Atrophy and chronic small-vessel white matter ischemic changes are again noted. Vascular: No hyperdense vessel or unexpected calcification. Skull: Normal. Negative for fracture or focal lesion. Sinuses/Orbits: No acute finding. Other: None. CT CERVICAL SPINE FINDINGS Alignment: Normal. Skull base and vertebrae: No acute fracture. No primary bone lesion or focal pathologic process. Soft tissues and spinal canal: No prevertebral fluid or swelling. No visible canal hematoma. Disc levels: Mild to moderate multilevel degenerative disc disease/spondylosis noted. Upper chest: No acute abnormality Other: None IMPRESSION: 1. No evidence of acute intracranial abnormality. Atrophy and chronic small-vessel white matter ischemic changes. 2. No static evidence of acute injury to the cervical spine. Electronically Signed   By: Harmon Pier M.D.   On: 09/16/2020 17:09   MR BRAIN WO CONTRAST  Result Date: 09/16/2020 CLINICAL DATA:  Encephalopathy EXAM: MRI HEAD WITHOUT CONTRAST TECHNIQUE: Multiplanar, multiecho pulse sequences of the brain and surrounding structures were obtained without intravenous contrast. COMPARISON:  None. FINDINGS: Brain: No acute infarct, mass effect or extra-axial collection. No acute or chronic hemorrhage. Hyperintense T2-weighted signal is moderately widespread throughout the white matter. Generalized volume loss without a clear lobar predilection. The midline structures are normal. Vascular: Major flow voids are preserved. Skull and upper cervical spine: Normal calvarium and skull base. Visualized upper cervical spine and soft tissues are normal. Sinuses/Orbits:No paranasal sinus fluid levels or  advanced mucosal thickening. No mastoid or middle ear effusion. Normal orbits. IMPRESSION: 1. No acute intracranial abnormality. 2. Generalized volume loss and findings of chronic microvascular ischemia. Electronically Signed   By: Deatra Robinson M.D.   On: 09/16/2020 22:48   DG Chest Port 1 View  Result Date: 09/16/2020 CLINICAL DATA:  Found down EXAM: PORTABLE CHEST 1 VIEW COMPARISON:  08/04/2020 FINDINGS: Lungs are clear.  No pleural effusion or pneumothorax. The heart is normal in size. IMPRESSION: No evidence of acute cardiopulmonary disease. Electronically Signed   By: Charline Bills M.D.   On: 09/16/2020 21:45   DG Abd Portable 1V  Result Date: 09/16/2020 CLINICAL DATA:  Found down EXAM: PORTABLE ABDOMEN - 1 VIEW COMPARISON:  01/19/2017 FINDINGS: Nonobstructive bowel gas pattern. Mild degenerative changes of the lumbar spine. Visualized bony pelvis appears intact. IMPRESSION: Unremarkable abdominal radiograph. Electronically Signed   By: Charline Bills M.D.   On: 09/16/2020 21:45    Imaging studies reviewed CT of the head negative for acute finding.  Cervical spine negative for acute finding ____________________________________________   PROCEDURES  Procedure(s)  performed: None  Procedures  Critical Care performed: No  ____________________________________________   INITIAL IMPRESSION / ASSESSMENT AND PLAN / ED COURSE  Pertinent labs & imaging results that were available during my care of the patient were reviewed by me and considered in my medical decision making (see chart for details).   Patient presents for change in mental status.  Today apparently had an episode where he had a brief syncope or unresponsiveness.  Had an unwitnessed fall today.  Does have a history of seizures but no obvious evidence of acute seizure though postictal state would be considered.  He does not demonstrate any seizure-like activity at this time but has low-frequency tremulousness in the left hand  but not consistent with seizure-like movement pattern.  He tracks examiner from 1 side to the other.  He however does remain nonverbal.  Appears that he has been compliant with the use of his seizure medications.  There is no fever no obvious neurologic deficits.  Is resting calmly but very stoically almost catatonic and away.  ----------------------------------------- 8:26 PM on 09/16/2020 ----------------------------------------- Work-up to this point including CBC CK metabolic panel unremarkable.  Await TSH, ammonia, EKG.  Anticipate admission to the hospitalist service for further work-up of altered mental status.  Admission discussed with Dr. Sedalia Muta   ____________________________________________   FINAL CLINICAL IMPRESSION(S) / ED DIAGNOSES  Final diagnoses:  Altered mental status        Note:  This document was prepared using Dragon voice recognition software and may include unintentional dictation errors       Sharyn Creamer, MD 09/16/20 2303

## 2020-09-16 NOTE — ED Triage Notes (Signed)
Pt in via EMS from a group home, [facility] with c/o   CNA found pt in the [place], assumed he had been there for about 20 minutes. Pt has been confused sine yesterday and has not been able to feed himself today. EMS reports pt with pin point pupils. Pt also with hx of seizures but staff are unsure if he had a seizure of just fell because it was unwitnessed. EMS reports pt with hx of tremors in the hands as well.  136/80, HR 76, FSBS 115, 94% RA

## 2020-09-16 NOTE — ED Notes (Signed)
Socks placed on pt.  

## 2020-09-16 NOTE — ED Notes (Signed)
Pt sitting by security desk in lobby, frequently trying to get up from wheelchair and having to be reminded to sit back down.

## 2020-09-16 NOTE — ED Provider Notes (Signed)
Emergency Medicine Provider Triage Evaluation Note  Jeremy Christensen. , a 75 y.o. male  was evaluated in triage.  Pt here for evaluation of AMS and unwitnessed fall. Patient lives at group home and was noted to be more altered yesterday, worse today and would not feed himself. Has hx of seizures and had unwitnessed fall. They are unsure if this was due to seizure or altered status. Patient is unresponsive to voice at this time but is responsive to light touch. Unknown pts baseline status  Review of Systems  Positive: Altered status, unwitnessed fall Negative: Limited by patient's inability to answer questions  Physical Exam  BP 137/80   Pulse 72   Temp 98.7 F (37.1 C) (Oral)   Resp 14   SpO2 96%  Gen:   Somnolent, slumped in wheel chair Resp:  Normal effort Other:  Supporting airway despite altered status, appears stable  Medical Decision Making  Medically screening exam initiated at 4:09 PM.  Appropriate orders placed.  [name]. was informed that the remainder of the evaluation will be completed by another provider, this initial triage assessment does not replace that evaluation, and the importance of remaining in the ED until their evaluation is complete.    Lucy Chris, PA 09/16/20 1615    Sharyn Creamer, MD 09/16/20 [phone]

## 2020-09-17 ENCOUNTER — Encounter: Payer: Self-pay | Admitting: Internal Medicine

## 2020-09-17 DIAGNOSIS — R4182 Altered mental status, unspecified: Secondary | ICD-10-CM | POA: Diagnosis present

## 2020-09-17 DIAGNOSIS — F0281 Dementia in other diseases classified elsewhere with behavioral disturbance: Secondary | ICD-10-CM | POA: Diagnosis present

## 2020-09-17 DIAGNOSIS — G20 Parkinson's disease: Secondary | ICD-10-CM | POA: Diagnosis present

## 2020-09-17 DIAGNOSIS — I10 Essential (primary) hypertension: Secondary | ICD-10-CM | POA: Diagnosis present

## 2020-09-17 DIAGNOSIS — F39 Unspecified mood [affective] disorder: Secondary | ICD-10-CM | POA: Diagnosis present

## 2020-09-17 DIAGNOSIS — N400 Enlarged prostate without lower urinary tract symptoms: Secondary | ICD-10-CM | POA: Diagnosis present

## 2020-09-17 DIAGNOSIS — G4700 Insomnia, unspecified: Secondary | ICD-10-CM | POA: Diagnosis present

## 2020-09-17 DIAGNOSIS — E876 Hypokalemia: Secondary | ICD-10-CM | POA: Diagnosis present

## 2020-09-17 DIAGNOSIS — Z20822 Contact with and (suspected) exposure to covid-19: Secondary | ICD-10-CM | POA: Diagnosis present

## 2020-09-17 DIAGNOSIS — G9341 Metabolic encephalopathy: Secondary | ICD-10-CM | POA: Diagnosis present

## 2020-09-17 DIAGNOSIS — E860 Dehydration: Secondary | ICD-10-CM | POA: Diagnosis present

## 2020-09-17 DIAGNOSIS — G250 Essential tremor: Secondary | ICD-10-CM | POA: Diagnosis present

## 2020-09-17 DIAGNOSIS — G40909 Epilepsy, unspecified, not intractable, without status epilepticus: Secondary | ICD-10-CM | POA: Diagnosis present

## 2020-09-17 DIAGNOSIS — R001 Bradycardia, unspecified: Secondary | ICD-10-CM | POA: Diagnosis present

## 2020-09-17 DIAGNOSIS — Z79899 Other long term (current) drug therapy: Secondary | ICD-10-CM | POA: Diagnosis not present

## 2020-09-17 DIAGNOSIS — F17290 Nicotine dependence, other tobacco product, uncomplicated: Secondary | ICD-10-CM | POA: Diagnosis present

## 2020-09-17 LAB — COMPREHENSIVE METABOLIC PANEL
ALT: 14 U/L (ref 0–44)
AST: 22 U/L (ref 15–41)
Albumin: 3.9 g/dL (ref 3.5–5.0)
Alkaline Phosphatase: 94 U/L (ref 38–126)
Anion gap: 9 (ref 5–15)
BUN: 17 mg/dL (ref 8–23)
CO2: 23 mmol/L (ref 22–32)
Calcium: 8.3 mg/dL — ABNORMAL LOW (ref 8.9–10.3)
Chloride: 106 mmol/L (ref 98–111)
Creatinine, Ser: 0.94 mg/dL (ref 0.61–1.24)
GFR, Estimated: >60 mL/min (ref >60)
Glucose, Bld: 89 mg/dL (ref 70–99)
Potassium: 3.4 mmol/L — ABNORMAL LOW (ref 3.5–5.1)
Sodium: 138 mmol/L (ref 135–145)
Total Bilirubin: 1.5 mg/dL — ABNORMAL HIGH (ref 0.3–1.2)
Total Protein: 6.5 g/dL (ref 6.5–8.1)

## 2020-09-17 LAB — URINALYSIS, COMPLETE (UACMP) WITH MICROSCOPIC
Bacteria, UA: NONE SEEN
Bilirubin Urine: NEGATIVE
Glucose, UA: NEGATIVE mg/dL
Ketones, ur: 5 mg/dL — AB
Leukocytes,Ua: NEGATIVE
Nitrite: NEGATIVE
Protein, ur: NEGATIVE mg/dL
Specific Gravity, Urine: 1.023 (ref 1.005–1.030)
Squamous Epithelial / HPF: NONE SEEN (ref 0–5)
pH: 5 (ref 5.0–8.0)

## 2020-09-17 LAB — CBC WITH DIFFERENTIAL/PLATELET
Abs Immature Granulocytes: 0.01 10*3/uL (ref 0.00–0.07)
Basophils Absolute: 0 10*3/uL (ref 0.0–0.1)
Basophils Relative: 1 %
Eosinophils Absolute: 0.1 10*3/uL (ref 0.0–0.5)
Eosinophils Relative: 2 %
HCT: 41.6 % (ref 39.0–52.0)
Hemoglobin: 14.1 g/dL (ref 13.0–17.0)
Immature Granulocytes: 0 %
Lymphocytes Relative: 17 %
Lymphs Abs: 1.1 10*3/uL (ref 0.7–4.0)
MCH: 32.7 pg (ref 26.0–34.0)
MCHC: 33.9 g/dL (ref 30.0–36.0)
MCV: 96.5 fL (ref 80.0–100.0)
Monocytes Absolute: 0.6 10*3/uL (ref 0.1–1.0)
Monocytes Relative: 9 %
Neutro Abs: 4.8 10*3/uL (ref 1.7–7.7)
Neutrophils Relative %: 71 %
Platelets: 166 10*3/uL (ref 150–400)
RBC: 4.31 MIL/uL (ref 4.22–5.81)
RDW: 12.7 % (ref 11.5–15.5)
WBC: 6.7 10*3/uL (ref 4.0–10.5)
nRBC: 0 % (ref 0.0–0.2)

## 2020-09-17 LAB — FOLATE: Folate: 17 ng/mL (ref >5.9)

## 2020-09-17 LAB — VITAMIN B12: Vitamin B-12: 697 pg/mL (ref 180–914)

## 2020-09-17 MED ORDER — SODIUM CHLORIDE 0.9 % IV SOLN
200.0000 mg | Freq: Once | INTRAVENOUS | Status: AC
  Administered 2020-09-17: 19:00:00 200 mg via INTRAVENOUS
  Filled 2020-09-17: qty 20

## 2020-09-17 MED ORDER — LEVETIRACETAM IN NACL 1000 MG/100ML IV SOLN
1000.0000 mg | Freq: Every day | INTRAVENOUS | Status: DC
  Filled 2020-09-17: qty 100

## 2020-09-17 MED ORDER — LEVETIRACETAM 750 MG PO TABS
1500.0000 mg | ORAL_TABLET | Freq: Every day | ORAL | Status: DC
  Administered 2020-09-17: 1500 mg via ORAL
  Filled 2020-09-17: qty 2
  Filled 2020-09-17: qty 3

## 2020-09-17 MED ORDER — LORAZEPAM 2 MG/ML IJ SOLN
0.5000 mg | INTRAMUSCULAR | Status: DC | PRN
  Administered 2020-09-17 – 2020-09-18 (×2): 0.5 mg via INTRAVENOUS
  Filled 2020-09-17 (×2): qty 1

## 2020-09-17 MED ORDER — ENOXAPARIN SODIUM 60 MG/0.6ML IJ SOSY
0.5000 mg/kg | PREFILLED_SYRINGE | INTRAMUSCULAR | Status: DC
  Administered 2020-09-17 – 2020-09-19 (×3): 47.5 mg via SUBCUTANEOUS
  Filled 2020-09-17 (×3): qty 0.6

## 2020-09-17 MED ORDER — LEVETIRACETAM 500 MG PO TABS
1000.0000 mg | ORAL_TABLET | Freq: Every day | ORAL | Status: DC
  Administered 2020-09-17: 1000 mg via ORAL
  Filled 2020-09-17: qty 2

## 2020-09-17 MED ORDER — HALOPERIDOL LACTATE 5 MG/ML IJ SOLN
1.0000 mg | Freq: Four times a day (QID) | INTRAMUSCULAR | Status: DC | PRN
  Administered 2020-09-17: 1 mg via INTRAMUSCULAR
  Filled 2020-09-17: qty 1

## 2020-09-17 MED ORDER — LEVETIRACETAM IN NACL 1500 MG/100ML IV SOLN
1500.0000 mg | Freq: Every day | INTRAVENOUS | Status: DC
  Administered 2020-09-17: 1500 mg via INTRAVENOUS
  Filled 2020-09-17 (×2): qty 100

## 2020-09-17 MED ORDER — NYSTATIN 100000 UNIT/GM EX POWD
Freq: Three times a day (TID) | CUTANEOUS | Status: DC
  Filled 2020-09-17: qty 15

## 2020-09-17 MED ORDER — SODIUM CHLORIDE 0.9 % IV SOLN
100.0000 mg | Freq: Two times a day (BID) | INTRAVENOUS | Status: DC
  Filled 2020-09-17 (×2): qty 10

## 2020-09-17 NOTE — ED Notes (Signed)
Brief and sweatpants are wet with urine. Pt cleansed, primo fit placed, will send urine sample when available.

## 2020-09-17 NOTE — Progress Notes (Addendum)
PROGRESS NOTE    Jeremy NickelsWilliam Henry Shipton Jr.  Jeremy Christensen DOB: March 05, 1946 DOA: 09/16/2020 PCP: Jeremy Christensen, Jeremy Mancheno, MD   Chief Complaint  Patient presents with   Fall   Altered Mental Status   Brief Narrative:  Jeremy NickelsWilliam Henry Porco Jr. is Jeremy Christensen 75 y.o. male with medical history significant for moderate to advanceddementia, BPH, history of seizures, presents to the emergency department for chief concerns of change in mental status and patient was found on the floor at group home.   He's been admitted with concern for AMS.   Assessment & Plan:   Principal Problem:   Altered mental status Active Problems:   Essential hypertension   Seizure disorder (HCC)   Dementia (HCC)   Dementia due to Parkinson's disease with behavioral disturbance (HCC)   Essential tremor  # Acute Metabolic Encephalopathy - reportedly nonverbal at presentation (Jeremy Christensen change from baseline), seems to be improving - unclear etiology - pt with hx of seizures, ? If this may have been cause.  Ddx includes medications (on ativan, abilify, etc), infection (no obvious source at this time), progression of dementia - MRI brain without acute abnormality - head CT without evidence of acute intracranial abnormality - unremarkable abdominal radiograph - UA not c/w infection - Ammonia wnl, TSH mildly elevated, follow b12, folate - Keppra level pending - CK was 140 - Trazodone has been held due to altered mentation - EEG pending given hx seizures - N.p.o. except for sips with meds and ice chips until cleared by speech - Fall precautions   # History of seizures-no witnessed seizure prior to presentation - follows with neurology outpatient, "focal onset with generalization vs complex partial seizure" - Keppra 1500 mg p.o. twice daily, lamotrigine 150 mg p.o. twice daily -- transition keppra to IV while NPO  -- discussed with neurology informally, will substitute vimpat for lamictal while NPO -- follow EEG  - Ativan 1 mg IV  every 2 hours as needed for seizures - Seizure precaution   # Insomnia-trazodone nightly has been held   # Advanced dementia-donepezil 10 mg nightly, amantadine 100 mg twice daily - when able to take PO  # Psychiatric imbalance-Abilify 10 mg nightly when able to take PO   # Sinus bradycardia-at baseline  Patient has guardian, DSS  DVT prophylaxis: lovenox Code Status: full  Family Communication: none at bedside Disposition:   Status is: Observation  The patient will require care spanning > 2 midnights and should be moved to inpatient because: Inpatient level of care appropriate due to severity of illness  Dispo: The patient is from: group home              Anticipated d/c is to: grup home              Patient currently is not medically stable to d/c.   Difficult to place patient No       Consultants:  none  Procedures:  none  Antimicrobials:  Anti-infectives (From admission, onward)    None          Subjective: No new complaints  Objective: Vitals:   09/17/20 0730 09/17/20 0829 09/17/20 1317 09/17/20 1354  BP: (!) 135/94  125/68 125/77  Pulse:  79 (!) 59 (!) 59  Resp: 17 13 14 16   Temp:  98.1 F (36.7 C) 98 F (36.7 C) 98.1 F (36.7 C)  TempSrc:  Oral Oral Oral  SpO2: 96% 97% 100% 96%  Weight:      Height:  No intake or output data in the 24 hours ending 09/17/20 1531 Filed Weights   09/17/20 0617  Weight: 95 kg    Examination:  General exam: Appears calm and comfortable  Respiratory system: Clear to auscultation. Respiratory effort normal. Cardiovascular system: S1 & S2 heard, RRR. Gastrointestinal system: Abdomen is nondistended, soft and nontender  Central nervous system: Alert, but confused - he's speaking this morning, difficult to understand at times, but answers some questions appropriately.  Moving all extremities.  Extremities:  no LEE Skin: No rashes, lesions or ulcers  Data Reviewed: I have personally reviewed following  labs and imaging studies  CBC: Recent Labs  Lab 09/16/20 1614 09/17/20 0837  WBC 6.9 6.7  NEUTROABS  --  4.8  HGB 14.3 14.1  HCT 43.4 41.6  MCV 97.7 96.5  PLT 184 166    Basic Metabolic Panel: Recent Labs  Lab 09/16/20 1614 09/17/20 0837  NA 142 138  K 3.9 3.4*  CL 111 106  CO2 25 23  GLUCOSE 113* 89  BUN 20 17  CREATININE 0.94 0.94  CALCIUM 8.8* 8.3*    GFR: Estimated Creatinine Clearance: 78.2 mL/min (by C-G formula based on SCr of 0.94 mg/dL).  Liver Function Tests: Recent Labs  Lab 09/16/20 1614 09/17/20 0837  AST 19 22  ALT 15 14  ALKPHOS 95 94  BILITOT 1.1 1.5*  PROT 6.9 6.5  ALBUMIN 4.2 3.9    CBG: Recent Labs  Lab 09/16/20 2042  GLUCAP 88     Recent Results (from the past 240 hour(s))  Resp Panel by RT-PCR (Flu Ronrico Dupin&B, Covid) Nasopharyngeal Swab     Status: None   Collection Time: 09/16/20  8:18 PM   Specimen: Nasopharyngeal Swab; Nasopharyngeal(NP) swabs in vial transport medium  Result Value Ref Range Status   SARS Coronavirus 2 by RT PCR NEGATIVE NEGATIVE Final    Comment: (NOTE) SARS-CoV-2 target nucleic acids are NOT DETECTED.  The SARS-CoV-2 RNA is generally detectable in upper respiratory specimens during the acute phase of infection. The lowest concentration of SARS-CoV-2 viral copies this assay can detect is 138 copies/mL. Nikolina Simerson negative result does not preclude SARS-Cov-2 infection and should not be used as the sole basis for treatment or other patient management decisions. Martinez Boxx negative result may occur with  improper specimen collection/handling, submission of specimen other than nasopharyngeal swab, presence of viral mutation(s) within the areas targeted by this assay, and inadequate number of viral copies(<138 copies/mL). Neeka Urista negative result must be combined with clinical observations, patient history, and epidemiological information. The expected result is Negative.  Fact Sheet for Patients:   BloggerCourse.com  Fact Sheet for Healthcare Providers:  SeriousBroker.it  This test is no t yet approved or cleared by the [place] FDA and  has been authorized for detection and/or diagnosis of SARS-CoV-2 by FDA under an Emergency Use Authorization (EUA). This EUA will remain  in effect (meaning this test can be used) for the duration of the COVID-19 declaration under Section 564(b)(1) of the Act, 21 U.S.C.section 360bbb-3(b)(1), unless the authorization is terminated  or revoked sooner.       Influenza Bhumi Godbey by PCR NEGATIVE NEGATIVE Final   Influenza B by PCR NEGATIVE NEGATIVE Final    Comment: (NOTE) The Xpert Xpress SARS-CoV-2/FLU/RSV plus assay is intended as an aid in the diagnosis of influenza from Nasopharyngeal swab specimens and should not be used as Arrayah Connors sole basis for treatment. Nasal washings and aspirates are unacceptable for Xpert Xpress SARS-CoV-2/FLU/RSV testing.  Fact Sheet  for Patients: BloggerCourse.com  Fact Sheet for Healthcare Providers: SeriousBroker.it  This test is not yet approved or cleared by the [place] FDA and has been authorized for detection and/or diagnosis of SARS-CoV-2 by FDA under an Emergency Use Authorization (EUA). This EUA will remain in effect (meaning this test can be used) for the duration of the COVID-19 declaration under Section 564(b)(1) of the Act, 21 U.S.C. section 360bbb-3(b)(1), unless the authorization is terminated or revoked.  Performed at [facility], [address], [place], [place] [address]          Radiology Studies: CT Head Wo Contrast  Result Date: 09/16/2020 CLINICAL DATA:  75 year old male with altered mental status and fall. EXAM: CT HEAD WITHOUT CONTRAST CT CERVICAL SPINE WITHOUT CONTRAST TECHNIQUE: Multidetector CT imaging of the head and cervical spine was performed following the standard  protocol without intravenous contrast. Multiplanar CT image reconstructions of the cervical spine were also generated. COMPARISON:  08/04/2020 CT and prior studies FINDINGS: CT HEAD FINDINGS Brain: No evidence of acute infarction, hemorrhage, hydrocephalus, extra-axial collection or mass lesion/mass effect. Atrophy and chronic small-vessel white matter ischemic changes are again noted. Vascular: No hyperdense vessel or unexpected calcification. Skull: Normal. Negative for fracture or focal lesion. Sinuses/Orbits: No acute finding. Other: None. CT CERVICAL SPINE FINDINGS Alignment: Normal. Skull base and vertebrae: No acute fracture. No primary bone lesion or focal pathologic process. Soft tissues and spinal canal: No prevertebral fluid or swelling. No visible canal hematoma. Disc levels: Mild to moderate multilevel degenerative disc disease/spondylosis noted. Upper chest: No acute abnormality Other: None IMPRESSION: 1. No evidence of acute intracranial abnormality. Atrophy and chronic small-vessel white matter ischemic changes. 2. No static evidence of acute injury to the cervical spine. Electronically Signed   By: Harmon Pier M.D.   On: 09/16/2020 17:09   CT Cervical Spine Wo Contrast  Result Date: 09/16/2020 CLINICAL DATA:  75 year old male with altered mental status and fall. EXAM: CT HEAD WITHOUT CONTRAST CT CERVICAL SPINE WITHOUT CONTRAST TECHNIQUE: Multidetector CT imaging of the head and cervical spine was performed following the standard protocol without intravenous contrast. Multiplanar CT image reconstructions of the cervical spine were also generated. COMPARISON:  08/04/2020 CT and prior studies FINDINGS: CT HEAD FINDINGS Brain: No evidence of acute infarction, hemorrhage, hydrocephalus, extra-axial collection or mass lesion/mass effect. Atrophy and chronic small-vessel white matter ischemic changes are again noted. Vascular: No hyperdense vessel or unexpected calcification. Skull: Normal. Negative for  fracture or focal lesion. Sinuses/Orbits: No acute finding. Other: None. CT CERVICAL SPINE FINDINGS Alignment: Normal. Skull base and vertebrae: No acute fracture. No primary bone lesion or focal pathologic process. Soft tissues and spinal canal: No prevertebral fluid or swelling. No visible canal hematoma. Disc levels: Mild to moderate multilevel degenerative disc disease/spondylosis noted. Upper chest: No acute abnormality Other: None IMPRESSION: 1. No evidence of acute intracranial abnormality. Atrophy and chronic small-vessel white matter ischemic changes. 2. No static evidence of acute injury to the cervical spine. Electronically Signed   By: Harmon Pier M.D.   On: 09/16/2020 17:09   MR BRAIN WO CONTRAST  Result Date: 09/16/2020 CLINICAL DATA:  Encephalopathy EXAM: MRI HEAD WITHOUT CONTRAST TECHNIQUE: Multiplanar, multiecho pulse sequences of the brain and surrounding structures were obtained without intravenous contrast. COMPARISON:  None. FINDINGS: Brain: No acute infarct, mass effect or extra-axial collection. No acute or chronic hemorrhage. Hyperintense T2-weighted signal is moderately widespread throughout the white matter. Generalized volume loss without Winthrop Shannahan clear lobar predilection. The midline structures are  normal. Vascular: Major flow voids are preserved. Skull and upper cervical spine: Normal calvarium and skull base. Visualized upper cervical spine and soft tissues are normal. Sinuses/Orbits:No paranasal sinus fluid levels or advanced mucosal thickening. No mastoid or middle ear effusion. Normal orbits. IMPRESSION: 1. No acute intracranial abnormality. 2. Generalized volume loss and findings of chronic microvascular ischemia. Electronically Signed   By: Deatra Robinson M.D.   On: 09/16/2020 22:48   DG Chest Port 1 View  Result Date: 09/16/2020 CLINICAL DATA:  Found down EXAM: PORTABLE CHEST 1 VIEW COMPARISON:  08/04/2020 FINDINGS: Lungs are clear.  No pleural effusion or pneumothorax. The heart is  normal in size. IMPRESSION: No evidence of acute cardiopulmonary disease. Electronically Signed   By: Charline Bills M.D.   On: 09/16/2020 21:45   DG Abd Portable 1V  Result Date: 09/16/2020 CLINICAL DATA:  Found down EXAM: PORTABLE ABDOMEN - 1 VIEW COMPARISON:  01/19/2017 FINDINGS: Nonobstructive bowel gas pattern. Mild degenerative changes of the lumbar spine. Visualized bony pelvis appears intact. IMPRESSION: Unremarkable abdominal radiograph. Electronically Signed   By: Charline Bills M.D.   On: 09/16/2020 21:45        Scheduled Meds:  amantadine  100 mg Oral BID   ARIPiprazole  10 mg Oral QHS   donepezil  10 mg Oral QHS   enoxaparin (LOVENOX) injection  0.5 mg/kg Subcutaneous Q24H   lamoTRIgine  150 mg Oral BID   levETIRAcetam  1,000 mg Oral Daily   And   levETIRAcetam  1,500 mg Oral QHS   nystatin   Topical TID   tamsulosin  0.4 mg Oral QHS   Continuous Infusions:   LOS: 0 days    Time spent: over 30 min    Lacretia Nicks, MD [facility]   To contact the attending provider between 7A-7P or the covering provider during after hours 7P-7A, please log into the web site [email or web address] and access using universal [place] password for that web site. If you do not have the password, please call the hospital operator.  09/17/2020, 3:31 PM

## 2020-09-17 NOTE — Progress Notes (Signed)
Patient arrived to unit in stable condition. Pt responsive to voice, but not interactive. VSS.

## 2020-09-17 NOTE — Progress Notes (Signed)
PHARMACIST - PHYSICIAN COMMUNICATION  CONCERNING:  Enoxaparin (Lovenox) for DVT Prophylaxis    RECOMMENDATION: Patient was prescribed enoxaprin 40mg  q24 hours for VTE prophylaxis.   Filed Weights   09/17/20 0617  Weight: 95 kg (209 lb 7 oz)    Body mass index is 31.84 kg/m.  Estimated Creatinine Clearance: 78.2 mL/min (by C-G formula based on SCr of 0.94 mg/dL).   Based on [facility] policy patient is candidate for enoxaparin 0.5mg /kg TBW SQ every 24 hours based on BMI being >30.  DESCRIPTION: Pharmacy has adjusted enoxaparin dose per [facility] policy.  Patient is now receiving enoxaparin 0.5 mg/kg every 24 hours   [facility], PharmD, [facility] 09/17/2020 6:20 AM

## 2020-09-18 ENCOUNTER — Inpatient Hospital Stay: Payer: Medicare Other

## 2020-09-18 LAB — CBC WITH DIFFERENTIAL/PLATELET
Abs Immature Granulocytes: 0.01 10*3/uL (ref 0.00–0.07)
Basophils Absolute: 0 10*3/uL (ref 0.0–0.1)
Basophils Relative: 1 %
Eosinophils Absolute: 0.1 10*3/uL (ref 0.0–0.5)
Eosinophils Relative: 2 %
HCT: 39.3 % (ref 39.0–52.0)
Hemoglobin: 13.6 g/dL (ref 13.0–17.0)
Immature Granulocytes: 0 %
Lymphocytes Relative: 19 %
Lymphs Abs: 1.3 10*3/uL (ref 0.7–4.0)
MCH: 33.6 pg (ref 26.0–34.0)
MCHC: 34.6 g/dL (ref 30.0–36.0)
MCV: 97 fL (ref 80.0–100.0)
Monocytes Absolute: 0.5 10*3/uL (ref 0.1–1.0)
Monocytes Relative: 8 %
Neutro Abs: 4.7 10*3/uL (ref 1.7–7.7)
Neutrophils Relative %: 70 %
Platelets: 188 10*3/uL (ref 150–400)
RBC: 4.05 MIL/uL — ABNORMAL LOW (ref 4.22–5.81)
RDW: 12.7 % (ref 11.5–15.5)
WBC: 6.7 10*3/uL (ref 4.0–10.5)
nRBC: 0 % (ref 0.0–0.2)

## 2020-09-18 LAB — COMPREHENSIVE METABOLIC PANEL
ALT: 16 U/L (ref 0–44)
AST: 18 U/L (ref 15–41)
Albumin: 3.7 g/dL (ref 3.5–5.0)
Alkaline Phosphatase: 99 U/L (ref 38–126)
Anion gap: 5 (ref 5–15)
BUN: 23 mg/dL (ref 8–23)
CO2: 28 mmol/L (ref 22–32)
Calcium: 8.6 mg/dL — ABNORMAL LOW (ref 8.9–10.3)
Chloride: 105 mmol/L (ref 98–111)
Creatinine, Ser: 1.36 mg/dL — ABNORMAL HIGH (ref 0.61–1.24)
GFR, Estimated: 55 mL/min — ABNORMAL LOW (ref >60)
Glucose, Bld: 77 mg/dL (ref 70–99)
Potassium: 3.8 mmol/L (ref 3.5–5.1)
Sodium: 138 mmol/L (ref 135–145)
Total Bilirubin: 1.8 mg/dL — ABNORMAL HIGH (ref 0.3–1.2)
Total Protein: 6.4 g/dL — ABNORMAL LOW (ref 6.5–8.1)

## 2020-09-18 LAB — MAGNESIUM: Magnesium: 2 mg/dL (ref 1.7–2.4)

## 2020-09-18 LAB — PHOSPHORUS: Phosphorus: 2.7 mg/dL (ref 2.5–4.6)

## 2020-09-18 MED ORDER — LEVETIRACETAM 750 MG PO TABS
1500.0000 mg | ORAL_TABLET | Freq: Every evening | ORAL | Status: DC
  Administered 2020-09-18: 1500 mg via ORAL
  Filled 2020-09-18 (×2): qty 2

## 2020-09-18 MED ORDER — THIAMINE HCL 100 MG/ML IJ SOLN: 100.0000 mg | Freq: Every day | INTRAMUSCULAR | Status: DC

## 2020-09-18 MED ORDER — LEVETIRACETAM 750 MG PO TABS
1500.0000 mg | ORAL_TABLET | Freq: Two times a day (BID) | ORAL | Status: DC
  Filled 2020-09-18: qty 2

## 2020-09-18 MED ORDER — LAMOTRIGINE 100 MG PO TABS
150.0000 mg | ORAL_TABLET | Freq: Two times a day (BID) | ORAL | Status: DC
  Administered 2020-09-18 – 2020-09-19 (×3): 150 mg via ORAL
  Filled 2020-09-18 (×3): qty 2

## 2020-09-18 MED ORDER — ADULT MULTIVITAMIN W/MINERALS CH
1.0000 | ORAL_TABLET | Freq: Every day | ORAL | Status: DC
  Administered 2020-09-19: 10:00:00 1 via ORAL
  Filled 2020-09-18: qty 1

## 2020-09-18 MED ORDER — LACTATED RINGERS IV SOLN: INTRAVENOUS | Status: AC

## 2020-09-18 MED ORDER — THIAMINE HCL 100 MG/ML IJ SOLN
500.0000 mg | Freq: Three times a day (TID) | INTRAVENOUS | Status: DC
  Administered 2020-09-18 – 2020-09-19 (×3): 500 mg via INTRAVENOUS
  Filled 2020-09-18 (×5): qty 5

## 2020-09-18 MED ORDER — ENSURE ENLIVE PO LIQD
237.0000 mL | Freq: Three times a day (TID) | ORAL | Status: DC
  Administered 2020-09-18 – 2020-09-19 (×2): 237 mL via ORAL

## 2020-09-18 MED ORDER — LEVETIRACETAM 500 MG PO TABS
1000.0000 mg | ORAL_TABLET | Freq: Every morning | ORAL | Status: DC
  Administered 2020-09-18 – 2020-09-19 (×2): 1000 mg via ORAL
  Filled 2020-09-18 (×2): qty 2

## 2020-09-18 NOTE — Progress Notes (Addendum)
PROGRESS NOTE    Jeremy Christensen.  WGN:[id number] DOB: [birth year] DOA: 09/16/2020 PCP: Preston Fleeting, MD   Chief Complaint  Patient presents with   Fall   Altered Mental Status   Brief Narrative:  Jeremy Christensen. is Jeremy Christensen 75 y.o. male with medical history significant for moderate to advanceddementia, BPH, history of seizures, presents to the emergency department for chief concerns of change in mental status and patient was found on the floor at group home.   He's been admitted with concern for AMS.   Assessment & Plan:   Principal Problem:   Altered mental status Active Problems:   Essential hypertension   Seizure disorder (HCC)   Dementia (HCC)   Dementia due to Parkinson's disease with behavioral disturbance (HCC)   Essential tremor   AMS (altered mental status)  # Acute Metabolic Encephalopathy - reportedly nonverbal at presentation (Domonick Sittner change from baseline), seems to be improving - he's speaking some to me when prompted - unclear etiology - pt with hx of seizures, ? If this may have been cause.  Ddx includes medications (on ativan, abilify, etc), infection (no obvious source at this time), progression of dementia - MRI brain without acute abnormality - head CT without evidence of acute intracranial abnormality - unremarkable abdominal radiograph - UA not c/w infection - Ammonia wnl, TSH mildly elevated, follow b12 wnl, folate wnl -- high dose thiamine, follow B1 level  - Keppra level pending - CK was 140 - Trazodone has been held due to altered mentation - EEG pending given hx seizures - N.p.o. except for sips with meds and ice chips until cleared by speech - Fall precautions   # History of seizures-no witnessed seizure prior to presentation - follows with neurology outpatient, "focal onset with generalization vs complex partial seizure" - Keppra 1500 mg p.o. twice daily ->mar from facility shows 1000 mg qam and 1500 qpm, will adjust, lamotrigine  150 mg p.o. twice daily -- transitioned keppra to IV while NPO, discussed with neurology informally, substituted vimpat for lamictal while NPO -- transition back to keppra and lamictal PO 7/5 -- follow EEG - would c/s neurology formally if positive or if concerns for seizure noted - Ativan 1 mg IV every 2 hours as needed for seizures - Seizure precaution   # Insomnia-trazodone nightly has been held   # Advanced dementia-donepezil 10 mg nightly, amantadine 100 mg twice daily   # Psychiatric imbalance-Abilify 10 mg nightly    # Sinus bradycardia-at baseline  Patient has guardian, DSS  DVT prophylaxis: lovenox Code Status: full  Family Communication: none at bedside Disposition:   Status is: Observation  The patient will require care spanning > 2 midnights and should be moved to inpatient because: Inpatient level of care appropriate due to severity of illness  Dispo: The patient is from: group home              Anticipated d/c is to: grup home              Patient currently is not medically stable to d/c.   Difficult to place patient No       Consultants:  none  Procedures:  none  Antimicrobials:  Anti-infectives (From admission, onward)    None          Subjective: No new complaints  Objective: Vitals:   09/17/20 1626 09/17/20 2241 09/18/20 0631 09/18/20 0829  BP: 125/72 (!) 107/57 (!) 124/57 (!) 127/57  Pulse: 64 (!) 51 Marland Kitchen)  50 (!) 48  Resp: 16 16 15 16   Temp: 98 F (36.7 C) 98.3 F (36.8 C) 98.2 F (36.8 C) 98.6 F (37 C)  TempSrc: Oral  Oral   SpO2: 98% 96% 97% 97%  Weight:      Height:        Intake/Output Summary (Last 24 hours) at 09/18/2020 0902 Last data filed at 09/17/2020 2150 Gross per 24 hour  Intake 100 ml  Output --  Net 100 ml   Filed Weights   09/17/20 0617  Weight: 95 kg    Examination:  General: No acute distress. Cardiovascular: RRR Lungs: unlabored Abdomen: Soft, nontender, nondistended  Neurological: awake, but  confused - answers some questions appropriately, denies any discomfort. Moves all extremities 4. Cranial nerves II through XII grossly intact. Skin: Warm and dry. No rashes or lesions. Extremities: No clubbing or cyanosis. No edema.   Data Reviewed: I have personally reviewed following labs and imaging studies  CBC: Recent Labs  Lab 09/16/20 1614 09/17/20 0837 09/18/20 0529  WBC 6.9 6.7 6.7  NEUTROABS  --  4.8 4.7  HGB 14.3 14.1 13.6  HCT 43.4 41.6 39.3  MCV 97.7 96.5 97.0  PLT 184 166 188    Basic Metabolic Panel: Recent Labs  Lab 09/16/20 1614 09/17/20 0837 09/18/20 0529  NA 142 138 138  K 3.9 3.4* 3.8  CL 111 106 105  CO2 25 23 28   GLUCOSE 113* 89 77  BUN 20 17 23   CREATININE 0.94 0.94 1.36*  CALCIUM 8.8* 8.3* 8.6*  MG  --   --  2.0  PHOS  --   --  2.7    GFR: Estimated Creatinine Clearance: 54.1 mL/min (Abdulah Iqbal) (by C-G formula based on SCr of 1.36 mg/dL (H)).  Liver Function Tests: Recent Labs  Lab 09/16/20 1614 09/17/20 0837 09/18/20 0529  AST 19 22 18   ALT 15 14 16   ALKPHOS 95 94 99  BILITOT 1.1 1.5* 1.8*  PROT 6.9 6.5 6.4*  ALBUMIN 4.2 3.9 3.7    CBG: Recent Labs  Lab 09/16/20 2042  GLUCAP 88     Recent Results (from the past 240 hour(s))  Resp Panel by RT-PCR (Flu Agnes Brightbill&B, Covid) Nasopharyngeal Swab     Status: None   Collection Time: 09/16/20  8:18 PM   Specimen: Nasopharyngeal Swab; Nasopharyngeal(NP) swabs in vial transport medium  Result Value Ref Range Status   SARS Coronavirus 2 by RT PCR NEGATIVE NEGATIVE Final    Comment: (NOTE) SARS-CoV-2 target nucleic acids are NOT DETECTED.  The SARS-CoV-2 RNA is generally detectable in upper respiratory specimens during the acute phase of infection. The lowest concentration of SARS-CoV-2 viral copies this assay can detect is 138 copies/mL. Taleen Prosser negative result does not preclude SARS-Cov-2 infection and should not be used as the sole basis for treatment or other patient management decisions. Seini Lannom  negative result may occur with  improper specimen collection/handling, submission of specimen other than nasopharyngeal swab, presence of viral mutation(s) within the areas targeted by this assay, and inadequate number of viral copies(<138 copies/mL). Tierre Netto negative result must be combined with clinical observations, patient history, and epidemiological information. The expected result is Negative.  Fact Sheet for Patients:   Fact Sheet for Healthcare Providers:   This test is no t yet approved or cleared by the 11/17/20 FDA and  has been authorized for detection and/or diagnosis of SARS-CoV-2 by FDA under an Emergency Use Authorization (EUA). This EUA will remain  in  effect (meaning this test can be used) for the duration of the COVID-19 declaration under Section 564(b)(1) of the Act, 21 U.S.C.section 360bbb-3(b)(1), unless the authorization is terminated  or revoked sooner.       Influenza Janaisha Tolsma by PCR NEGATIVE NEGATIVE Final   Influenza B by PCR NEGATIVE NEGATIVE Final    Comment: (NOTE) The Xpert Xpress SARS-CoV-2/FLU/RSV plus assay is intended as an aid in the diagnosis of influenza from Nasopharyngeal swab specimens and should not be used as Jahiem Franzoni sole basis for treatment. Nasal washings and aspirates are unacceptable for Xpert Xpress SARS-CoV-2/FLU/RSV testing.  Fact Sheet for Patients: BloggerCourse.com  Fact Sheet for Healthcare Providers: SeriousBroker.it  This test is not yet approved or cleared by the [place] FDA and has been authorized for detection and/or diagnosis of SARS-CoV-2 by FDA under an Emergency Use Authorization (EUA). This EUA will remain in effect (meaning this test can be used) for the duration of the COVID-19 declaration under Section 564(b)(1) of the Act, 21 U.S.C. section 360bbb-3(b)(1), unless the authorization  is terminated or revoked.  Performed at [facility], [address] [place], [place] [address]          Radiology Studies: CT Head Wo Contrast  Result Date: 09/16/2020 CLINICAL DATA:  75 year old male with altered mental status and fall. EXAM: CT HEAD WITHOUT CONTRAST CT CERVICAL SPINE WITHOUT CONTRAST TECHNIQUE: Multidetector CT imaging of the head and cervical spine was performed following the standard protocol without intravenous contrast. Multiplanar CT image reconstructions of the cervical spine were also generated. COMPARISON:  08/04/2020 CT and prior studies FINDINGS: CT HEAD FINDINGS Brain: No evidence of acute infarction, hemorrhage, hydrocephalus, extra-axial collection or mass lesion/mass effect. Atrophy and chronic small-vessel white matter ischemic changes are again noted. Vascular: No hyperdense vessel or unexpected calcification. Skull: Normal. Negative for fracture or focal lesion. Sinuses/Orbits: No acute finding. Other: None. CT CERVICAL SPINE FINDINGS Alignment: Normal. Skull base and vertebrae: No acute fracture. No primary bone lesion or focal pathologic process. Soft tissues and spinal canal: No prevertebral fluid or swelling. No visible canal hematoma. Disc levels: Mild to moderate multilevel degenerative disc disease/spondylosis noted. Upper chest: No acute abnormality Other: None IMPRESSION: 1. No evidence of acute intracranial abnormality. Atrophy and chronic small-vessel white matter ischemic changes. 2. No static evidence of acute injury to the cervical spine. Electronically Signed   By: Harmon Pier M.D.   On: 09/16/2020 17:09   CT Cervical Spine Wo Contrast  Result Date: 09/16/2020 CLINICAL DATA:  75 year old male with altered mental status and fall. EXAM: CT HEAD WITHOUT CONTRAST CT CERVICAL SPINE WITHOUT CONTRAST TECHNIQUE: Multidetector CT imaging of the head and cervical spine was performed following the standard protocol without intravenous contrast.  Multiplanar CT image reconstructions of the cervical spine were also generated. COMPARISON:  08/04/2020 CT and prior studies FINDINGS: CT HEAD FINDINGS Brain: No evidence of acute infarction, hemorrhage, hydrocephalus, extra-axial collection or mass lesion/mass effect. Atrophy and chronic small-vessel white matter ischemic changes are again noted. Vascular: No hyperdense vessel or unexpected calcification. Skull: Normal. Negative for fracture or focal lesion. Sinuses/Orbits: No acute finding. Other: None. CT CERVICAL SPINE FINDINGS Alignment: Normal. Skull base and vertebrae: No acute fracture. No primary bone lesion or focal pathologic process. Soft tissues and spinal canal: No prevertebral fluid or swelling. No visible canal hematoma. Disc levels: Mild to moderate multilevel degenerative disc disease/spondylosis noted. Upper chest: No acute abnormality Other: None IMPRESSION: 1. No evidence of acute intracranial abnormality. Atrophy and chronic small-vessel  white matter ischemic changes. 2. No static evidence of acute injury to the cervical spine. Electronically Signed   By: Harmon PierJeffrey  Hu M.D.   On: 09/16/2020 17:09   MR BRAIN WO CONTRAST  Result Date: 09/16/2020 CLINICAL DATA:  Encephalopathy EXAM: MRI HEAD WITHOUT CONTRAST TECHNIQUE: Multiplanar, multiecho pulse sequences of the brain and surrounding structures were obtained without intravenous contrast. COMPARISON:  None. FINDINGS: Brain: No acute infarct, mass effect or extra-axial collection. No acute or chronic hemorrhage. Hyperintense T2-weighted signal is moderately widespread throughout the white matter. Generalized volume loss without Elvira Langston clear lobar predilection. The midline structures are normal. Vascular: Major flow voids are preserved. Skull and upper cervical spine: Normal calvarium and skull base. Visualized upper cervical spine and soft tissues are normal. Sinuses/Orbits:No paranasal sinus fluid levels or advanced mucosal thickening. No mastoid or  middle ear effusion. Normal orbits. IMPRESSION: 1. No acute intracranial abnormality. 2. Generalized volume loss and findings of chronic microvascular ischemia. Electronically Signed   By: Deatra RobinsonKevin  Herman M.D.   On: 09/16/2020 22:48   DG Chest Port 1 View  Result Date: 09/16/2020 CLINICAL DATA:  Found down EXAM: PORTABLE CHEST 1 VIEW COMPARISON:  08/04/2020 FINDINGS: Lungs are clear.  No pleural effusion or pneumothorax. The heart is normal in size. IMPRESSION: No evidence of acute cardiopulmonary disease. Electronically Signed   By: Charline BillsSriyesh  Krishnan M.D.   On: 09/16/2020 21:45   DG Abd Portable 1V  Result Date: 09/16/2020 CLINICAL DATA:  Found down EXAM: PORTABLE ABDOMEN - 1 VIEW COMPARISON:  01/19/2017 FINDINGS: Nonobstructive bowel gas pattern. Mild degenerative changes of the lumbar spine. Visualized bony pelvis appears intact. IMPRESSION: Unremarkable abdominal radiograph. Electronically Signed   By: Charline BillsSriyesh  Krishnan M.D.   On: 09/16/2020 21:45        Scheduled Meds:  amantadine  100 mg Oral BID   ARIPiprazole  10 mg Oral QHS   donepezil  10 mg Oral QHS   enoxaparin (LOVENOX) injection  0.5 mg/kg Subcutaneous Q24H   nystatin   Topical TID   tamsulosin  0.4 mg Oral QHS   Continuous Infusions:  lacosamide (VIMPAT) IV     levETIRAcetam     levETIRAcetam Stopped (09/17/20 2150)     LOS: 1 day    Time spent: over 30 min    Lacretia Nicksaldwell Powell, MD [facility]   To contact the attending provider between 7A-7P or the covering provider during after hours 7P-7A, please log into the web site [email or web address] and access using universal [place] password for that web site. If you do not have the password, please call the hospital operator.  09/18/2020, 9:02 AM

## 2020-09-18 NOTE — Progress Notes (Signed)
Initial Nutrition Assessment  DOCUMENTATION CODES:   Obesity unspecified  INTERVENTION:   Ensure Enlive po TID, each supplement provides 350 kcal and 20 grams of protein  MVI po daily  NUTRITION DIAGNOSIS:   Inadequate oral intake related to acute illness as evidenced by meal completion < 25%  GOAL:   Patient will meet greater than or equal to 90% of their needs  MONITOR:   PO intake, Supplement acceptance, Labs, Weight trends, Skin, I & O's  REASON FOR ASSESSMENT:   Malnutrition Screening Tool    ASSESSMENT:   75 y.o. male with medical history significant for Parkinson's, moderate to advanced dementia, lives in a group home, BPH, HTN and seizures who presents to the emergency department for chief concerns of change in mental status and patient was found on the floor at group home.  RD working remotely.  Unable to speak with pt today via phone r/t AMS and dementia. Pt with poor appetite and oral intake; pt did not eat any breakfast today. RD suspects good oral intake at baseline. RD will add supplements and MVI to help pt meet his estimated needs. Per chart, pt appears weight stable at baseline. RD will obtain NFPE at follow up.   Medications reviewed and include: lovenox, thiamine, LRS @125ml /hr  Labs reviewed: K 3.8 wnl, P 2.7 wnl, Mg 2.0 wnl  NUTRITION - FOCUSED PHYSICAL EXAM: Unable to perform at this time   Diet Order:   Diet Order             Diet regular Room service appropriate? Yes; Fluid consistency: Thin  Diet effective now                  EDUCATION NEEDS:   No education needs have been identified at this time  Skin:  Skin Assessment: Reviewed RN Assessment (ecchymosis)  Last BM:  pta  Height:   Ht Readings from Last 1 Encounters:  09/17/20 5\' 8"  (1.727 m)    Weight:   Wt Readings from Last 1 Encounters:  09/17/20 95 kg    Ideal Body Weight:  70 kg  BMI:  Body mass index is 31.84 kg/m.  Estimated Nutritional Needs:   Kcal:   1900-2200kcal/day  Protein:  95-110g/day  Fluid:  1.9-2.2L/day  MS, RD, LDN Please refer to [facility] for RD and/or RD on-call/weekend/after hours pager

## 2020-09-18 NOTE — Plan of Care (Signed)
End of Shift Summary:  Responds to voice, clear speech, but delayed responses. Oriented to self. VSS on room air. Denies pain or n/v. Frequent attempts to get out of bed, pulled out 2 pivs, sitter order placed. Prn meds for agitation given. IV keppra per MAR. Remained free from falls or injury. Sitter at bedside.   Problem: Clinical Measurements: Goal: Ability to maintain clinical measurements within normal limits will improve Outcome: Progressing Goal: Will remain free from infection Outcome: Progressing Goal: Diagnostic test results will improve Outcome: Progressing Goal: Respiratory complications will improve Outcome: Progressing Goal: Cardiovascular complication will be avoided Outcome: Progressing   Problem: Health Behavior/Discharge Planning: Goal: Ability to manage health-related needs will improve Outcome: Progressing   Problem: Nutrition: Goal: Adequate nutrition will be maintained Outcome: Progressing   Problem: Elimination: Goal: Will not experience complications related to bowel motility Outcome: Progressing Goal: Will not experience complications related to urinary retention Outcome: Progressing   Problem: Safety: Goal: Ability to remain free from injury will improve Outcome: Progressing   Problem: Skin Integrity: Goal: Risk for impaired skin integrity will decrease Outcome: Progressing

## 2020-09-18 NOTE — Evaluation (Signed)
Clinical/Bedside Swallow Evaluation Patient Details  Name: Jeremy Christensen. MRN: [id number] Date of Birth: 1945-12-08  Today's Date: 09/18/2020 Time: SLP Start Time (ACUTE ONLY): 0820 SLP Stop Time (ACUTE ONLY): 0845 SLP Time Calculation (min) (ACUTE ONLY): 25 min  Past Medical History:  Past Medical History:  Diagnosis Date   Alzheimer disease (HCC)    Dementia (HCC)    Hypertension    Seizures (HCC)    Past Surgical History: History reviewed. No pertinent surgical history. HPI:  Jeremy Sherburn. is a 75 y.o. male with medical history significant for moderate to advanceddementia, BPH, history of seizures, presents to the emergency department for chief concerns of change in mental status and patient was found on the floor at group home. MRI brain without acute abnormality, head CT without evidence of acute intracranial abnormality.   Assessment / Plan / Recommendation Clinical Impression  Pt demonstrated adequate oropharyngeal abilities when consuming trials of graham cracker, applesauce and thin liquids via straw. Pt required assistance with self-feeding but was able to bring items to his lips once they were placed in his hand. Pt's oral phase was unremarkable with active mastication of graham cracker and complete oral clearing. When consuming multiple consecutive sips of thin liquids via straw, pt's swallow response appeared swift and was free of overt s/s of aspiration. At this time, recommend pt consume regular diet with thin liquids via straw and medicine whole with thin liquids. ST intervention doesn't appear indicated at this time. SLP Visit Diagnosis: Dysphagia, unspecified (R13.10)    Aspiration Risk  Mild aspiration risk    Diet Recommendation Regular;Thin liquid   Liquid Administration via: Straw Medication Administration: Whole meds with liquid Supervision: Staff to assist with self feeding;Full supervision/cueing for compensatory strategies Compensations:  Minimize environmental distractions;Slow rate;Small sips/bites Postural Changes: Seated upright at 90 degrees;Remain upright for at least 30 minutes after po intake    Other  Recommendations Oral Care Recommendations: Oral care BID   Follow up Recommendations None      Frequency and Duration     N/A       Prognosis   N/A     Swallow Study   General Date of Onset: 09/17/20 HPI: Jeremy Stanislaw. is a 75 y.o. male with medical history significant for moderate to advanceddementia, BPH, history of seizures, presents to the emergency department for chief concerns of change in mental status and patient was found on the floor at group home. MRI brain without acute abnormality, head CT without evidence of acute intracranial abnormality. Type of Study: Bedside Swallow Evaluation Previous Swallow Assessment: none in chart Diet Prior to this Study: NPO Temperature Spikes Noted: No Respiratory Status: Room air History of Recent Intubation: No Behavior/Cognition: Cooperative;Requires cueing Oral Cavity Assessment: Within Functional Limits Oral Care Completed by SLP: Recent completion by staff Oral Cavity - Dentition: Edentulous;Poor condition;Missing dentition Self-Feeding Abilities: Needs assist;Needs set up Patient Positioning: Upright in bed Baseline Vocal Quality: Normal Volitional Cough: Cognitively unable to elicit Volitional Swallow: Unable to elicit    Oral/Motor/Sensory Function Overall Oral Motor/Sensory Function: Within functional limits   Ice Chips Ice chips: Not tested   Thin Liquid Thin Liquid: Within functional limits Presentation: Straw Other Comments: able to hold cup and bring to his lips once cup was placed in pt's hand    Nectar Thick Nectar Thick Liquid: Not tested   Honey Thick Honey Thick Liquid: Not tested   Puree Puree: Within functional limits Presentation: Spoon   Solid  Solid: Within functional limits Presentation: Self Fed (once items were  placed in his hand)     Jeremy Christensen B. Dreama Saa M.S., CCC-SLP, [facility] Speech-Language Pathologist Rehabilitation Services Office [phone]  Jeremy Christensen Dreama Saa 09/18/2020,3:34 PM

## 2020-09-18 NOTE — Evaluation (Signed)
Physical Therapy Evaluation Patient Details Name: Jeremy Christensen. MRN: [id number] DOB: 12-22-1945 Today's Date: 09/18/2020   History of Present Illness  presented to ER secondary to AMS, fall in home environment; admitted for management of acute metabolic encephalopathy.  Clinical Impression  Patient sleeping upon arrival to room; sitter at bedside.  Easily awakens to voice, light touch. Oriented to self only and follows approx 25% simple, verbal commands/questions (optimized with yes/no questions/instructions).  Often requires hand-over-hand to initiate functional movement tasks; generally stiff/rigid throughout trunk.  Currently requiring mod/max assist for bed mobility; min assist +2 for sit/stand, basic transfer and gait (150') with RW. Demonstrates narrowed BOS with limited heel strike/toe off and overall foot clearance; L lateral lean at times (resulting in L foot adduction throughout gait cycle), min assist to maintain balance/safety Do recommend use of RW and +1 for all mobility at this time. Would benefit from skilled PT to address above deficits and promote optimal return to PLOF.; Recommend transition to HHPT upon discharge from acute hospitalization.  Feel HHPT would allow transition back to familiar living environment and routine to optimize carry-over and integration of learning.  HOWEVER, if group home unable to provide +1 assist at all times, may necessitate transition to STR.  Will continue to follow as appropriate.     Follow Up Recommendations Home health PT    Equipment Recommendations  Rolling walker with 5" wheels    Recommendations for Other Services       Precautions / Restrictions Precautions Precautions: Fall Restrictions Weight Bearing Restrictions: No      Mobility  Bed Mobility Overal bed mobility: Needs Assistance Bed Mobility: Supine to Sit     Supine to sit: Mod assist;Max assist     General bed mobility comments: generally rigid throughout  trunk; limited dissociation of trunk/extremities    Transfers Overall transfer level: Needs assistance Equipment used: Rolling walker (2 wheeled) Transfers: Sit to/from Stand Sit to Stand: Min assist;+2 safety/equipment         General transfer comment: hand-over-hand for hand position and task initiation  Ambulation/Gait Ambulation/Gait assistance: Min assist;+2 safety/equipment Gait Distance (Feet): 150 Feet Assistive device: Rolling walker (2 wheeled)       General Gait Details: narrowed BOS with limited heel strike/toe off and overall foot clearance; L lateral lean at times (resulting in L foot adduction throughout gait cycle), min assist to maintain balance/safety  Stairs            Wheelchair Mobility    Modified Rankin (Stroke Patients Only)       Balance Overall balance assessment: Needs assistance Sitting-balance support: Feet supported;No upper extremity supported Sitting balance-Leahy Scale: Good     Standing balance support: Bilateral upper extremity supported Standing balance-Leahy Scale: Fair                               Pertinent Vitals/Pain Pain Assessment: Faces Faces Pain Scale: No hurt    Home Living Family/patient expects to be discharged to:: Group home                 Additional Comments: Patient poor historian; unable to provide social history    Prior Function           Comments: Patient poor historian; unable to provide social history     Hand Dominance        Extremity/Trunk Assessment   Upper Extremity Assessment Upper Extremity Assessment: Overall [facility]  for tasks assessed    Lower Extremity Assessment Lower Extremity Assessment: Overall WFL for tasks assessed (grossly at least 4- to 4/5 throughout)       Communication   Communication:  (very short, 1-2 word responses, optimal with yes/no questions; speaks only when spoken to)  Cognition Arousal/Alertness: Awake/alert Behavior During  Therapy: Flat affect Overall Cognitive Status: No family/caregiver present to determine baseline cognitive functioning                                 General Comments: oriented to self only; follows approx 25% one-step commands, often requiring hand-over-hand to initiate all functional activities      General Comments      Exercises     Assessment/Plan    PT Assessment Patient needs continued PT services  PT Problem List Decreased activity tolerance;Decreased balance;Decreased mobility;Decreased coordination;Decreased cognition;Decreased knowledge of use of DME;Decreased safety awareness;Decreased knowledge of precautions       PT Treatment Interventions DME instruction;Gait training;Functional mobility training;Therapeutic activities;Therapeutic exercise;Balance training;Stair training;Patient/family education;Cognitive remediation    PT Goals (Current goals can be found in the Care Plan section)  Acute Rehab PT Goals Patient Stated Goal: unable to verbalize PT Goal Formulation: Patient unable to participate in goal setting Time For Goal Achievement: 10/02/20 Potential to Achieve Goals: Fair    Frequency Min 2X/week   Barriers to discharge        Co-evaluation               AM-PAC PT "6 Clicks" Mobility  Outcome Measure Help needed turning from your back to your side while in a flat bed without using bedrails?: A Little Help needed moving from lying on your back to sitting on the side of a flat bed without using bedrails?: A Lot Help needed moving to and from a bed to a chair (including a wheelchair)?: A Little Help needed standing up from a chair using your arms (e.g., wheelchair or bedside chair)?: A Little Help needed to walk in hospital room?: A Little Help needed climbing 3-5 steps with a railing? : A Lot 6 Click Score: 16    End of Session Equipment Utilized During Treatment: Gait belt Activity Tolerance: Patient tolerated treatment  well Patient left: in chair;with call bell/phone within reach;with nursing/sitter in room Nurse Communication: Mobility status PT Visit Diagnosis: Muscle weakness (generalized) (M62.81);Difficulty in walking, not elsewhere classified (R26.2)    Time: 1308-6578 PT Time Calculation (min) (ACUTE ONLY): 33 min   Charges:   PT Evaluation $PT Eval Moderate Complexity: 1 Mod PT Treatments $Therapeutic Activity: 8-22 mins       Margie Urbanowicz H. Manson Passey, PT, DPT, NCS 09/18/20, 5:51 PM [phone]

## 2020-09-19 DIAGNOSIS — G20 Parkinson's disease: Secondary | ICD-10-CM

## 2020-09-19 DIAGNOSIS — R4182 Altered mental status, unspecified: Secondary | ICD-10-CM

## 2020-09-19 DIAGNOSIS — G40909 Epilepsy, unspecified, not intractable, without status epilepticus: Secondary | ICD-10-CM

## 2020-09-19 DIAGNOSIS — F0281 Dementia in other diseases classified elsewhere with behavioral disturbance: Secondary | ICD-10-CM

## 2020-09-19 DIAGNOSIS — I10 Essential (primary) hypertension: Secondary | ICD-10-CM

## 2020-09-19 LAB — CBC WITH DIFFERENTIAL/PLATELET
Abs Immature Granulocytes: 0.02 10*3/uL (ref 0.00–0.07)
Basophils Absolute: 0 10*3/uL (ref 0.0–0.1)
Basophils Relative: 1 %
Eosinophils Absolute: 0.1 10*3/uL (ref 0.0–0.5)
Eosinophils Relative: 2 %
HCT: 39.9 % (ref 39.0–52.0)
Hemoglobin: 13.4 g/dL (ref 13.0–17.0)
Immature Granulocytes: 0 %
Lymphocytes Relative: 18 %
Lymphs Abs: 1.1 10*3/uL (ref 0.7–4.0)
MCH: 32.6 pg (ref 26.0–34.0)
MCHC: 33.6 g/dL (ref 30.0–36.0)
MCV: 97.1 fL (ref 80.0–100.0)
Monocytes Absolute: 0.5 10*3/uL (ref 0.1–1.0)
Monocytes Relative: 9 %
Neutro Abs: 4.1 10*3/uL (ref 1.7–7.7)
Neutrophils Relative %: 70 %
Platelets: 175 10*3/uL (ref 150–400)
RBC: 4.11 MIL/uL — ABNORMAL LOW (ref 4.22–5.81)
RDW: 12.2 % (ref 11.5–15.5)
WBC: 5.8 10*3/uL (ref 4.0–10.5)
nRBC: 0 % (ref 0.0–0.2)

## 2020-09-19 LAB — COMPREHENSIVE METABOLIC PANEL
ALT: 12 U/L (ref 0–44)
AST: 17 U/L (ref 15–41)
Albumin: 3.5 g/dL (ref 3.5–5.0)
Alkaline Phosphatase: 89 U/L (ref 38–126)
Anion gap: 5 (ref 5–15)
BUN: 21 mg/dL (ref 8–23)
CO2: 28 mmol/L (ref 22–32)
Calcium: 8.6 mg/dL — ABNORMAL LOW (ref 8.9–10.3)
Chloride: 105 mmol/L (ref 98–111)
Creatinine, Ser: 1.14 mg/dL (ref 0.61–1.24)
GFR, Estimated: >60 mL/min (ref >60)
Glucose, Bld: 88 mg/dL (ref 70–99)
Potassium: 4.1 mmol/L (ref 3.5–5.1)
Sodium: 138 mmol/L (ref 135–145)
Total Bilirubin: 1.2 mg/dL (ref 0.3–1.2)
Total Protein: 6.1 g/dL — ABNORMAL LOW (ref 6.5–8.1)

## 2020-09-19 LAB — PHOSPHORUS: Phosphorus: 2.5 mg/dL (ref 2.5–4.6)

## 2020-09-19 LAB — MAGNESIUM: Magnesium: 2 mg/dL (ref 1.7–2.4)

## 2020-09-19 NOTE — NC FL2 (Signed)
[place] MEDICAID FL2 LEVEL OF CARE SCREENING TOOL     IDENTIFICATION  Patient Name: Jeremy Christensen. Birthdate: 1945-04-14 Sex: male Admission Date (Current Location): 09/16/2020  [facility] and [place] Number:  Chiropodist and Address:  [facility], [address] [place], [place] [address]      Provider Number: [id number]  Attending Physician Name and Address:  Alberteen Sam, *  Relative Name and Phone Number:  [place] DSS    Current Level of Care: Hospital Recommended Level of Care: Family Care Home Prior Approval Number:    Date Approved/Denied:   PASRR Number:    Discharge Plan: Other (Comment) (Group home)    Current Diagnoses: Patient Active Problem List   Diagnosis Date Noted   AMS (altered mental status) 09/17/2020   Dementia (HCC) 09/16/2020   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/16/2020   Essential tremor 09/16/2020   Seizure disorder (HCC) 10/09/2019   Dementia with behavioral disturbance (HCC) 10/09/2019   Elevated troponin 10/08/2019   Essential hypertension 10/08/2019   Acute confusion 01/20/2017   Altered mental status 01/18/2017   Pressure injury of skin 01/18/2017    Orientation RESPIRATION BLADDER Height & Weight     Self  Normal Continent Weight: 95 kg Height:  5\' 8"  (172.7 cm)  BEHAVIORAL SYMPTOMS/MOOD NEUROLOGICAL BOWEL NUTRITION STATUS      Continent Diet (see discharge summary)  AMBULATORY STATUS COMMUNICATION OF NEEDS Skin   Limited Assist Verbally Normal                       Personal Care Assistance Level of Assistance  Bathing, Feeding, Dressing Bathing Assistance: Limited assistance Feeding assistance: Limited assistance Dressing Assistance: Limited assistance     Functional Limitations Info             SPECIAL CARE FACTORS FREQUENCY  PT (By licensed PT), OT (By licensed OT)     PT Frequency: home health through OT  Frequency: home health through [facility]            Contractures Contractures Info: Not present    Additional Factors Info  Code Status, Allergies Code Status Info: Full Allergies Info: Tetracyclines and related           Current Medications (09/19/2020):  This is the current hospital active medication list Current Facility-Administered Medications  Medication Dose Route Frequency Provider Last Rate Last Admin   acetaminophen (TYLENOL) tablet 650 mg  650 mg Oral Q6H PRN Cox, Amy N, DO       Or   acetaminophen (TYLENOL) suppository 650 mg  650 mg Rectal Q6H PRN Cox, Amy N, DO       amantadine (SYMMETREL) capsule 100 mg  100 mg Oral BID Cox, Amy N, DO   100 mg at 09/19/20 1015   ARIPiprazole (ABILIFY) tablet 10 mg  10 mg Oral QHS Cox, Amy N, DO   10 mg at 09/18/20 2228   donepezil (ARICEPT) tablet 10 mg  10 mg Oral QHS Cox, Amy N, DO   10 mg at 09/18/20 2226   enoxaparin (LOVENOX) injection 47.5 mg  0.5 mg/kg Subcutaneous Q24H 2227, RPH   47.5 mg at 09/19/20 1015   feeding supplement (ENSURE ENLIVE / ENSURE PLUS) liquid 237 mL  237 mL Oral TID BM 11/20/20., MD   237 mL at 09/19/20 1015   haloperidol lactate (HALDOL) injection 1 mg  1 mg  Intramuscular Q6H PRN Mansy, Jan A, MD   1 mg at 09/17/20 2006   lamoTRIgine (LAMICTAL) tablet 150 mg  150 mg Oral BID Zigmund Daniel., MD   150 mg at 09/19/20 1014   levETIRAcetam (KEPPRA) tablet 1,000 mg  1,000 mg Oral q morning Zigmund Daniel., MD   1,000 mg at 09/19/20 1014   And   levETIRAcetam (KEPPRA) tablet 1,500 mg  1,500 mg Oral QPM Zigmund Daniel., MD   1,500 mg at 09/18/20 1711   LORazepam (ATIVAN) injection 0.5 mg  0.5 mg Intravenous Q4H PRN Mansy, Jan A, MD   0.5 mg at 09/18/20 0145   LORazepam (ATIVAN) injection 1 mg  1 mg Intravenous Q2H PRN Cox, Amy N, DO       multivitamin with minerals tablet 1 tablet  1 tablet Oral Daily Zigmund Daniel., MD   1 tablet at 09/19/20 1014   nystatin  (MYCOSTATIN/NYSTOP) topical powder   Topical TID Zigmund Daniel., MD   Given at 09/19/20 1016   ondansetron (ZOFRAN) tablet 4 mg  4 mg Oral Q6H PRN Cox, Amy N, DO       Or   ondansetron (ZOFRAN) injection 4 mg  4 mg Intravenous Q6H PRN Cox, Amy N, DO       tamsulosin (FLOMAX) capsule 0.4 mg  0.4 mg Oral QHS Cox, Amy N, DO   0.4 mg at 09/18/20 2227   thiamine 500mg  in normal saline (9ml) IVPB  500 mg Intravenous Q8H 45m., MD 100 mL/hr at 09/19/20 0459 500 mg at 09/19/20 0459   Followed by   11/20/20 ON 09/21/2020] thiamine (B-1) injection 100 mg  100 mg Intravenous Daily 11/22/2020., MD         Discharge Medications: Medication List       TAKE these medications     acetaminophen 325 MG tablet Commonly known as: TYLENOL Take 650 mg by mouth every 6 (six) hours as needed for mild pain or fever.    aluminum-magnesium hydroxide-simethicone 200-200-20 MG/5ML Susp Commonly known as: MAALOX Take 15-30 mLs by mouth as needed (indigestion). Take per standing as needed orders for indigestion/heartburn    Amantadine HCl 100 MG tablet Take 50 mg by mouth 2 (two) times daily.    amLODipine 2.5 MG tablet Commonly known as: NORVASC Take 2.5 mg by mouth daily.    ARIPiprazole 20 MG tablet Commonly known as: ABILIFY Take 20 mg by mouth at bedtime.    bismuth subsalicylate 262 MG/15ML suspension Commonly known as: PEPTO BISMOL Take 30 mLs by mouth as needed. Take per standing as needed orders for nausea    cyanocobalamin 2000 MCG tablet Take 2,000 mcg by mouth daily.    donepezil 10 MG tablet Commonly known as: ARICEPT Take 10 mg by mouth at bedtime.    guaiFENesin 100 MG/5ML liquid Commonly known as: ROBITUSSIN Take 200 mg by mouth as needed. Take per standing as needed orders for cough    lamoTRIgine 150 MG tablet Commonly known as: LAMICTAL Take 150 mg by mouth 2 (two) times daily.    levETIRAcetam 500 MG tablet Commonly known as: KEPPRA Take  1,000-1,500 mg by mouth 2 (two) times daily. Take 2 tablets (1000mg ) in the morning and 3 tablets (1500mg ) nightly    lisinopril 10 MG tablet Commonly known as: ZESTRIL Take 10 mg by mouth daily.    loperamide 2 MG capsule Commonly known as: IMODIUM Take 2  mg by mouth as needed for diarrhea or loose stools.    LORazepam 1 MG tablet Commonly known as: ATIVAN Take 1 mg by mouth 2 (two) times daily as needed for anxiety.    magnesium hydroxide 400 MG/5ML suspension Commonly known as: MILK OF MAGNESIA Take 15-30 mLs by mouth as needed for mild constipation or moderate constipation. Take per standing as needed orders for constipation    neomycin-bacitracin-polymyxin [phone] ointment Apply 1 application topically as needed. For minor abrasions    tamsulosin 0.4 MG Caps capsule Commonly known as: FLOMAX Take 0.4 mg by mouth at bedtime.    traZODone 50 MG tablet Commonly known as: DESYREL Take 50 mg by mouth at bedtime.    Vitamin D3 25 MCG (1000 UT) Caps Take 1 capsule by mouth daily.      Relevant Imaging Results:  Relevant Lab Results:   Additional Information    Allayne Butcher, RN

## 2020-09-19 NOTE — TOC Initial Note (Signed)
Transition of Care [facility]) - Initial/Assessment Note    Patient Details  Name: Jeremy Christensen. MRN: [id number] Date of Birth: 04-23-1945  Transition of Care [facility]) CM/SW Contact:    Allayne Butcher, RN Phone Number: 09/19/2020, 10:59 AM  Clinical Narrative:                 Patient admitted to the hospital with altered mental status and after a fall at the group home he lives at.  Group home is [facility], owner [employer].  Doristine Mango reports that patient normally is able to walk around without assistance.  Doristine Mango reports that he will take the patient back but has been working with DSS on finding alternative placement for the patient more like SNF level of care.  PT is recommending home health, Doristine Mango says to use [facility] or [facility].  Becky Sax with [facility] accepted referral for PT and OT.  Patient needs a walker and [facility] will deliver to the room.  Doristine Mango will be coming to pick up the patient around 02-1229.  RNCM has attempted to contact [place] DSS about discharge, message left for Sharlyne Cai- Marlyne Beards and email sent.    Expected Discharge Plan: Group Home Barriers to Discharge: Barriers Resolved   Patient Goals and CMS Choice Patient states their goals for this hospitalization and ongoing recovery are:: patient unable to state goals- group home will accept him back CMS Medicare.gov Compare Post Acute Care list provided to:: Legal Guardian Choice offered to / list presented to : [facility] POA / Guardian  Expected Discharge Plan and Services Expected Discharge Plan: Group Home   Discharge Planning Services: CM Consult Post Acute Care Choice: Home Health Living arrangements for the past 2 months: Group Home Expected Discharge Date: 09/19/20               DME Arranged: Dan Humphreys rolling DME Agency: [facility] Date DME Agency Contacted: 09/19/20 Time DME Agency Contacted: 1055 Representative spoke with at DME Agency: Bjorn Loser HH Arranged: PT, OT HH Agency: [employer] [facility] Date [facility] Agency Contacted: 09/19/20 Time HH Agency Contacted: 1058 Representative spoke with at [facility] Agency: Elnita Maxwell  Prior Living Arrangements/Services Living arrangements for the past 2 months: Group Home Lives with:: Facility Resident Patient language and need for interpreter reviewed:: Yes Do you feel safe going back to the place where you live?: Yes      Need for Family Participation in Patient Care: Yes (Comment) (dementia) Care giver support system in place?: Yes (comment) (group home and DSS)   Criminal Activity/Legal Involvement Pertinent to Current Situation/Hospitalization: No - Comment as needed  Activities of Daily Living Home Assistive Devices/Equipment: Environmental consultant (specify type), Wheelchair ADL Screening (condition at time of admission) Patient's cognitive ability adequate to safely complete daily activities?: No Is the patient deaf or have difficulty hearing?: No Does the patient have difficulty seeing, even when wearing glasses/contacts?: No Does the patient have difficulty concentrating, remembering, or making decisions?: Yes Patient able to express need for assistance with ADLs?: No Does the patient have difficulty dressing or bathing?: Yes Independently performs ADLs?: No Does the patient have difficulty walking or climbing stairs?: Yes Weakness of Legs: Both Weakness of Arms/Hands: None  Permission Sought/Granted Permission sought to share information with : Case Manager, Magazine features editor, Guardian Permission granted to share information with : Yes, Verbal Permission Granted  Share Information with NAME: Doristine Mango  Permission granted to share info w AGENCY: Group home and DSS  Permission granted to share info  w Relationship: Group home owner     Emotional Assessment Appearance:: Appears stated age     Orientation: : Oriented to Self Alcohol / Substance Use: Not Applicable Psych Involvement: No (comment)  Admission diagnosis:   Altered mental status [R41.82] AMS (altered mental status) [R41.82] Patient Active Problem List   Diagnosis Date Noted   AMS (altered mental status) 09/17/2020   Dementia (HCC) 09/16/2020   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/16/2020   Essential tremor 09/16/2020   Seizure disorder (HCC) 10/09/2019   Dementia with behavioral disturbance (HCC) 10/09/2019   Elevated troponin 10/08/2019   Essential hypertension 10/08/2019   Acute confusion 01/20/2017   Altered mental status 01/18/2017   Pressure injury of skin 01/18/2017   PCP:  Preston Fleeting, MD Pharmacy:   [facility] [place], [place] - [address] [place] [place] [address] Phone: [phone] Fax: [phone]     Social Determinants of Health (SDOH) Interventions    Readmission Risk Interventions No flowsheet data found.

## 2020-09-19 NOTE — Discharge Summary (Signed)
Physician Discharge Summary  Jeremy NickelsWilliam Henry Fasching Jr. ZOX:[id number] DOB: 10-21-1945 DOA: 09/16/2020  PCP: Preston Fleetingevelo, Adrian Mancheno, MD  Admit date: 09/16/2020 Discharge date: 09/19/2020  Admitted From: Group home  Disposition:  Group home   Recommendations for Outpatient Follow-up:  Follow up with PCP Dr. Ephraim HamburgerMancheno in 1 week Dr. Ephraim HamburgerMancheno, please follow up pending B1 level and Keppra level      Home Health: None  Equipment/Devices: None new  Discharge Condition: Good  CODE STATUS: FULL Diet recommendation: Regular  Brief/Interim Summary: Mr. Katrinka BlazingSmith is a 75 y.o. M with cognitive impairment, lives in group home, BPH, hx seizures who presented after being found down and having a few days reduced activity.  Reportedly the patient is less verbal over the last few days, and then found by his staff at his group home on the floor.  In the ER, afebrile, heart rate and respirations normal, blood pressure normal, electrolytes and blood sugar normal, lipase lites are normal, hemoglobin normal.  CT head and C-spine unremarkable.  Admitted for reduced mentation.      PRINCIPAL HOSPITAL DIAGNOSIS: Altered mental status    Discharge Diagnoses:  Acute metabolic encephalopathy likely due to dehydration Patient was admitted, CK, TSH, ammonia, B12, folate were normal.  No signs of infection.  An EEG was obtained that was unremarkable.  The patient appeared to be back to his baseline, and so he was discharged back to his group home.    Epilsepsy Patient was briefly increased to Keppra 1500 twice daily, resumed his previous dose given normal EEG, no seizures in the hospital, continued on Lamictal.  Dementia/cognitive impairment Continue donepezil and amantadine  Mood disorder Continue Abilify  Hypokalemia Supplemented and resolved    Discharge Instructions  Discharge Instructions     Increase activity slowly   Complete by: As directed       Allergies as of 09/19/2020        Reactions   Tetracyclines & Related Hives        Medication List     TAKE these medications    acetaminophen 325 MG tablet Commonly known as: TYLENOL Take 650 mg by mouth every 6 (six) hours as needed for mild pain or fever.   aluminum-magnesium hydroxide-simethicone 200-200-20 MG/5ML Susp Commonly known as: MAALOX Take 15-30 mLs by mouth as needed (indigestion). Take per standing as needed orders for indigestion/heartburn   Amantadine HCl 100 MG tablet Take 50 mg by mouth 2 (two) times daily.   amLODipine 2.5 MG tablet Commonly known as: NORVASC Take 2.5 mg by mouth daily.   ARIPiprazole 20 MG tablet Commonly known as: ABILIFY Take 20 mg by mouth at bedtime.   bismuth subsalicylate 262 MG/15ML suspension Commonly known as: PEPTO BISMOL Take 30 mLs by mouth as needed. Take per standing as needed orders for nausea   cyanocobalamin 2000 MCG tablet Take 2,000 mcg by mouth daily.   donepezil 10 MG tablet Commonly known as: ARICEPT Take 10 mg by mouth at bedtime.   guaiFENesin 100 MG/5ML liquid Commonly known as: ROBITUSSIN Take 200 mg by mouth as needed. Take per standing as needed orders for cough   lamoTRIgine 150 MG tablet Commonly known as: LAMICTAL Take 150 mg by mouth 2 (two) times daily.   levETIRAcetam 500 MG tablet Commonly known as: KEPPRA Take 1,000-1,500 mg by mouth 2 (two) times daily. Take 2 tablets (1000mg ) in the morning and 3 tablets (1500mg ) nightly   lisinopril 10 MG tablet Commonly known as: ZESTRIL Take 10 mg by  mouth daily.   loperamide 2 MG capsule Commonly known as: IMODIUM Take 2 mg by mouth as needed for diarrhea or loose stools.   LORazepam 1 MG tablet Commonly known as: ATIVAN Take 1 mg by mouth 2 (two) times daily as needed for anxiety.   magnesium hydroxide 400 MG/5ML suspension Commonly known as: MILK OF MAGNESIA Take 15-30 mLs by mouth as needed for mild constipation or moderate constipation. Take per standing as needed  orders for constipation   neomycin-bacitracin-polymyxin [phone] ointment Apply 1 application topically as needed. For minor abrasions   tamsulosin 0.4 MG Caps capsule Commonly known as: FLOMAX Take 0.4 mg by mouth at bedtime.   traZODone 50 MG tablet Commonly known as: DESYREL Take 50 mg by mouth at bedtime.   Vitamin D3 25 MCG (1000 UT) Caps Take 1 capsule by mouth daily.       ASK your doctor about these medications    Vitamin D (Ergocalciferol) 1.25 MG (50000 UNIT) Caps capsule Commonly known as: DRISDOL Take 50,000 Units by mouth every 7 (seven) days.               Durable Medical Equipment  (From admission, onward)           Start     Ordered   09/19/20 1053  For home use only DME Walker rolling  Once       Question Answer Comment  Walker: With 5 Inch Wheels   Patient needs a walker to treat with the following condition Weakness generalized      09/19/20 1053            Allergies  Allergen Reactions   Tetracyclines & Related Hives      Procedures/Studies: CT Head Wo Contrast  Result Date: 09/16/2020 CLINICAL DATA:  75 year old male with altered mental status and fall. EXAM: CT HEAD WITHOUT CONTRAST CT CERVICAL SPINE WITHOUT CONTRAST TECHNIQUE: Multidetector CT imaging of the head and cervical spine was performed following the standard protocol without intravenous contrast. Multiplanar CT image reconstructions of the cervical spine were also generated. COMPARISON:  08/04/2020 CT and prior studies FINDINGS: CT HEAD FINDINGS Brain: No evidence of acute infarction, hemorrhage, hydrocephalus, extra-axial collection or mass lesion/mass effect. Atrophy and chronic small-vessel white matter ischemic changes are again noted. Vascular: No hyperdense vessel or unexpected calcification. Skull: Normal. Negative for fracture or focal lesion. Sinuses/Orbits: No acute finding. Other: None. CT CERVICAL SPINE FINDINGS Alignment: Normal. Skull base and vertebrae:  No acute fracture. No primary bone lesion or focal pathologic process. Soft tissues and spinal canal: No prevertebral fluid or swelling. No visible canal hematoma. Disc levels: Mild to moderate multilevel degenerative disc disease/spondylosis noted. Upper chest: No acute abnormality Other: None IMPRESSION: 1. No evidence of acute intracranial abnormality. Atrophy and chronic small-vessel white matter ischemic changes. 2. No static evidence of acute injury to the cervical spine. Electronically Signed   By: Harmon Pier M.D.   On: 09/16/2020 17:09   CT Cervical Spine Wo Contrast  Result Date: 09/16/2020 CLINICAL DATA:  75 year old male with altered mental status and fall. EXAM: CT HEAD WITHOUT CONTRAST CT CERVICAL SPINE WITHOUT CONTRAST TECHNIQUE: Multidetector CT imaging of the head and cervical spine was performed following the standard protocol without intravenous contrast. Multiplanar CT image reconstructions of the cervical spine were also generated. COMPARISON:  08/04/2020 CT and prior studies FINDINGS: CT HEAD FINDINGS Brain: No evidence of acute infarction, hemorrhage, hydrocephalus, extra-axial collection or mass lesion/mass effect. Atrophy and chronic small-vessel white matter  ischemic changes are again noted. Vascular: No hyperdense vessel or unexpected calcification. Skull: Normal. Negative for fracture or focal lesion. Sinuses/Orbits: No acute finding. Other: None. CT CERVICAL SPINE FINDINGS Alignment: Normal. Skull base and vertebrae: No acute fracture. No primary bone lesion or focal pathologic process. Soft tissues and spinal canal: No prevertebral fluid or swelling. No visible canal hematoma. Disc levels: Mild to moderate multilevel degenerative disc disease/spondylosis noted. Upper chest: No acute abnormality Other: None IMPRESSION: 1. No evidence of acute intracranial abnormality. Atrophy and chronic small-vessel white matter ischemic changes. 2. No static evidence of acute injury to the cervical  spine. Electronically Signed   By: Harmon Pier M.D.   On: 09/16/2020 17:09   MR BRAIN WO CONTRAST  Result Date: 09/16/2020 CLINICAL DATA:  Encephalopathy EXAM: MRI HEAD WITHOUT CONTRAST TECHNIQUE: Multiplanar, multiecho pulse sequences of the brain and surrounding structures were obtained without intravenous contrast. COMPARISON:  None. FINDINGS: Brain: No acute infarct, mass effect or extra-axial collection. No acute or chronic hemorrhage. Hyperintense T2-weighted signal is moderately widespread throughout the white matter. Generalized volume loss without a clear lobar predilection. The midline structures are normal. Vascular: Major flow voids are preserved. Skull and upper cervical spine: Normal calvarium and skull base. Visualized upper cervical spine and soft tissues are normal. Sinuses/Orbits:No paranasal sinus fluid levels or advanced mucosal thickening. No mastoid or middle ear effusion. Normal orbits. IMPRESSION: 1. No acute intracranial abnormality. 2. Generalized volume loss and findings of chronic microvascular ischemia. Electronically Signed   By: Deatra Robinson M.D.   On: 09/16/2020 22:48   DG Chest Port 1 View  Result Date: 09/16/2020 CLINICAL DATA:  Found down EXAM: PORTABLE CHEST 1 VIEW COMPARISON:  08/04/2020 FINDINGS: Lungs are clear.  No pleural effusion or pneumothorax. The heart is normal in size. IMPRESSION: No evidence of acute cardiopulmonary disease. Electronically Signed   By: Charline Bills M.D.   On: 09/16/2020 21:45   DG Abd Portable 1V  Result Date: 09/16/2020 CLINICAL DATA:  Found down EXAM: PORTABLE ABDOMEN - 1 VIEW COMPARISON:  01/19/2017 FINDINGS: Nonobstructive bowel gas pattern. Mild degenerative changes of the lumbar spine. Visualized bony pelvis appears intact. IMPRESSION: Unremarkable abdominal radiograph. Electronically Signed   By: Charline Bills M.D.   On: 09/16/2020 21:45   US Abdomen Limited RUQ (LIVER/GB)  Result Date: 09/18/2020 CLINICAL DATA:  Elevated  bilirubin EXAM: ULTRASOUND ABDOMEN LIMITED RIGHT UPPER QUADRANT COMPARISON:  None. FINDINGS: Gallbladder: Gallbladder is distended. No gallstones or wall thickening visualized. No sonographic Murphy sign noted by sonographer. Common bile duct: Diameter: 4 mm Liver: No focal lesion identified. Within normal limits in parenchymal echogenicity. Portal vein is patent on color Doppler imaging with normal direction of blood flow towards the liver. Other: None. IMPRESSION: Distended gallbladder without evidence of cholelithiasis or acute cholecystitis. No biliary ductal dilation. Electronically Signed   By: Maudry Mayhew MD   On: 09/18/2020 10:58      Subjective: No complaints.  Appetite good.  Mentation at baseline.  No fever, respiratory distress, vomiting.  No headache, chest pain, abdominal pain.  Discharge Exam: Vitals:   09/18/20 2337 09/19/20 0334  BP: (!) 146/83 (!) 141/68  Pulse: 76 (!) 54  Resp: 18 16  Temp: 98.5 F (36.9 C) 98 F (36.7 C)  SpO2: 97% 95%   Vitals:   09/18/20 1706 09/18/20 1941 09/18/20 2337 09/19/20 0334  BP: 119/73 133/71 (!) 146/83 (!) 141/68  Pulse: (!) 49 60 76 (!) 54  Resp: 16 18 18  16  Temp: 97.7 F (36.5 C) 98.1 F (36.7 C) 98.5 F (36.9 C) 98 F (36.7 C)  TempSrc:  Oral Oral Oral  SpO2: 96% 98% 97% 95%  Weight:      Height:        General: Pt is alert, awake, not in acute distress, lying in bed, eating breakfast. Cardiovascular: Slow, regular, nl S1-S2, no murmurs appreciated.   No LE edema.   Respiratory: Normal respiratory rate and rhythm.  CTAB without rales or wheezes. Abdominal: Abdomen soft and non-tender.  No distension or HSM.   Neuro/Psych: Strength symmetric in upper and lower extremities.  Judgment and insight appear impaired but at baseline.   The results of significant diagnostics from this hospitalization (including imaging, microbiology, ancillary and laboratory) are listed below for reference.     Microbiology: Recent Results  (from the past 240 hour(s))  Resp Panel by RT-PCR (Flu A&B, Covid) Nasopharyngeal Swab     Status: None   Collection Time: 09/16/20  8:18 PM   Specimen: Nasopharyngeal Swab; Nasopharyngeal(NP) swabs in vial transport medium  Result Value Ref Range Status   SARS Coronavirus 2 by RT PCR NEGATIVE NEGATIVE Final    Comment: (NOTE) SARS-CoV-2 target nucleic acids are NOT DETECTED.  The SARS-CoV-2 RNA is generally detectable in upper respiratory specimens during the acute phase of infection. The lowest concentration of SARS-CoV-2 viral copies this assay can detect is 138 copies/mL. A negative result does not preclude SARS-Cov-2 infection and should not be used as the sole basis for treatment or other patient management decisions. A negative result may occur with  improper specimen collection/handling, submission of specimen other than nasopharyngeal swab, presence of viral mutation(s) within the areas targeted by this assay, and inadequate number of viral copies(<138 copies/mL). A negative result must be combined with clinical observations, patient history, and epidemiological information. The expected result is Negative.  Fact Sheet for Patients:  BloggerCourse.com  Fact Sheet for Healthcare Providers:  SeriousBroker.it  This test is no t yet approved or cleared by the [place] FDA and  has been authorized for detection and/or diagnosis of SARS-CoV-2 by FDA under an Emergency Use Authorization (EUA). This EUA will remain  in effect (meaning this test can be used) for the duration of the COVID-19 declaration under Section 564(b)(1) of the Act, 21 U.S.C.section 360bbb-3(b)(1), unless the authorization is terminated  or revoked sooner.       Influenza A by PCR NEGATIVE NEGATIVE Final   Influenza B by PCR NEGATIVE NEGATIVE Final    Comment: (NOTE) The Xpert Xpress SARS-CoV-2/FLU/RSV plus assay is intended as an aid in the  diagnosis of influenza from Nasopharyngeal swab specimens and should not be used as a sole basis for treatment. Nasal washings and aspirates are unacceptable for Xpert Xpress SARS-CoV-2/FLU/RSV testing.  Fact Sheet for Patients: BloggerCourse.com  Fact Sheet for Healthcare Providers: SeriousBroker.it  This test is not yet approved or cleared by the [place] FDA and has been authorized for detection and/or diagnosis of SARS-CoV-2 by FDA under an Emergency Use Authorization (EUA). This EUA will remain in effect (meaning this test can be used) for the duration of the COVID-19 declaration under Section 564(b)(1) of the Act, 21 U.S.C. section 360bbb-3(b)(1), unless the authorization is terminated or revoked.  Performed at [facility] [address] [place], [place] [address]      Labs: BNP (last 3 results) No results for input(s): BNP in the last 8760 hours. Basic Metabolic Panel: Recent Labs  Lab 09/16/20  1614 09/17/20 0837 09/18/20 0529 09/19/20 0519  NA 142 138 138 138  K 3.9 3.4* 3.8 4.1  CL 111 106 105 105  CO2 25 23 28 28   GLUCOSE 113* 89 77 88  BUN 20 17 23 21   CREATININE 0.94 0.94 1.36* 1.14  CALCIUM 8.8* 8.3* 8.6* 8.6*  MG  --   --  2.0 2.0  PHOS  --   --  2.7 2.5   Liver Function Tests: Recent Labs  Lab 09/16/20 1614 09/17/20 0837 09/18/20 0529 09/19/20 0519  AST 19 22 18 17   ALT 15 14 16 12   ALKPHOS 95 94 99 89  BILITOT 1.1 1.5* 1.8* 1.2  PROT 6.9 6.5 6.4* 6.1*  ALBUMIN 4.2 3.9 3.7 3.5   No results for input(s): LIPASE, AMYLASE in the last 168 hours. Recent Labs  Lab 09/16/20 2018  AMMONIA 15   CBC: Recent Labs  Lab 09/16/20 1614 09/17/20 0837 09/18/20 0529 09/19/20 0519  WBC 6.9 6.7 6.7 5.8  NEUTROABS  --  4.8 4.7 4.1  HGB 14.3 14.1 13.6 13.4  HCT 43.4 41.6 39.3 39.9  MCV 97.7 96.5 97.0 97.1  PLT 184 166 188 175   Cardiac Enzymes: Recent Labs  Lab 09/16/20 1614   CKTOTAL 140   BNP: Invalid input(s): POCBNP CBG: Recent Labs  Lab 09/16/20 2042  GLUCAP 88   D-Dimer No results for input(s): DDIMER in the last 72 hours. Hgb A1c No results for input(s): HGBA1C in the last 72 hours. Lipid Profile No results for input(s): CHOL, HDL, LDLCALC, TRIG, CHOLHDL, LDLDIRECT in the last 72 hours. Thyroid function studies Recent Labs    09/16/20 1614  TSH 5.642*   Anemia work up Recent Labs    09/17/20 1617  VITAMINB12 697  FOLATE 17.0   Urinalysis    Component Value Date/Time   COLORURINE YELLOW (A) 09/17/2020 0349   APPEARANCEUR CLEAR (A) 09/17/2020 0349   LABSPEC 1.023 09/17/2020 0349   PHURINE 5.0 09/17/2020 0349   GLUCOSEU NEGATIVE 09/17/2020 0349   HGBUR SMALL (A) 09/17/2020 0349   BILIRUBINUR NEGATIVE 09/17/2020 0349   KETONESUR 5 (A) 09/17/2020 0349   PROTEINUR NEGATIVE 09/17/2020 0349   NITRITE NEGATIVE 09/17/2020 0349   LEUKOCYTESUR NEGATIVE 09/17/2020 0349   Sepsis Labs Invalid input(s): PROCALCITONIN,  WBC,  LACTICIDVEN Microbiology Recent Results (from the past 240 hour(s))  Resp Panel by RT-PCR (Flu A&B, Covid) Nasopharyngeal Swab     Status: None   Collection Time: 09/16/20  8:18 PM   Specimen: Nasopharyngeal Swab; Nasopharyngeal(NP) swabs in vial transport medium  Result Value Ref Range Status   SARS Coronavirus 2 by RT PCR NEGATIVE NEGATIVE Final    Comment: (NOTE) SARS-CoV-2 target nucleic acids are NOT DETECTED.  The SARS-CoV-2 RNA is generally detectable in upper respiratory specimens during the acute phase of infection. The lowest concentration of SARS-CoV-2 viral copies this assay can detect is 138 copies/mL. A negative result does not preclude SARS-Cov-2 infection and should not be used as the sole basis for treatment or other patient management decisions. A negative result may occur with  improper specimen collection/handling, submission of specimen other than nasopharyngeal swab, presence of viral  mutation(s) within the areas targeted by this assay, and inadequate number of viral copies(<138 copies/mL). A negative result must be combined with clinical observations, patient history, and epidemiological information. The expected result is Negative.  Fact Sheet for Patients:  11/18/2020  Fact Sheet for Healthcare Providers:  11/18/2020  This test is no t yet  approved or cleared by the [place] and  has been authorized for detection and/or diagnosis of SARS-CoV-2 by FDA under an Emergency Use Authorization (EUA). This EUA will remain  in effect (meaning this test can be used) for the duration of the COVID-19 declaration under Section 564(b)(1) of the Act, 21 U.S.C.section 360bbb-3(b)(1), unless the authorization is terminated  or revoked sooner.       Influenza A by PCR NEGATIVE NEGATIVE Final   Influenza B by PCR NEGATIVE NEGATIVE Final    Comment: (NOTE) The Xpert Xpress SARS-CoV-2/FLU/RSV plus assay is intended as an aid in the diagnosis of influenza from Nasopharyngeal swab specimens and should not be used as a sole basis for treatment. Nasal washings and aspirates are unacceptable for Xpert Xpress SARS-CoV-2/FLU/RSV testing.  Fact Sheet for Patients: BloggerCourse.com  Fact Sheet for Healthcare Providers: SeriousBroker.it  This test is not yet approved or cleared by the [place] FDA and has been authorized for detection and/or diagnosis of SARS-CoV-2 by FDA under an Emergency Use Authorization (EUA). This EUA will remain in effect (meaning this test can be used) for the duration of the COVID-19 declaration under Section 564(b)(1) of the Act, 21 U.S.C. section 360bbb-3(b)(1), unless the authorization is terminated or revoked.  Performed at [facility], [address], [place], [place] [address]      Time coordinating  discharge: 35 minutes The [place] controlled substances registry was reviewed for this patient     30 Day Unplanned Readmission Risk Score    Flowsheet Row ED to Hosp-Admission (Current) from 09/16/2020 in [facility] ON[facility] (1C)  30 Day Unplanned Readmission Risk Score (%) 19.9 Filed at 09/19/2020 1200       This score is the patient's risk of an unplanned readmission within 30 days of being discharged (0 -100%). The score is based on dignosis, age, lab data, medications, orders, and past utilization.   Low:  0-14.9   Medium: 15-21.9   High: 22-29.9   Extreme: 30 and above             SIGNED:   Alberteen Sam, MD  [facility] 09/19/2020, 12:23 PM

## 2020-09-20 LAB — LEVETIRACETAM LEVEL: Levetiracetam Lvl: 26.4 ug/mL (ref 10.0–40.0)

## 2020-09-22 ENCOUNTER — Emergency Department: Payer: Medicare Other

## 2020-09-22 ENCOUNTER — Encounter: Payer: Self-pay | Admitting: Emergency Medicine

## 2020-09-22 ENCOUNTER — Other Ambulatory Visit: Payer: Self-pay

## 2020-09-22 ENCOUNTER — Inpatient Hospital Stay
Admission: EM | Admit: 2020-09-22 | Discharge: 2020-10-01 | DRG: 071 | Disposition: A | Discharge status: Skilled Nursing Facility | Payer: Medicare Other | Attending: Internal Medicine | Admitting: Internal Medicine

## 2020-09-22 DIAGNOSIS — F0391 Unspecified dementia with behavioral disturbance: Secondary | ICD-10-CM | POA: Diagnosis present

## 2020-09-22 DIAGNOSIS — G9341 Metabolic encephalopathy: Secondary | ICD-10-CM | POA: Diagnosis not present

## 2020-09-22 DIAGNOSIS — R1310 Dysphagia, unspecified: Secondary | ICD-10-CM | POA: Diagnosis present

## 2020-09-22 DIAGNOSIS — R531 Weakness: Secondary | ICD-10-CM

## 2020-09-22 DIAGNOSIS — R400 Somnolence: Secondary | ICD-10-CM

## 2020-09-22 DIAGNOSIS — G4700 Insomnia, unspecified: Secondary | ICD-10-CM | POA: Diagnosis present

## 2020-09-22 DIAGNOSIS — I10 Essential (primary) hypertension: Secondary | ICD-10-CM | POA: Diagnosis present

## 2020-09-22 DIAGNOSIS — I5021 Acute systolic (congestive) heart failure: Secondary | ICD-10-CM

## 2020-09-22 DIAGNOSIS — G309 Alzheimer's disease, unspecified: Secondary | ICD-10-CM | POA: Diagnosis present

## 2020-09-22 DIAGNOSIS — Z20822 Contact with and (suspected) exposure to covid-19: Secondary | ICD-10-CM | POA: Diagnosis present

## 2020-09-22 DIAGNOSIS — F03918 Unspecified dementia, unspecified severity, with other behavioral disturbance: Secondary | ICD-10-CM | POA: Diagnosis present

## 2020-09-22 DIAGNOSIS — G40909 Epilepsy, unspecified, not intractable, without status epilepticus: Secondary | ICD-10-CM

## 2020-09-22 DIAGNOSIS — R4182 Altered mental status, unspecified: Secondary | ICD-10-CM | POA: Diagnosis not present

## 2020-09-22 DIAGNOSIS — G250 Essential tremor: Secondary | ICD-10-CM | POA: Diagnosis present

## 2020-09-22 DIAGNOSIS — F0281 Dementia in other diseases classified elsewhere with behavioral disturbance: Secondary | ICD-10-CM | POA: Diagnosis present

## 2020-09-22 DIAGNOSIS — F17290 Nicotine dependence, other tobacco product, uncomplicated: Secondary | ICD-10-CM | POA: Diagnosis present

## 2020-09-22 DIAGNOSIS — R001 Bradycardia, unspecified: Secondary | ICD-10-CM | POA: Diagnosis present

## 2020-09-22 DIAGNOSIS — Z79899 Other long term (current) drug therapy: Secondary | ICD-10-CM

## 2020-09-22 DIAGNOSIS — N400 Enlarged prostate without lower urinary tract symptoms: Secondary | ICD-10-CM | POA: Diagnosis present

## 2020-09-22 LAB — LACTIC ACID, PLASMA
Lactic Acid, Venous: 1.3 mmol/L (ref 0.5–1.9)
Lactic Acid, Venous: 0.9 mmol/L (ref 0.5–1.9)

## 2020-09-22 LAB — COMPREHENSIVE METABOLIC PANEL
ALT: 31 U/L (ref 0–44)
AST: 37 U/L (ref 15–41)
Albumin: 3.9 g/dL (ref 3.5–5.0)
Alkaline Phosphatase: 98 U/L (ref 38–126)
Anion gap: 6 (ref 5–15)
BUN: 20 mg/dL (ref 8–23)
CO2: 26 mmol/L (ref 22–32)
Calcium: 9 mg/dL (ref 8.9–10.3)
Chloride: 106 mmol/L (ref 98–111)
Creatinine, Ser: 1.1 mg/dL (ref 0.61–1.24)
GFR, Estimated: >60 mL/min (ref >60)
Glucose, Bld: 120 mg/dL — ABNORMAL HIGH (ref 70–99)
Potassium: 3.5 mmol/L (ref 3.5–5.1)
Sodium: 138 mmol/L (ref 135–145)
Total Bilirubin: 1.1 mg/dL (ref 0.3–1.2)
Total Protein: 7 g/dL (ref 6.5–8.1)

## 2020-09-22 LAB — CBC WITH DIFFERENTIAL/PLATELET
Abs Immature Granulocytes: 0.02 10*3/uL (ref 0.00–0.07)
Basophils Absolute: 0 10*3/uL (ref 0.0–0.1)
Basophils Relative: 0 %
Eosinophils Absolute: 0.3 10*3/uL (ref 0.0–0.5)
Eosinophils Relative: 3 %
HCT: 41.7 % (ref 39.0–52.0)
Hemoglobin: 14.6 g/dL (ref 13.0–17.0)
Immature Granulocytes: 0 %
Lymphocytes Relative: 13 %
Lymphs Abs: 1.1 10*3/uL (ref 0.7–4.0)
MCH: 33.3 pg (ref 26.0–34.0)
MCHC: 35 g/dL (ref 30.0–36.0)
MCV: 95.2 fL (ref 80.0–100.0)
Monocytes Absolute: 0.5 10*3/uL (ref 0.1–1.0)
Monocytes Relative: 6 %
Neutro Abs: 6.5 10*3/uL (ref 1.7–7.7)
Neutrophils Relative %: 78 %
Platelets: 181 10*3/uL (ref 150–400)
RBC: 4.38 MIL/uL (ref 4.22–5.81)
RDW: 12.5 % (ref 11.5–15.5)
WBC: 8.4 10*3/uL (ref 4.0–10.5)
nRBC: 0 % (ref 0.0–0.2)

## 2020-09-22 LAB — URINE DRUG SCREEN, QUALITATIVE (ARMC ONLY)
Amphetamines, Ur Screen: NOT DETECTED
Barbiturates, Ur Screen: NOT DETECTED
Benzodiazepine, Ur Scrn: POSITIVE — AB
Cannabinoid 50 Ng, Ur ~~LOC~~: NOT DETECTED
Cocaine Metabolite,Ur ~~LOC~~: NOT DETECTED
MDMA (Ecstasy)Ur Screen: NOT DETECTED
Methadone Scn, Ur: NOT DETECTED
Opiate, Ur Screen: NOT DETECTED
Phencyclidine (PCP) Ur S: NOT DETECTED
Tricyclic, Ur Screen: NOT DETECTED

## 2020-09-22 LAB — URINALYSIS, COMPLETE (UACMP) WITH MICROSCOPIC
Bacteria, UA: NONE SEEN
Bilirubin Urine: NEGATIVE
Glucose, UA: NEGATIVE mg/dL
Ketones, ur: 5 mg/dL — AB
Leukocytes,Ua: NEGATIVE
Nitrite: NEGATIVE
Protein, ur: 30 mg/dL — AB
Specific Gravity, Urine: 1.028 (ref 1.005–1.030)
Squamous Epithelial / HPF: NONE SEEN (ref 0–5)
pH: 5 (ref 5.0–8.0)

## 2020-09-22 LAB — BRAIN NATRIURETIC PEPTIDE: B Natriuretic Peptide: 360 pg/mL — ABNORMAL HIGH (ref 0.0–100.0)

## 2020-09-22 LAB — CBG MONITORING, ED: Glucose-Capillary: 131 mg/dL — ABNORMAL HIGH (ref 70–99)

## 2020-09-22 LAB — CK: Total CK: 272 U/L (ref 49–397)

## 2020-09-22 LAB — RESP PANEL BY RT-PCR (FLU A&B, COVID) ARPGX2
Influenza A by PCR: NEGATIVE
Influenza B by PCR: NEGATIVE
SARS Coronavirus 2 by RT PCR: NEGATIVE

## 2020-09-22 LAB — TROPONIN I (HIGH SENSITIVITY)
Troponin I (High Sensitivity): 23 ng/L — ABNORMAL HIGH (ref <18)
Troponin I (High Sensitivity): 23 ng/L — ABNORMAL HIGH (ref <18)

## 2020-09-22 LAB — ACETAMINOPHEN LEVEL: Acetaminophen (Tylenol), Serum: <10 ug/mL — ABNORMAL LOW (ref 10–30)

## 2020-09-22 MED ORDER — TAMSULOSIN HCL 0.4 MG PO CAPS
0.4000 mg | ORAL_CAPSULE | Freq: Every day | ORAL | Status: DC
  Administered 2020-09-23 – 2020-09-30 (×8): 0.4 mg via ORAL
  Filled 2020-09-22 (×8): qty 1

## 2020-09-22 MED ORDER — LACTATED RINGERS IV SOLN: INTRAVENOUS | Status: DC

## 2020-09-22 MED ORDER — ONDANSETRON HCL 4 MG/2ML IJ SOLN: 4.0000 mg | Freq: Four times a day (QID) | INTRAMUSCULAR | Status: DC | PRN

## 2020-09-22 MED ORDER — LEVETIRACETAM 500 MG PO TABS: 1000.0000 mg | ORAL_TABLET | Freq: Two times a day (BID) | ORAL | Status: DC

## 2020-09-22 MED ORDER — LEVETIRACETAM 500 MG PO TABS: 1000.0000 mg | ORAL_TABLET | Freq: Every day | ORAL | Status: DC

## 2020-09-22 MED ORDER — ACETAMINOPHEN 325 MG PO TABS: 650.0000 mg | ORAL_TABLET | Freq: Four times a day (QID) | ORAL | Status: DC | PRN

## 2020-09-22 MED ORDER — AMANTADINE HCL 50 MG/5ML PO SOLN
50.0000 mg | Freq: Two times a day (BID) | ORAL | Status: DC
  Administered 2020-09-23 – 2020-10-01 (×16): 50 mg via ORAL
  Filled 2020-09-22 (×20): qty 5

## 2020-09-22 MED ORDER — LAMOTRIGINE 100 MG PO TABS
150.0000 mg | ORAL_TABLET | Freq: Two times a day (BID) | ORAL | Status: DC
  Administered 2020-09-23 – 2020-10-01 (×17): 150 mg via ORAL
  Filled 2020-09-22 (×17): qty 2

## 2020-09-22 MED ORDER — SODIUM CHLORIDE 0.9% FLUSH
3.0000 mL | Freq: Two times a day (BID) | INTRAVENOUS | Status: DC
  Administered 2020-09-23 – 2020-09-29 (×13): 3 mL via INTRAVENOUS

## 2020-09-22 MED ORDER — DONEPEZIL HCL 5 MG PO TABS
10.0000 mg | ORAL_TABLET | Freq: Every day | ORAL | Status: DC
  Administered 2020-09-23 – 2020-09-30 (×8): 10 mg via ORAL
  Filled 2020-09-22 (×8): qty 2

## 2020-09-22 MED ORDER — LEVETIRACETAM 500 MG PO TABS: 1500.0000 mg | ORAL_TABLET | Freq: Every day | ORAL | Status: DC

## 2020-09-22 MED ORDER — ENOXAPARIN SODIUM 40 MG/0.4ML IJ SOSY: 40.0000 mg | PREFILLED_SYRINGE | INTRAMUSCULAR | Status: DC

## 2020-09-22 MED ORDER — ENOXAPARIN SODIUM 60 MG/0.6ML IJ SOSY
0.5000 mg/kg | PREFILLED_SYRINGE | INTRAMUSCULAR | Status: DC
  Administered 2020-09-23 – 2020-09-30 (×7): 47.5 mg via SUBCUTANEOUS
  Filled 2020-09-22 (×8): qty 0.6

## 2020-09-22 MED ORDER — ARIPIPRAZOLE 10 MG PO TABS
20.0000 mg | ORAL_TABLET | Freq: Every day | ORAL | Status: DC
  Administered 2020-09-23 – 2020-09-30 (×8): 20 mg via ORAL
  Filled 2020-09-22 (×9): qty 2

## 2020-09-22 MED ORDER — ONDANSETRON HCL 4 MG PO TABS: 4.0000 mg | ORAL_TABLET | Freq: Four times a day (QID) | ORAL | Status: DC | PRN

## 2020-09-22 MED ORDER — ACETAMINOPHEN 650 MG RE SUPP: 650.0000 mg | Freq: Four times a day (QID) | RECTAL | Status: DC | PRN

## 2020-09-22 NOTE — H&P (Signed)
History and Physical    Jeremy NickelsWilliam Henry Enderson Jr. ONG:[id number] DOB: [birth year] DOA: 09/22/2020  PCP: Preston Fleetingevelo, Adrian Mancheno, MD  Patient coming from: Group home  I have personally briefly reviewed patient's old medical records in [facility]  Chief Complaint: Weakness, altered mental status  HPI: Jeremy NickelsWilliam Henry [name] is a 75 y.o. male with medical history significant for advanced dementia with behavioral disturbance and amyloid angiopathy, seizure disorder, sinus bradycardia, hypertension, essential tremor, BPH who presented to the ED from his group home for evaluation of altered mental status and generalized weakness.  Patient is unable to provide any history due to encephalopathy and is otherwise obtained from EDP and chart review.  Patient was recently admitted from 09/16/2020-09/19/2020 for acute metabolic encephalopathy suspected due to dehydration.  EEG was obtained and reportedly unremarkable (cannot find official EEG read in system).  TSH, ammonia, B12, folate were normal, B1 pending.  Patient reportedly with diminished verbal interaction on arrival and improved back to his baseline at time of discharge back to his group home.  Patient resides at [facility].  He was apparently found laying on his back on the floor earlier today (7/9).  He is somnolent and unarousable.  He grimaces and withdraws to noxious stimuli but is otherwise not following commands or verbal.  ED Course:  Initial vitals showed BP 108/79, pulse 62, RR 15, temp 98.1 F, SPO2 95% on room air.  Labs show sodium 138, potassium 3.5, bicarb 26, BUN 20, creatinine 1.10, serum glucose 120, LFTs within normal limits, WBC 8.4, hemoglobin 14.6, platelets 181,000, lactic acid 1.3 > 0.9, high-sensitivity troponin 23x2, acetaminophen level <10.  Urinalysis negative for UTI.  SARS-CoV-2 PCR negative.  Influenza A/B PCR's are negative.  Portable chest x-ray showed enlarged cardiac silhouette and vascular  congestion.  No focal consolidation or effusion.  Appears to be chronic changes on review of prior chest x-rays going back to October 2021.  CT head without contrast negative for evidence of acute intracranial pathology.  Small vessel white matter disease noted.  The hospitalist service was consulted to admit for further evaluation and management.  Review of Systems:  Unable to obtain full review of systems due to encephalopathy.   Past Medical History:  Diagnosis Date   Alzheimer disease (HCC)    Dementia (HCC)    Hypertension    Seizures (HCC)     History reviewed. No pertinent surgical history.  Social History:  reports that he has been smoking cigars. He has never used smokeless tobacco. He reports that he does not drink alcohol. No history on file for drug use.  Allergies  Allergen Reactions   Tetracyclines & Related Hives    No family history on file.   Prior to Admission medications   Medication Sig Start Date End Date Taking? Authorizing Provider  acetaminophen (TYLENOL) 325 MG tablet Take 650 mg by mouth every 6 (six) hours as needed for mild pain or fever.    Yes [provider]  aluminum-magnesium hydroxide-simethicone (MAALOX) 200-200-20 MG/5ML SUSP Take 15-30 mLs by mouth as needed (indigestion). Take per standing as needed orders for indigestion/heartburn   Yes [provider]  Amantadine HCl 100 MG tablet Take 50 mg by mouth 2 (two) times daily.   Yes [provider]  amLODipine (NORVASC) 2.5 MG tablet Take 2.5 mg by mouth daily. 05/07/20  Yes [provider]  ARIPiprazole (ABILIFY) 20 MG tablet Take 20 mg by mouth at bedtime.   Yes [provider]  bismuth subsalicylate (PEPTO BISMOL) 262 MG/15ML suspension Take 30 mLs by mouth as needed. Take per standing as needed orders for nausea   Yes [provider]  Cholecalciferol (VITAMIN D3) 25 MCG (1000 UT) CAPS Take 1 capsule by mouth daily. 05/07/20  Yes [provider]  cyanocobalamin 2000 MCG tablet Take 2,000 mcg by mouth daily. 08/22/20 08/22/21 Yes [provider]  donepezil (ARICEPT) 10 MG tablet Take 10 mg by mouth at bedtime.    Yes [provider]  guaiFENesin (ROBITUSSIN) 100 MG/5ML liquid Take 200 mg by mouth as needed. Take per standing as needed orders for cough   Yes [provider]  lamoTRIgine (LAMICTAL) 150 MG tablet Take 150 mg by mouth 2 (two) times daily. 09/03/20 12/02/20 Yes [provider]  levETIRAcetam (KEPPRA) 500 MG tablet Take 1,000-1,500 mg by mouth 2 (two) times daily. Take 2 tablets (1000mg ) in the morning and 3 tablets (1500mg ) nightly 07/08/19  Yes [provider]  lisinopril (ZESTRIL) 10 MG tablet Take 10 mg by mouth daily. 07/23/20  Yes [provider]  loperamide (IMODIUM) 2 MG capsule Take 2 mg by mouth as needed for diarrhea or loose stools.    Yes [provider]  LORazepam (ATIVAN) 1 MG tablet Take 1 mg by mouth 2 (two) times daily as needed for anxiety.   Yes [provider]  magnesium hydroxide (MILK OF MAGNESIA) 400 MG/5ML suspension Take 15-30 mLs by mouth as needed for mild constipation or moderate constipation. Take per standing as needed orders for constipation   Yes [provider]  neomycin-bacitracin-polymyxin (NEOSPORIN) [phone] ointment Apply 1 application topically as needed. For minor abrasions   Yes [provider]  tamsulosin (FLOMAX) 0.4 MG CAPS capsule Take 0.4 mg by mouth at bedtime.    Yes [provider]  traZODone (DESYREL) 100 MG tablet Take 100 mg by mouth at bedtime.   Yes [provider]    Physical Exam: Vitals:   09/22/20 1800 09/22/20 1830 09/22/20 1900 09/22/20 1930  BP: 108/79 98/61 93/72  111/68  Pulse: 62 (!) 49 61 (!) 50  Resp: 15 13 16 13   Temp:      TempSrc:      SpO2: 95% 92% 94% 98%  Weight:      Height:      Exam limited due to  somnolence/encephalopathy. Constitutional: Resting supine in bed, excessively somnolent and unarousable but appears in NAD, calm, and comfortable Eyes: PERRL, lids and conjunctivae normal ENMT: Mucous membranes are moist. Posterior pharynx clear of any exudate or lesions. Neck: normal, supple, no masses. Respiratory: clear to auscultation anteriorly.  Normal respiratory effort. No accessory muscle use.  Cardiovascular: Sinus bradycardia, no murmurs / rubs / gallops. No extremity edema. 2+ pedal pulses. Abdomen: no tenderness, no masses palpated. No hepatosplenomegaly. Bowel sounds positive.  Musculoskeletal: no clubbing / cyanosis. No joint deformity upper and lower extremities. No contractures. Normal muscle tone.  Skin: no rashes, lesions, ulcers. No induration Neurologic: Somnolent, grimaces and withdraws to painful/noxious stimuli.  Strength appears equal bilaterally. Psychiatric: Somnolent, nonverbal, not following commands.  Labs on Admission: I have personally reviewed following labs and imaging studies  CBC: Recent Labs  Lab 09/16/20 1614 09/17/20 0837 09/18/20 0529 09/19/20 0519 09/22/20 1728  WBC 6.9 6.7 6.7 5.8 8.4  NEUTROABS  --  4.8 4.7 4.1 6.5  HGB 14.3 14.1 13.6 13.4 14.6  HCT 43.4 41.6 39.3 39.9 41.7  MCV 97.7 96.5 97.0 97.1 95.2  PLT 184 166 188 175 181   Basic Metabolic Panel: Recent Labs  Lab 09/16/20 1614 09/17/20 0837 09/18/20 0529 09/19/20 0519 09/22/20 1728  NA 142 138 138 138 138  K 3.9 3.4* 3.8 4.1 3.5  CL 111 106 105 105 106  CO2 25 23 28 28 26   GLUCOSE 113* 89 77 88 120*  BUN 20 17 23 21 20   CREATININE 0.94 0.94 1.36* 1.14 1.10  CALCIUM 8.8* 8.3* 8.6* 8.6* 9.0  MG  --   --  2.0 2.0  --   PHOS  --   --  2.7 2.5  --    GFR: Estimated Creatinine Clearance: 67.5 mL/min (by C-G formula based on SCr of 1.1 mg/dL). Liver Function Tests: Recent Labs  Lab 09/16/20 1614 09/17/20 0837 09/18/20 0529 09/19/20 0519 09/22/20 1728  AST 19 22 18 17   37  ALT 15 14 16 12 31   ALKPHOS 95 94 99 89 98  BILITOT 1.1 1.5* 1.8* 1.2 1.1  PROT 6.9 6.5 6.4* 6.1* 7.0  ALBUMIN 4.2 3.9 3.7 3.5 3.9   No results for input(s): LIPASE, AMYLASE in the last 168 hours. Recent Labs  Lab 09/16/20 2018  AMMONIA 15   Coagulation Profile: No results for input(s): INR, PROTIME in the last 168 hours. Cardiac Enzymes: Recent Labs  Lab 09/16/20 1614  CKTOTAL 140   BNP (last 3 results) No results for input(s): PROBNP in the last 8760 hours. HbA1C: No results for input(s): HGBA1C in the last 72 hours. CBG: Recent Labs  Lab 09/16/20 2042 09/22/20 1722  GLUCAP 88 131*   Lipid Profile: No results for input(s): CHOL, HDL, LDLCALC, TRIG, CHOLHDL, LDLDIRECT in the last 72 hours. Thyroid Function Tests: No results for input(s): TSH, T4TOTAL, FREET4, T3FREE, THYROIDAB in the last 72 hours. Anemia Panel: No results for input(s): VITAMINB12, FOLATE, FERRITIN, TIBC, IRON, RETICCTPCT in the last 72 hours. Urine analysis:    Component Value Date/Time   COLORURINE AMBER (A) 09/22/2020 1720   APPEARANCEUR HAZY (A) 09/22/2020 1720   LABSPEC 1.028 09/22/2020 1720   PHURINE 5.0 09/22/2020 1720   GLUCOSEU NEGATIVE 09/22/2020 1720   HGBUR SMALL (A) 09/22/2020 1720   BILIRUBINUR NEGATIVE 09/22/2020 1720   KETONESUR 5 (A) 09/22/2020 1720   PROTEINUR 30 (A) 09/22/2020 1720   NITRITE NEGATIVE 09/22/2020 1720   LEUKOCYTESUR NEGATIVE 09/22/2020 1720    Radiological Exams on Admission: CT Head Wo Contrast  Result Date: 09/22/2020 CLINICAL DATA:  Head trauma, weakness EXAM: CT HEAD WITHOUT CONTRAST TECHNIQUE: Contiguous axial images were obtained from the base of the skull through the vertex without intravenous contrast. COMPARISON:  09/16/2020 FINDINGS: Brain: No evidence of acute infarction, hemorrhage, hydrocephalus, extra-axial collection or mass lesion/mass effect. Extensive periventricular and deep white matter hypodensity. Vascular: No hyperdense vessel or  unexpected calcification. Skull: Normal. Negative for fracture or focal lesion. Sinuses/Orbits: No acute finding. Other: None. IMPRESSION: No acute intracranial pathology. Small-vessel white matter disease. Electronically Signed   By: 11/23/2020 M.D.   On: 09/22/2020 18:38   DG Chest Portable 1 View  Result Date: 09/22/2020 CLINICAL DATA:  Weakness. EXAM: PORTABLE CHEST 1 VIEW COMPARISON:  Single-view of the chest 09/16/2020. FINDINGS: There is cardiomegaly and vascular congestion. No consolidative process, pneumothorax or effusion. No acute or focal bony abnormality. IMPRESSION: Cardiomegaly and pulmonary vascular congestion. Electronically Signed   By: Lauralyn Primes M.D.   On: 09/22/2020 18:08    EKG: Personally reviewed.  Normal sinus rhythm, rate 60 bpm, first-degree AV block,  LAFB.  Not significantly changed when compared to prior.  Assessment/Plan Principal Problem:   Altered mental status Active Problems:   Essential hypertension   Seizure disorder (HCC)   Dementia with behavioral disturbance (HCC)   Jeremy Christensen. is a 75 y.o. male with medical history significant for advanced dementia with behavioral disturbance and amyloid angiopathy, seizure disorder, sinus bradycardia, hypertension, essential tremor, BPH who is admitted with acute metabolic encephalopathy.  Acute metabolic encephalopathy: Recent admit with similar presentation.  Was felt dehydrated at that time.  Work-up for other etiology was negative.  Currently no evidence of active infection.  Could be progression of his advanced dementia. -Start IV fluid hydration overnight -Hold home trazodone and Ativan -Check CK, ammonia, UDS -Obtain EEG  Seizure disorder: -Resume home Keppra 1000 mg a.m. and 1500 mg p.m., Lamictal 150 mg twice daily -Can convert to IV Keppra and IV Vimpat if not taking oral meds -Obtain EEG  Sinus bradycardia: Chronic and appears stable with rates between 40-60 on  admission.  Hypertension: Holding home amlodipine and lisinopril with soft blood pressure.  Advanced dementia with behavioral changes: Resume home donepezil, Abilify, amantadine.  Insomnia: Holding trazodone.  BPH: Continue Flomax.  DVT prophylaxis: Lovenox Code Status: Full code Family Communication: None available on admission Disposition Plan: From group home and likely return to group home pending improvement in mental status towards baseline Consults called: None Level of care: Med-Surg Admission status:  Status is: Observation  The patient remains OBS appropriate and will d/c before 2 midnights.  Dispo: The patient is from: Group home              Anticipated d/c is to: Group home              Patient currently is not medically stable to d/c.   Darreld Mclean MD [facility]  If 7PM-7AM, please contact night-coverage [email or web address]  09/22/2020, 9:20 PM

## 2020-09-22 NOTE — ED Triage Notes (Signed)
Patient arrives from [facility] for weakness and mobility issues. Patient has a history of seizures, CVA, and memory issues. Patient unable to answer questions at this time.

## 2020-09-22 NOTE — Progress Notes (Signed)
PHARMACIST - PHYSICIAN COMMUNICATION  CONCERNING:  Enoxaparin (Lovenox) for DVT Prophylaxis    RECOMMENDATION: Patient was prescribed enoxaprin 40mg  q24 hours for VTE prophylaxis.   Filed Weights   09/22/20 1723  Weight: 96.9 kg (213 lb 9.6 oz)    Body mass index is 32.48 kg/m.  Estimated Creatinine Clearance: 67.5 mL/min (by C-G formula based on SCr of 1.1 mg/dL).   Based on [facility] policy patient is candidate for enoxaparin 0.5mg /kg TBW SQ every 24 hours based on BMI being >30.  DESCRIPTION: Pharmacy has adjusted enoxaparin dose per [facility] policy.  Patient is now receiving enoxaparin 0.5mg /kg TBW SQ every 24 hours    [facility], PharmD Clinical Pharmacist  09/22/2020 9:47 PM

## 2020-09-22 NOTE — ED Notes (Signed)
Brief is dry.

## 2020-09-22 NOTE — ED Notes (Signed)
Per Doristine Mango at [facility],  patient has not been himself for a few days. Sts the patient is at baseline is more responsive and has unsteady gait. Pt has been agitated, refusing to eat, attempting to walk by himself, and having outbursts.   Per Doristine Mango, pt needs higher level of care or skilled nursing due to worsening dementia.

## 2020-09-22 NOTE — ED Provider Notes (Signed)
[facility] Emergency Department Provider Note   ____________________________________________   Event Date/Time   First MD Initiated Contact with Patient 09/22/20 1717     (approximate)  I have reviewed the triage vital signs and the nursing notes.   HISTORY  Chief Complaint Weakness   HPI Jeremy Christensen. is a 75 y.o. male who comes from a group home.  He was found laying on his back on the floor.  He denies any pain but really cannot tell me anything else.  He cannot tell me how he ended up on the floor.  There is a history of a possible stroke by the people in the group home.  Additionally he may not be acting like himself.  He does have a history of seizure disorder essential tremor dementia and altered mental status.           Past Medical History:  Diagnosis Date   Alzheimer disease (HCC)    Dementia (HCC)    Hypertension    Seizures (HCC)     Patient Active Problem List   Diagnosis Date Noted   AMS (altered mental status) 09/17/2020   Dementia (HCC) 09/16/2020   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/16/2020   Essential tremor 09/16/2020   Seizure disorder (HCC) 10/09/2019   Dementia with behavioral disturbance (HCC) 10/09/2019   Elevated troponin 10/08/2019   Essential hypertension 10/08/2019   Acute confusion 01/20/2017   Altered mental status 01/18/2017   Pressure injury of skin 01/18/2017    History reviewed. No pertinent surgical history.  Prior to Admission medications   Medication Sig Start Date End Date Taking? Authorizing Provider  acetaminophen (TYLENOL) 325 MG tablet Take 650 mg by mouth every 6 (six) hours as needed for mild pain or fever.    Yes [provider]  aluminum-magnesium hydroxide-simethicone (MAALOX) 200-200-20 MG/5ML SUSP Take 15-30 mLs by mouth as needed (indigestion). Take per standing as needed orders for indigestion/heartburn   Yes [provider]  Amantadine HCl 100 MG tablet Take 50 mg by mouth 2 (two) times daily.   Yes [provider]  amLODipine (NORVASC) 2.5 MG tablet Take 2.5 mg by mouth daily. 05/07/20  Yes [provider]  ARIPiprazole (ABILIFY) 20 MG tablet Take 20 mg by mouth at bedtime.   Yes [provider]  bismuth subsalicylate (PEPTO BISMOL) 262 MG/15ML suspension Take 30 mLs by mouth as needed. Take per standing as needed orders for nausea   Yes [provider]  Cholecalciferol (VITAMIN D3) 25 MCG (1000 UT) CAPS Take 1 capsule by mouth daily. 05/07/20  Yes [provider]  cyanocobalamin 2000 MCG tablet Take 2,000 mcg by mouth daily. 08/22/20 08/22/21 Yes [provider]  donepezil (ARICEPT) 10 MG tablet Take 10 mg by mouth at bedtime.    Yes [provider]  guaiFENesin (ROBITUSSIN) 100 MG/5ML liquid Take 200 mg by mouth as needed. Take per standing as needed orders for cough   Yes [provider]  lamoTRIgine (LAMICTAL) 150 MG tablet Take 150 mg by mouth 2 (two) times daily. 09/03/20 12/02/20 Yes [provider]  levETIRAcetam (KEPPRA) 500 MG tablet Take 1,000-1,500 mg by mouth 2 (two) times daily. Take 2 tablets (1000mg ) in the morning and 3 tablets (1500mg ) nightly 07/08/19  Yes [provider]  lisinopril (ZESTRIL) 10 MG tablet Take 10 mg by mouth daily. 07/23/20  Yes [provider]  loperamide (IMODIUM) 2 MG capsule Take 2 mg  by mouth as needed for diarrhea or loose stools.    Yes [provider]  LORazepam (ATIVAN) 1 MG tablet Take 1 mg by mouth 2 (two) times daily as needed for anxiety.   Yes [provider]  magnesium hydroxide (MILK OF MAGNESIA) 400 MG/5ML suspension Take 15-30 mLs by mouth as needed for mild constipation or moderate constipation. Take per standing as needed orders for constipation   Yes [provider]  neomycin-bacitracin-polymyxin (NEOSPORIN) [phone] ointment Apply 1 application  topically as needed. For minor abrasions   Yes [provider]  tamsulosin (FLOMAX) 0.4 MG CAPS capsule Take 0.4 mg by mouth at bedtime.    Yes [provider]  traZODone (DESYREL) 100 MG tablet Take 100 mg by mouth at bedtime.   Yes [provider]    Allergies Tetracyclines & related  No family history on file.  Social History Social History   Tobacco Use   Smoking status: Every Day    Pack years: 0.00    Types: Cigars   Smokeless tobacco: Never  Substance Use Topics   Alcohol use: No    Review of Systems  Unable to obtain due to patient not answering questions ____________________________________________   PHYSICAL EXAM:  VITAL SIGNS: ED Triage Vitals [09/22/20 1717]  Enc Vitals Group     BP      Pulse      Resp      Temp      Temp src      SpO2 95 %     Weight      Height      Head Circumference      Peak Flow      Pain Score      Pain Loc      Pain Edu?      Excl. in GC?     Constitutional: Laying in bed with eyes closed will answer some questions will follow some commands but all I am really able to get out of him is that he is not having any pain.  He will squeeze my fingers but not set. Eyes: What I can see if his conjunctiva look normal but he does not want to open his eyes Head: Head is atraumatic except for an abrasion on the back of the head Nose: No congestion/rhinnorhea. Mouth/Throat: Patient barely opening his mouth Neck: No stridor.   Cardiovascular: Normal rate, regular rhythm. Grossly normal heart sounds.  Good peripheral circulation. Respiratory: Normal respiratory effort.  No retractions. Lungs CTAB. Gastrointestinal: Soft and nontender. No distention. No abdominal bruits. No CVA tenderness. Musculoskeletal: No lower extremity tenderness nor edema.   Neurologic: Patient not cooperating with exam toes are downgoing bilaterally patient crossed his legs himself and leaves and crossed.  He will squeeze my fingers  and will answer some questions Skin:  Skin is warm, dry and intact except as noted in head above. No rash noted.   ____________________________________________   LABS (all labs ordered are listed, but only abnormal results are displayed)  Labs Reviewed  ACETAMINOPHEN LEVEL - Abnormal; Notable for the following components:      Result Value   Acetaminophen (Tylenol), Serum <10 (*)    All other components within normal limits  COMPREHENSIVE METABOLIC PANEL - Abnormal; Notable for the following components:   Glucose, Bld 120 (*)    All other components within normal limits  URINALYSIS, COMPLETE (UACMP) WITH MICROSCOPIC - Abnormal; Notable for the following components:   Color, Urine AMBER (*)  APPearance HAZY (*)    Hgb urine dipstick SMALL (*)    Ketones, ur 5 (*)    Protein, ur 30 (*)    All other components within normal limits  BRAIN NATRIURETIC PEPTIDE - Abnormal; Notable for the following components:   B Natriuretic Peptide 360.0 (*)    All other components within normal limits  URINE DRUG SCREEN, QUALITATIVE ([facility] ONLY) - Abnormal; Notable for the following components:   Benzodiazepine, Ur Scrn POSITIVE (*)    All other components within normal limits  CBG MONITORING, ED - Abnormal; Notable for the following components:   Glucose-Capillary 131 (*)    All other components within normal limits  TROPONIN I (HIGH SENSITIVITY) - Abnormal; Notable for the following components:   Troponin I (High Sensitivity) 23 (*)    All other components within normal limits  TROPONIN I (HIGH SENSITIVITY) - Abnormal; Notable for the following components:   Troponin I (High Sensitivity) 23 (*)    All other components within normal limits  RESP PANEL BY RT-PCR (FLU A&B, COVID) ARPGX2  LACTIC ACID, PLASMA  LACTIC ACID, PLASMA  CBC WITH DIFFERENTIAL/PLATELET  CK  AMMONIA  MAGNESIUM  BASIC METABOLIC PANEL  CBC   ____________________________________________  EKG EKG read interpreted by  me shows normal sinus rhythm at 60 left axis slight insignificant ST elevation in leads V1 and II which was present on previous EKG T wave inversion in leads V1 through 3 somewhat worse than on previous EKG lead III was not inverted previously this may be due to lead placement.  ____________________________________________  RADIOLOGY Jill PolingI, Elius Etheredge,  Abdelrahman Nair F, personally viewed and evaluated these images (plain radiographs) as part of my medical decision making, as well as reviewing the written report by the radiologist.  ED MD interpretation:  Official radiology report(s): CT Head Wo Contrast  Result Date: 09/22/2020 CLINICAL DATA:  Head trauma, weakness EXAM: CT HEAD WITHOUT CONTRAST TECHNIQUE: Contiguous axial images were obtained from the base of the skull through the vertex without intravenous contrast. COMPARISON:  09/16/2020 FINDINGS: Brain: No evidence of acute infarction, hemorrhage, hydrocephalus, extra-axial collection or mass lesion/mass effect. Extensive periventricular and deep white matter hypodensity. Vascular: No hyperdense vessel or unexpected calcification. Skull: Normal. Negative for fracture or focal lesion. Sinuses/Orbits: No acute finding. Other: None. IMPRESSION: No acute intracranial pathology. Small-vessel white matter disease. Electronically Signed   By: Lauralyn PrimesAlex  Bibbey M.D.   On: 09/22/2020 18:38   DG Chest Portable 1 View  Result Date: 09/22/2020 CLINICAL DATA:  Weakness. EXAM: PORTABLE CHEST 1 VIEW COMPARISON:  Single-view of the chest 09/16/2020. FINDINGS: There is cardiomegaly and vascular congestion. No consolidative process, pneumothorax or effusion. No acute or focal bony abnormality. IMPRESSION: Cardiomegaly and pulmonary vascular congestion. Electronically Signed   By: Drusilla Kannerhomas  Dalessio M.D.   On: 09/22/2020 18:08    ____________________________________________   PROCEDURES  Procedure(s) performed (including Critical Care): Critical care time 20 minutes.  This includes  evaluating the patient speaking to the hospitalist and reviewing his old records.  Procedures   ____________________________________________   INITIAL IMPRESSION / ASSESSMENT AND PLAN / ED COURSE   Patient is reportedly altered compared to his baseline.  He has a mark on his head.  He may have had a seizure and may be postictal currently.  The CT does not show any sign of answer cranial injury although it is possible that he could have sustained a concussion as well.  Patient's BNP is somewhat elevated at 360.  His CK is 272  which is within normal limits opponent is also within acceptable limits at 23 with no change over time.  Lactic acid is normal white count is normal there is no sign of infection he is afebrile and not hypotensive.  Patient does appear to have congestive heart failure and he is weaker than usual per EMS history and also altered per their history.  We will get him in the hospital and begin treating his congestive heart failure and observe his mental status and see if it improves.  If not we will have to evaluate him further.             ____________________________________________   FINAL CLINICAL IMPRESSION(S) / ED DIAGNOSES  Final diagnoses:  Weakness  Acute systolic congestive heart failure [facility])     ED Discharge Orders     None        Note:  This document was prepared using Dragon voice recognition software and may include unintentional dictation errors.    Arnaldo Natal, MD 09/23/20 [phone]

## 2020-09-23 DIAGNOSIS — G309 Alzheimer's disease, unspecified: Secondary | ICD-10-CM | POA: Diagnosis present

## 2020-09-23 DIAGNOSIS — F0281 Dementia in other diseases classified elsewhere with behavioral disturbance: Secondary | ICD-10-CM | POA: Diagnosis present

## 2020-09-23 DIAGNOSIS — F17290 Nicotine dependence, other tobacco product, uncomplicated: Secondary | ICD-10-CM | POA: Diagnosis present

## 2020-09-23 DIAGNOSIS — G40909 Epilepsy, unspecified, not intractable, without status epilepticus: Secondary | ICD-10-CM | POA: Diagnosis present

## 2020-09-23 DIAGNOSIS — G250 Essential tremor: Secondary | ICD-10-CM | POA: Diagnosis present

## 2020-09-23 DIAGNOSIS — R4182 Altered mental status, unspecified: Secondary | ICD-10-CM | POA: Diagnosis present

## 2020-09-23 DIAGNOSIS — R001 Bradycardia, unspecified: Secondary | ICD-10-CM | POA: Diagnosis present

## 2020-09-23 DIAGNOSIS — Z20822 Contact with and (suspected) exposure to covid-19: Secondary | ICD-10-CM | POA: Diagnosis present

## 2020-09-23 DIAGNOSIS — N400 Enlarged prostate without lower urinary tract symptoms: Secondary | ICD-10-CM | POA: Diagnosis present

## 2020-09-23 DIAGNOSIS — F0391 Unspecified dementia with behavioral disturbance: Secondary | ICD-10-CM | POA: Diagnosis not present

## 2020-09-23 DIAGNOSIS — R531 Weakness: Secondary | ICD-10-CM | POA: Diagnosis not present

## 2020-09-23 DIAGNOSIS — I5021 Acute systolic (congestive) heart failure: Secondary | ICD-10-CM | POA: Diagnosis not present

## 2020-09-23 DIAGNOSIS — R1310 Dysphagia, unspecified: Secondary | ICD-10-CM | POA: Diagnosis present

## 2020-09-23 DIAGNOSIS — I10 Essential (primary) hypertension: Secondary | ICD-10-CM | POA: Diagnosis present

## 2020-09-23 DIAGNOSIS — G9341 Metabolic encephalopathy: Secondary | ICD-10-CM | POA: Diagnosis present

## 2020-09-23 DIAGNOSIS — R400 Somnolence: Secondary | ICD-10-CM | POA: Diagnosis not present

## 2020-09-23 DIAGNOSIS — Z79899 Other long term (current) drug therapy: Secondary | ICD-10-CM | POA: Diagnosis not present

## 2020-09-23 DIAGNOSIS — G4700 Insomnia, unspecified: Secondary | ICD-10-CM | POA: Diagnosis present

## 2020-09-23 LAB — CBC
HCT: 41.4 % (ref 39.0–52.0)
Hemoglobin: 14 g/dL (ref 13.0–17.0)
MCH: 32.6 pg (ref 26.0–34.0)
MCHC: 33.8 g/dL (ref 30.0–36.0)
MCV: 96.3 fL (ref 80.0–100.0)
Platelets: 182 10*3/uL (ref 150–400)
RBC: 4.3 MIL/uL (ref 4.22–5.81)
RDW: 12.8 % (ref 11.5–15.5)
WBC: 7.1 10*3/uL (ref 4.0–10.5)
nRBC: 0 % (ref 0.0–0.2)

## 2020-09-23 LAB — BASIC METABOLIC PANEL
Anion gap: 6 (ref 5–15)
BUN: 21 mg/dL (ref 8–23)
CO2: 26 mmol/L (ref 22–32)
Calcium: 8.7 mg/dL — ABNORMAL LOW (ref 8.9–10.3)
Chloride: 108 mmol/L (ref 98–111)
Creatinine, Ser: 1.12 mg/dL (ref 0.61–1.24)
GFR, Estimated: >60 mL/min (ref >60)
Glucose, Bld: 87 mg/dL (ref 70–99)
Potassium: 3.4 mmol/L — ABNORMAL LOW (ref 3.5–5.1)
Sodium: 140 mmol/L (ref 135–145)

## 2020-09-23 LAB — PHOSPHORUS: Phosphorus: 2.2 mg/dL — ABNORMAL LOW (ref 2.5–4.6)

## 2020-09-23 LAB — MAGNESIUM: Magnesium: 2.1 mg/dL (ref 1.7–2.4)

## 2020-09-23 LAB — AMMONIA: Ammonia: 24 umol/L (ref 9–35)

## 2020-09-23 MED ORDER — LEVETIRACETAM IN NACL 1500 MG/100ML IV SOLN
1500.0000 mg | Freq: Once | INTRAVENOUS | Status: AC
  Administered 2020-09-23: 1500 mg via INTRAVENOUS
  Filled 2020-09-23: qty 100

## 2020-09-23 MED ORDER — SODIUM CHLORIDE 0.9 % IV SOLN
100.0000 mg | Freq: Two times a day (BID) | INTRAVENOUS | Status: DC
  Administered 2020-09-24 – 2020-09-25 (×3): 100 mg via INTRAVENOUS
  Filled 2020-09-23 (×4): qty 10

## 2020-09-23 MED ORDER — SODIUM CHLORIDE 0.9 % IV SOLN
100.0000 mg | Freq: Once | INTRAVENOUS | Status: AC
  Administered 2020-09-23: 100 mg via INTRAVENOUS
  Filled 2020-09-23: qty 10

## 2020-09-23 MED ORDER — HYDRALAZINE HCL 20 MG/ML IJ SOLN: 10.0000 mg | Freq: Four times a day (QID) | INTRAMUSCULAR | Status: DC | PRN

## 2020-09-23 MED ORDER — DEXTROSE-NACL 5-0.9 % IV SOLN: INTRAVENOUS | Status: DC

## 2020-09-23 MED ORDER — DEXTROSE-NACL 5-0.9 % IV SOLN: INTRAVENOUS | Status: AC

## 2020-09-23 MED ORDER — LEVETIRACETAM IN NACL 1500 MG/100ML IV SOLN
1500.0000 mg | INTRAVENOUS | Status: DC
  Administered 2020-09-24: 1500 mg via INTRAVENOUS
  Filled 2020-09-23 (×2): qty 100

## 2020-09-23 MED ORDER — LEVETIRACETAM IN NACL 1000 MG/100ML IV SOLN
1000.0000 mg | INTRAVENOUS | Status: DC
  Administered 2020-09-24 – 2020-09-25 (×2): 1000 mg via INTRAVENOUS
  Filled 2020-09-23 (×2): qty 100

## 2020-09-23 NOTE — Progress Notes (Signed)
[facility] Progress Note  Patient: Jeremy Christensen.    OVF:[id number]  DOA: 09/22/2020     Date of Service: the patient was seen and examined on 09/23/2020  Chief Complaint  Patient presents with   Weakness   Brief hospital course: Jeremy Christensen. is a 75 y.o. male with medical history significant for advanced dementia with behavioral disturbance and amyloid angiopathy, seizure disorder, sinus bradycardia, hypertension, essential tremor, BPH who presented to the ED from his group home for evaluation of altered mental status and generalized weakness.  recently admitted from 09/16/2020-09/19/2020 for acute metabolic encephalopathy suspected due to dehydration.  All the work-up was unremarkable and patient improved, discharged back to group home. Patient resides at [facility].  He was apparently found laying on his back on the floor earlier today (7/9).  He is somnolent and unarousable.  He grimaces and withdraws to noxious stimuli but is otherwise not following commands or verbal.   ED Course:  Initial vitals showed BP 108/79, pulse 62, RR 15, temp 98.1 F, SPO2 95% on room air.   Labs show sodium 138, potassium 3.5, bicarb 26, BUN 20, creatinine 1.10, serum glucose 120, LFTs within normal limits, WBC 8.4, hemoglobin 14.6, platelets 181,000, lactic acid 1.3 > 0.9, high-sensitivity troponin 23x2, acetaminophen level <10.  Urinalysis negative for UTI.  SARS-CoV-2 PCR negative.  Influenza A/B PCR's are negative.   Portable chest x-ray showed enlarged cardiac silhouette and vascular congestion.  No focal consolidation or effusion.  Appears to be chronic changes on review of prior chest x-rays going back to October 2021.   CT head without contrast negative for evidence of acute intracranial pathology.  Small vessel white matter disease noted.   The hospitalist service was consulted to admit for further evaluation and management.  Assessment and Plan: Principal  Problem:   Altered mental status Active Problems:   Essential hypertension   Seizure disorder (HCC)   Dementia with behavioral disturbance (HCC)   Jeremy Christensen. is a 75 y.o. male with medical history significant for advanced dementia with behavioral disturbance and amyloid angiopathy, seizure disorder, sinus bradycardia, hypertension, essential tremor, BPH who is admitted with acute metabolic encephalopathy.   Acute metabolic encephalopathy: Recent admit with similar presentation.  Was felt dehydrated at that time.  Work-up for other etiology was negative.  Currently no evidence of active infection.  Could be progression of his advanced dementia. -Start IV fluid hydration overnight -Hold home trazodone and Ativan -CK, ammonia, UDS -Obtain EEG Continue aspiration precaution and fall precautions Started pured diet with nectar thick liquids Follow speech and swallow eval   Seizure disorder: -Resume home Keppra 1000 mg a.m. and 1500 mg p.m., Lamictal 150 mg twice daily -Converted Oral to IV Keppra and IV Vimpat as pt not taking oral meds -Obtain EEG    Sinus bradycardia: Chronic and appears stable with rates between 40-60 on admission.   Hypertension: Holding home amlodipine and lisinopril with soft blood pressure.   Advanced dementia with behavioral changes: Resume home donepezil, Abilify, amantadine.   Insomnia: Holding trazodone.   BPH: Continue Flomax.  Body mass index is 32.48 kg/m.  Interventions:       Diet: Pureed nectar thick DVT Prophylaxis: Subcutaneous Lovenox   Advance goals of care discussion: Full code  Family Communication: family was NOT present at bedside, at the time of interview.  The pt provided permission to discuss medical plan with the family. Opportunity was given to ask question  and all questions were answered satisfactorily.   Disposition:  Pt is from Group Home, admitted with AMS, still has AMS, which precludes a safe  discharge. Discharge to Group Home, when AMS resolves.  Subjective: no acute events, still AAO x zero, unable to offer any complaints    Physical Exam: General:  NAD, laying comfortably  Appear in no distress,  Eyes: PERRLA ENT: Oral Mucosa Clear, moist  Neck: no JVD,  Cardiovascular: S1 and S2 Present, no Murmur,  Respiratory: good respiratory effort, Bilateral Air entry equal and Decreased, no Crackles, no wheezes Abdomen: Bowel Sound present, Soft and no tenderness,  Skin: no rashes Extremities: no Pedal edema, no calf tenderness Neurologic: without any new focal findings Gait not checked due to patient safety concerns  Vitals:   09/22/20 2230 09/22/20 2353 09/23/20 0350 09/23/20 0728  BP: 101/63 136/70 118/72 (!) 146/75  Pulse: (!) 41 (!) 55 61 (!) 57  Resp: 14 15 16 20   Temp:  98.3 F (36.8 C) 98.5 F (36.9 C) 98.6 F (37 C)  TempSrc:      SpO2: 95% 98% 92% 94%  Weight:      Height:        Intake/Output Summary (Last 24 hours) at 09/23/2020 1112 Last data filed at 09/23/2020 0300 Gross per 24 hour  Intake 358.36 ml  Output 100 ml  Net 258.36 ml   Filed Weights   09/22/20 1723  Weight: 96.9 kg    Data Reviewed: I have personally reviewed and interpreted daily labs, tele strips, imagings as discussed above. I reviewed all nursing notes, pharmacy notes, vitals, pertinent old records I have discussed plan of care as described above with RN and patient/family.  CBC: Recent Labs  Lab 09/17/20 0837 09/18/20 0529 09/19/20 0519 09/22/20 1728 09/23/20 0628  WBC 6.7 6.7 5.8 8.4 7.1  NEUTROABS 4.8 4.7 4.1 6.5  --   HGB 14.1 13.6 13.4 14.6 14.0  HCT 41.6 39.3 39.9 41.7 41.4  MCV 96.5 97.0 97.1 95.2 96.3  PLT 166 188 175 181 182   Basic Metabolic Panel: Recent Labs  Lab 09/17/20 0837 09/18/20 0529 09/19/20 0519 09/22/20 1728 09/23/20 0628  NA 138 138 138 138 140  K 3.4* 3.8 4.1 3.5 3.4*  CL 106 105 105 106 108  CO2 23 28 28 26 26   GLUCOSE 89 77 88  120* 87  BUN 17 23 21 20 21   CREATININE 0.94 1.36* 1.14 1.10 1.12  CALCIUM 8.3* 8.6* 8.6* 9.0 8.7*  MG  --  2.0 2.0  --  2.1  PHOS  --  2.7 2.5  --  2.2*    Studies: CT Head Wo Contrast  Result Date: 09/22/2020 CLINICAL DATA:  Head trauma, weakness EXAM: CT HEAD WITHOUT CONTRAST TECHNIQUE: Contiguous axial images were obtained from the base of the skull through the vertex without intravenous contrast. COMPARISON:  09/16/2020 FINDINGS: Brain: No evidence of acute infarction, hemorrhage, hydrocephalus, extra-axial collection or mass lesion/mass effect. Extensive periventricular and deep white matter hypodensity. Vascular: No hyperdense vessel or unexpected calcification. Skull: Normal. Negative for fracture or focal lesion. Sinuses/Orbits: No acute finding. Other: None. IMPRESSION: No acute intracranial pathology. Small-vessel white matter disease. Electronically Signed   By: M.D.   On: 09/22/2020 18:38   DG Chest Portable 1 View  Result Date: 09/22/2020 CLINICAL DATA:  Weakness. EXAM: PORTABLE CHEST 1 VIEW COMPARISON:  Single-view of the chest 09/16/2020. FINDINGS: There is cardiomegaly and vascular congestion. No consolidative process, pneumothorax or effusion.  No acute or focal bony abnormality. IMPRESSION: Cardiomegaly and pulmonary vascular congestion. Electronically Signed   By: Drusilla Kanner M.D.   On: 09/22/2020 18:08    Scheduled Meds:  amantadine  50 mg Oral BID   ARIPiprazole  20 mg Oral QHS   donepezil  10 mg Oral QHS   enoxaparin (LOVENOX) injection  0.5 mg/kg Subcutaneous Q24H   lamoTRIgine  150 mg Oral BID   sodium chloride flush  3 mL Intravenous Q12H   tamsulosin  0.4 mg Oral QHS   Continuous Infusions:  dextrose 5 % and 0.9% NaCl 75 mL/hr at 09/23/20 0752   [START ON 09/24/2020] lacosamide (VIMPAT) IV     [START ON 09/24/2020] levETIRAcetam     [START ON 09/24/2020] levETIRAcetam     PRN Meds: acetaminophen **OR** acetaminophen, ondansetron **OR** ondansetron  (ZOFRAN) IV  Time spent: 35 minutes  Author: Gillis Santa. MD [facility] 09/23/2020 11:12 AM  To reach On-call, see care teams to locate the attending and reach out to them via [email or web address]. If 7PM-7AM, please contact night-coverage If you still have difficulty reaching the attending provider, please page the [facility] (Director on Call) for [facility] on amion for assistance.

## 2020-09-23 NOTE — Plan of Care (Signed)

## 2020-09-24 ENCOUNTER — Inpatient Hospital Stay: Payer: Medicare Other

## 2020-09-24 DIAGNOSIS — R4182 Altered mental status, unspecified: Secondary | ICD-10-CM

## 2020-09-24 LAB — CBC
HCT: 40 % (ref 39.0–52.0)
Hemoglobin: 13.4 g/dL (ref 13.0–17.0)
MCH: 32.3 pg (ref 26.0–34.0)
MCHC: 33.5 g/dL (ref 30.0–36.0)
MCV: 96.4 fL (ref 80.0–100.0)
Platelets: 185 10*3/uL (ref 150–400)
RBC: 4.15 MIL/uL — ABNORMAL LOW (ref 4.22–5.81)
RDW: 12.7 % (ref 11.5–15.5)
WBC: 6.2 10*3/uL (ref 4.0–10.5)
nRBC: 0 % (ref 0.0–0.2)

## 2020-09-24 LAB — BASIC METABOLIC PANEL
Anion gap: 7 (ref 5–15)
BUN: 18 mg/dL (ref 8–23)
CO2: 25 mmol/L (ref 22–32)
Calcium: 8.3 mg/dL — ABNORMAL LOW (ref 8.9–10.3)
Chloride: 109 mmol/L (ref 98–111)
Creatinine, Ser: 1.05 mg/dL (ref 0.61–1.24)
GFR, Estimated: >60 mL/min (ref >60)
Glucose, Bld: 95 mg/dL (ref 70–99)
Potassium: 3.6 mmol/L (ref 3.5–5.1)
Sodium: 141 mmol/L (ref 135–145)

## 2020-09-24 LAB — MAGNESIUM: Magnesium: 1.9 mg/dL (ref 1.7–2.4)

## 2020-09-24 LAB — PHOSPHORUS: Phosphorus: 2.2 mg/dL — ABNORMAL LOW (ref 2.5–4.6)

## 2020-09-24 LAB — VITAMIN B1: Vitamin B1 (Thiamine): 175.8 nmol/L (ref 66.5–200.0)

## 2020-09-24 MED ORDER — K PHOS MONO-SOD PHOS DI & MONO 155-852-130 MG PO TABS
500.0000 mg | ORAL_TABLET | Freq: Once | ORAL | Status: AC
  Administered 2020-09-24: 09:00:00 500 mg via ORAL
  Filled 2020-09-24: qty 2

## 2020-09-24 NOTE — Procedures (Signed)
Patient Name: Jeremy Christensen.  MRN: [id number]  Epilepsy Attending: Charlsie Quest  Referring Physician/Provider: Dr Darreld Mclean Date: 09/24/2020 Duration: 22.59 mins  Patient history: 75 yo M with h/o seizure who presented with ams. EEG to evaluate for seizure.  Level of alertness: Awake  AEDs during EEG study: LEV, LTG  Technical aspects: This EEG study was done with scalp electrodes positioned according to the 10-20 International system of electrode placement. Electrical activity was acquired at a sampling rate of 500Hz  and reviewed with a high frequency filter of 70Hz  and a low frequency filter of 1Hz . EEG data were recorded continuously and digitally stored.   Description: No posterior dominant rhythm was seen. EEG showed continuous generalized 3 to 6 Hz theta-delta slowing admixed with 15-18Hz  generalized beta activity. Hyperventilation and photic stimulation were not performed.     ABNORMALITY - Continuous slow, generalized  IMPRESSION: This study is suggestive of moderate diffuse encephalopathy, nonspecific etiology. No seizures or epileptiform discharges were seen throughout the recording.   [name] 

## 2020-09-24 NOTE — Progress Notes (Signed)
Routine adult EEG completed. Results pending. 

## 2020-09-24 NOTE — Evaluation (Signed)
Clinical/Bedside Swallow Evaluation Patient Details  Name: Jeremy Christensen. MRN: [id number] Date of Birth: 06-May-1945  Today's Date: 09/24/2020 Time: SLP Start Time (ACUTE ONLY): 1210 SLP Stop Time (ACUTE ONLY): 1310 SLP Time Calculation (min) (ACUTE ONLY): 60 min  Past Medical History:  Past Medical History:  Diagnosis Date   Alzheimer disease (HCC)    Dementia (HCC)    Hypertension    Seizures (HCC)    Past Surgical History: History reviewed. No pertinent surgical history. HPI:  Jeremy Jeremy Hageman. is a 75 y.o. male with PMH significant for advanced Dementia with behavioral disturbance and amyloid angiopathy, seizure disorder, sinus bradycardia, hypertension, essential tremor, BPH.  Patient presented to the ED from [facility]  family care group home for evaluation of altered mental status and generalized weakness.  He was recently admitted from 09/16/2020-09/19/2020 for acute metabolic encephalopathy suspected due to dehydration.  All the work-up was unremarkable and patient improved, discharged back to group home on 7/6.  On 7/9, patient was apparently found laying on his back on the floor, unarousable.  EMS was called and patient was brought to the ED .   Assessment / Plan / Recommendation Clinical Impression  Jeremy appears to present w/ oropharyngeal phase dysphagia in light of declined Cognitive status; advanced Dementia. Constant verbalizations, jargon speech noted w/ poor insight into his situation. This can impact his overall awareness/timing of swallow and safety during po tasks which increases risk for aspiration, choking. Jeremy's risk for aspiration is present but can be reduced when following general aspiration precautions, feeding support d/t the Confusion, and when using a modified diet consistency w/ Supervision w/ po's. Jeremy required MOD verbal/tactile/visual cues during po tasks for orinetation to the boluses; oral prep phase d/t Baseline Confusion.     Jeremy consumed several trials of ice chips,  purees, and Thin liquids w/ NO overt clinical s/s of aspiration noted. No decline in vocal quality; no cough, and no decline in respiratory status during/post trials. Oral phase was adequate for bolus management and oral clearing of the boluses given. Soft solids were not attempted d/t Jeremy's Cognitive decline and Poor Dentition status Baseline. Jeremy required MAX support and guidance for self-feeding attempts d/t Cognitive decline. Was able to hold own Cup during drinking which improves safety of swallowing. OM Exam appeared [facility] w/ No unilateral weakness noted during bolus management. Some confusion during OM tasks and oral care.         D/t Jeremy's Baseline, declined Cognitive status and risk for aspiration, recommend a Dysphagia level 1(Puree diet) w/ Thin liquids; general aspiration precautions; reduce Distractions during meals and engage Jeremy during po's at meal for self-feeding. Feeding Support and Supervision. Pills Crushed vs Whole in Puree for safer swallowing. MD/NSG updated. ST services recommends follow w/ Palliative Care for GOC and education re: impact of Cognitive decline/Dementia on swallowing, oral intake. ST services can follow Jeremy at discharge for further education as needed -- largely suspect that Jeremy's Dementia could hamper upgrade of diet. Precautions posted in room. SLP Visit Diagnosis: Dysphagia, unspecified (R13.10) (Cognitive decline baseline, Dementia)    Aspiration Risk  Mild aspiration risk;Risk for inadequate nutrition/hydration (reduced following precautions)    Diet Recommendation  Dysphagia level 1(Puree diet) w/ Thin liquids; general aspiration precautions; reduce Distractions during meals and engage Jeremy during po's at meal for self-feeding. Feeding Support and Supervision.   Medication Administration: Whole meds with puree (vs need to Crush)    Other  Recommendations Recommended Consults:  (Dietician f/u) Oral Care Recommendations:  Oral care BID;Staff/trained caregiver to provide  oral care Other Recommendations:  (n/a)   Follow up Recommendations None      Frequency and Duration min 2x/week  1 week       Prognosis Prognosis for Safe Diet Advancement: Fair Barriers to Reach Goals: Cognitive deficits;Time post onset;Severity of deficits;Behavior      Swallow Study   General Date of Onset: 09/22/20 HPI: Jeremy Jeremy Hageman. is a 75 y.o. male with PMH significant for advanced Dementia with behavioral disturbance and amyloid angiopathy, seizure disorder, sinus bradycardia, hypertension, essential tremor, BPH.  Patient presented to the ED from [facility]  family care group home for evaluation of altered mental status and generalized weakness.  He was recently admitted from 09/16/2020-09/19/2020 for acute metabolic encephalopathy suspected due to dehydration.  All the work-up was unremarkable and patient improved, discharged back to group home on 7/6.  On 7/9, patient was apparently found laying on his back on the floor, unarousable.  EMS was called and patient was brought to the ED . Type of Study: Bedside Swallow Evaluation Previous Swallow Assessment: 09/18/20 - regular diet Diet Prior to this Study: Regular;Thin liquids Temperature Spikes Noted: No (wbc 6.2) Respiratory Status: Room air History of Recent Intubation: No Behavior/Cognition: Alert;Cooperative;Pleasant mood;Confused;Distractible;Requires cueing (constant talking) Oral Cavity Assessment: Within Functional Limits (limited d/t Jeremy's cooperation) Oral Care Completed by SLP: Recent completion by staff Oral Cavity - Dentition: Poor condition;Missing dentition (few on top/bottom) Vision:  (n/a) Self-Feeding Abilities: Total assist (Mitts bilat.) Patient Positioning: Upright in bed (supported Upright positioning w/ pillows) Baseline Vocal Quality: Normal Volitional Cough: Cognitively unable to elicit Volitional Swallow: Unable to elicit    Oral/Motor/Sensory Function Overall Oral Motor/Sensory Function: Within functional limits  (w/ bolus management and general movements)   Ice Chips Ice chips: Within functional limits Presentation: Spoon (fed; 6+ trials)   Thin Liquid Thin Liquid: Within functional limits Presentation: Cup;Self Fed;Straw (supported heavily; ~8 ozs) Other Comments: water chocolate milk    Nectar Thick Nectar Thick Liquid: Within functional limits Presentation: Straw (fed; 2 trials)   Honey Thick Honey Thick Liquid: Not tested   Puree Puree: Within functional limits Presentation: Spoon (fed; ~12 ozs) Other Comments: foods from tray/meal   Solid     Solid: Not tested Other Comments: d/t Cognitive decline         Jerilynn Som, MS, [employer] Speech Language Pathologist Rehab Services [phone] [facility] 09/24/2020,4:31 PM

## 2020-09-24 NOTE — TOC Progression Note (Signed)
Transition of Care [facility]) - Progression Note    Patient Details  Name: Covey Baller. MRN: [id number] Date of Birth: 1945-06-25  Transition of Care [facility]) CM/SW Contact  Hetty Ely, RN Phone Number: 09/24/2020, 4:23 PM  Clinical Narrative:  Received a message from DSS, Roselyn Bering , guardian [phone] that the Group Home manager is concerned about patient requiring a higher level of care. I did speak with her however not able to answer the questions required. The Attending was notified to call..         Expected Discharge Plan and Services                                                 Social Determinants of Health (SDOH) Interventions    Readmission Risk Interventions No flowsheet data found.

## 2020-09-24 NOTE — Progress Notes (Signed)
PROGRESS NOTE  Jeremy Christensen.  DOB: Oct 07, 1945  PCP: Preston Fleeting, MD GYB:[id number]  DOA: 09/22/2020  LOS: 1 day  Hospital Day: 3   Chief Complaint  Patient presents with   Weakness   Brief narrative: Jeremy Humphreys [place] Perra. is a 75 y.o. male with PMH significant for advanced dementia with behavioral disturbance and amyloid angiopathy, seizure disorder, sinus bradycardia, hypertension, essential tremor, BPH. Patient presented to the ED from [facility]  family care group home for evaluation of altered mental status and generalized weakness.  He was recently admitted from 09/16/2020-09/19/2020 for acute metabolic encephalopathy suspected due to dehydration.  All the work-up was unremarkable and patient improved, discharged back to group home on 7/6. On 7/9, patient was apparently found laying on his back on the floor, unarousable.  EMS was called and patient was brought to the ED .  In the ED, patient was afebrile, hemodynamically stable Labs unremarkable Chest x-ray did not show any consolidation, edema or effusion CT scan of head did not show any intracranial pathology.   Patient was admitted to hospitalist service.  Subjective: Patient was seen and examined this morning.  Pleasant elderly Caucasian male.  Propped up in bed.  Not in distress. Looks chronically sick.  Able to open eyes on verbal command.  Was being fed by nursing staff. Able to tell me his name.  Assessment/Plan: Acute metabolic encephalopathy -Clear etiology.  No evidence of infection.  Recently admitted for similar presentation.  No clear triggers he was identified at time.  He was thought to be dehydrated and given adequate hydration. -Currently remains on IV fluid -Ativan and trazodone on hold. -EEG did not show any epileptiform discharges but showed moderate diffuse encephalopathy of nonspecific etiology. -Continue aspiration precaution fall precautions -Pured diet, nectar thick liquid -Speech  and swallow evaluation   Seizure disorder: -Home meds include Keppra 1000 mg a.m. and 1500 mg p.m., Lamictal 150 mg twice daily -Because of poor oral intake, currently on IV Keppra and IV Vimpat  -EEG without any specific epileptiform discharges  Sinus bradycardia Chronic and appears stable with rates between 40-60 on admission.   Hypertension: -amlodipine and lisinopril on hold because of soft blood pressure   Advanced dementia with behavioral changes: -Continue home donepezil, Abilify, amantadine.   Insomnia Holding trazodone.   BPH Continue Flomax.  Mobility: Encourage ambulation when able to follow commands Code Status:   Code Status: Full Code  Nutritional status: Body mass index is 32.48 kg/m.     Diet:  Diet Order             DIET - DYS 1 Room service appropriate? Yes; Fluid consistency: Nectar Thick  Diet effective now                  DVT prophylaxis: Lovenox subcu    Antimicrobials: None Fluid: None Consultants: None Family Communication: None at bedside  Status is: Inpatient  Remains inpatient appropriate because: Continues to have altered mental status  Dispo: The patient is from: Group home              Anticipated d/c is to: Back to group home in next 3 to 4 days              Patient currently is not medically stable to d/c.   Difficult to place patient No     Infusions:   lacosamide (VIMPAT) IV     levETIRAcetam 1,000 mg (09/24/20 0841)   levETIRAcetam  Scheduled Meds:  amantadine  50 mg Oral BID   ARIPiprazole  20 mg Oral QHS   donepezil  10 mg Oral QHS   enoxaparin (LOVENOX) injection  0.5 mg/kg Subcutaneous Q24H   lamoTRIgine  150 mg Oral BID   sodium chloride flush  3 mL Intravenous Q12H   tamsulosin  0.4 mg Oral QHS    Antimicrobials: Anti-infectives (From admission, onward)    None       PRN meds: acetaminophen **OR** acetaminophen, hydrALAZINE, ondansetron **OR** ondansetron (ZOFRAN) IV    Objective: Vitals:   09/24/20 0329 09/24/20 0756  BP: (!) 112/59 (!) 155/81  Pulse: 63 74  Resp: 18 16  Temp: 98.2 F (36.8 C) 98 F (36.7 C)  SpO2: 99% 93%    Intake/Output Summary (Last 24 hours) at 09/24/2020 1416 Last data filed at 09/23/2020 1847 Gross per 24 hour  Intake 528.09 ml  Output --  Net 528.09 ml   Filed Weights   09/22/20 1723  Weight: 96.9 kg   Weight change:  Body mass index is 32.48 kg/m.   Physical Exam: General exam: Pleasant, elderly Caucasian male.  Not in distress Skin: No rashes, lesions or ulcers. HEENT: Atraumatic, normocephalic, no obvious bleeding Lungs: Clear to auscultation bilaterally CVS: Regular rate and rhythm, no murmur GI/Abd soft, nontender, nondistended, bowel sound present CNS: Chronically sick looking.  Opens eyes on verbal command, able to tell his name Psychiatry:.  Depressed look Extremities: No pedal edema, no calf tenderness  Data Review: I have personally reviewed the laboratory data and studies available.  Recent Labs  Lab 09/18/20 0529 09/19/20 0519 09/22/20 1728 09/23/20 0628 09/24/20 0422  WBC 6.7 5.8 8.4 7.1 6.2  NEUTROABS 4.7 4.1 6.5  --   --   HGB 13.6 13.4 14.6 14.0 13.4  HCT 39.3 39.9 41.7 41.4 40.0  MCV 97.0 97.1 95.2 96.3 96.4  PLT 188 175 181 182 185   Recent Labs  Lab 09/18/20 0529 09/19/20 0519 09/22/20 1728 09/23/20 0628 09/24/20 0422  NA 138 138 138 140 141  K 3.8 4.1 3.5 3.4* 3.6  CL 105 105 106 108 109  CO2 28 28 26 26 25   GLUCOSE 77 88 120* 87 95  BUN 23 21 20 21 18   CREATININE 1.36* 1.14 1.10 1.12 1.05  CALCIUM 8.6* 8.6* 9.0 8.7* 8.3*  MG 2.0 2.0  --  2.1 1.9  PHOS 2.7 2.5  --  2.2* 2.2*    F/u labs ordered Unresulted Labs (From admission, onward)     Start     Ordered   09/24/20 0500  Magnesium  Daily,   R      09/23/20 0736   09/24/20 0500  Phosphorus  Daily,   R      09/23/20 0736   09/24/20 0500  CBC  Daily,   R      09/23/20 0736   09/24/20 0500  Basic  metabolic panel  Daily,   R      09/23/20 0736            Signed, 11/25/20, MD [facility] 09/24/2020

## 2020-09-25 DIAGNOSIS — R4182 Altered mental status, unspecified: Secondary | ICD-10-CM | POA: Diagnosis not present

## 2020-09-25 DIAGNOSIS — I5021 Acute systolic (congestive) heart failure: Secondary | ICD-10-CM

## 2020-09-25 LAB — BASIC METABOLIC PANEL
Anion gap: 7 (ref 5–15)
BUN: 16 mg/dL (ref 8–23)
CO2: 25 mmol/L (ref 22–32)
Calcium: 8.6 mg/dL — ABNORMAL LOW (ref 8.9–10.3)
Chloride: 107 mmol/L (ref 98–111)
Creatinine, Ser: 1.04 mg/dL (ref 0.61–1.24)
GFR, Estimated: >60 mL/min (ref >60)
Glucose, Bld: 92 mg/dL (ref 70–99)
Potassium: 3.8 mmol/L (ref 3.5–5.1)
Sodium: 139 mmol/L (ref 135–145)

## 2020-09-25 LAB — PHOSPHORUS: Phosphorus: 2.7 mg/dL (ref 2.5–4.6)

## 2020-09-25 LAB — CBC
HCT: 41.8 % (ref 39.0–52.0)
Hemoglobin: 13.8 g/dL (ref 13.0–17.0)
MCH: 31.7 pg (ref 26.0–34.0)
MCHC: 33 g/dL (ref 30.0–36.0)
MCV: 96.1 fL (ref 80.0–100.0)
Platelets: 199 10*3/uL (ref 150–400)
RBC: 4.35 MIL/uL (ref 4.22–5.81)
RDW: 12.4 % (ref 11.5–15.5)
WBC: 6.2 10*3/uL (ref 4.0–10.5)
nRBC: 0 % (ref 0.0–0.2)

## 2020-09-25 LAB — MAGNESIUM: Magnesium: 2 mg/dL (ref 1.7–2.4)

## 2020-09-25 MED ORDER — LEVETIRACETAM 750 MG PO TABS
1500.0000 mg | ORAL_TABLET | Freq: Every day | ORAL | Status: DC
  Administered 2020-09-25 – 2020-09-30 (×6): 1500 mg via ORAL
  Filled 2020-09-25 (×7): qty 2

## 2020-09-25 MED ORDER — LEVETIRACETAM 500 MG PO TABS
1000.0000 mg | ORAL_TABLET | Freq: Every day | ORAL | Status: DC
  Administered 2020-09-26 – 2020-10-01 (×6): 1000 mg via ORAL
  Filled 2020-09-25 (×7): qty 2

## 2020-09-25 NOTE — Progress Notes (Signed)
     Referral received for Jeremy Christensen. :goals of care discussion. Chart reviewed and updates received. Patient assessed and is unable to engage appropriately in discussions (advanced dementia) Attempted to contact patient's legal guardian Myriam Forehand as well as Roselyn Bering [phone]). Unable to reach. Voicemail left with contact information given.   PMT will re-attempt to contact guardian at a later time/date. Detailed note and recommendations to follow once GOC has been completed.   Thank you for your referral and allowing PMT to assist in Jeremy Verdis Prime Jr.'s care.   Willette Alma, AGPCNP-BC Palliative Medicine Team  Phone: [phone] Pager: [phone] Amion: N. Cousar   NO CHARGE

## 2020-09-25 NOTE — Evaluation (Signed)
Occupational Therapy Evaluation Patient Details Name: Jeremy Christensen. MRN: [id number] DOB: 08-27-1945 Today's Date: 09/25/2020    History of Present Illness 75 y.o. male with PMH significant for advanced dementia with behavioral disturbance and amyloid angiopathy, seizure disorder, sinus bradycardia, HTN, essential tremor, BPH. Pt presented to the ED from group home for evaluation of altered mental status and generalized weakness.  He was recently admitted from 09/16/2020-09/19/2020 for acute metabolic encephalopathy suspected due to dehydration.  All the work-up was unremarkable and patient improved, discharged back to group home on 7/6.  On 7/9, patient was apparently found laying on his back on the floor, unarousable.  EMS was called and patient was brought to the ED .   Clinical Impression   Pt was seen for OT evaluation this date. Prior to hospital admission, pt was living at a group home. Pt unable to provide PLOF/history. Pt alert, appears uncomfortable, and able to state his first name when asked. Responds appropriately to chit chat and simple questions ~50% of the time. Pt noted to be uncovered, linens saturated with urine. Pt briefly covered back up and thermostat adjusted to be slightly warmer while OT collected bedding and new gown. Pt quickly fell asleep, requiring verbal/tactile cues to improve alertness intermittently. Pt ultimately required MAX A for bed level log rolling for linens change, TOTAL A for pericare, LB bathing, and UB dressing at bed level. Currently pt demonstrates impairments as described below (See OT problem list) which functionally limit his ability to perform ADL/self-care tasks. Pt would benefit from skilled OT services to address noted impairments and functional limitations (see below for any additional details) in order to maximize safety and independence while minimizing falls risk and caregiver burden. Upon hospital discharge, recommend trial of OT at STR to  maximize pt safety and return to PLOF while also minimizing caregiver burden and level of care. May benefit from consideration of LTC in the future if pt unable to make functional gains.      Follow Up Recommendations  SNF;Supervision/Assistance - 24 hour    Equipment Recommendations  Other (comment) (TBD)    Recommendations for Other Services       Precautions / Restrictions Precautions Precautions: Fall Restrictions Weight Bearing Restrictions: No      Mobility Bed Mobility Overal bed mobility: Needs Assistance Bed Mobility: Rolling Rolling: Max assist;+2 for safety/equipment   Supine to sit: Mod assist     General bed mobility comments: once asleep, difficult to rouse and required MAX A to roll    Transfers Overall transfer level: Needs assistance   Transfers: Sit to/from Stand Sit to Stand: Mod assist;From elevated surface         General transfer comment: Hand over hand on hand placement and manual cuing on hips to facilitate standing.    Balance Overall balance assessment: Needs assistance Sitting-balance support: Feet supported;No upper extremity supported Sitting balance-Leahy Scale: Fair     Standing balance support: Bilateral upper extremity supported Standing balance-Leahy Scale: Fair                             ADL either performed or assessed with clinical judgement   ADL Overall ADL's : Needs assistance/impaired                                       General ADL Comments: Pt required  TOTAL assist for bed level bathing, dressing, and toileting; anticipate hand over hand assist for grooming and feeding attempts     Vision         Perception     Praxis      Pertinent Vitals/Pain Pain Assessment: Faces Pain Score: 0-No pain Faces Pain Scale: No hurt     Hand Dominance     Extremity/Trunk Assessment Upper Extremity Assessment Upper Extremity Assessment: Difficult to assess due to impaired cognition    Lower Extremity Assessment Lower Extremity Assessment: Difficult to assess due to impaired cognition   Cervical / Trunk Assessment Cervical / Trunk Assessment: Kyphotic   Communication Communication Communication: Other (comment) (Reponds to yes/no questions.)   Cognition Arousal/Alertness: Awake/alert Behavior During Therapy: Flat affect Overall Cognitive Status: No family/caregiver present to determine baseline cognitive functioning                                 General Comments: Pt initially alert, oriented to self, and able to respond appropriately to simple yes/no questions ~50% of the time, once linens changed and dry warm covers provided pt fell fast asleep   General Comments       Exercises     Shoulder Instructions      Home Living Family/patient expects to be discharged to:: Group home                                 Additional Comments: Patient poor historian; unable to provide social history      Prior Functioning/Environment          Comments: Patient poor historian; unable to provide social history        OT Problem List: Decreased strength;Decreased activity tolerance;Decreased safety awareness;Decreased cognition;Impaired balance (sitting and/or standing);Decreased knowledge of use of DME or AE      OT Treatment/Interventions: Self-care/ADL training;Therapeutic exercise;Therapeutic activities;Cognitive remediation/compensation;DME and/or AE instruction;Patient/family education;Balance training    OT Goals(Current goals can be found in the care plan section) Acute Rehab OT Goals Patient Stated Goal: unable to verbalize OT Goal Formulation: Patient unable to participate in goal setting Time For Goal Achievement: 10/09/20 Potential to Achieve Goals: Fair ADL Goals Pt Will Perform Eating: sitting;with min assist (cues as needed to initiate) Pt Will Perform Grooming: sitting;with min assist (cues as needed to initiate) Pt  Will Transfer to Toilet: with mod assist;stand pivot transfer;bedside commode (LRAD for amb) Additional ADL Goal #1: Pt will perform bed mobility to facilitate bed level ADL requiring MOD A and cues as needed to initiate.  OT Frequency: Min 1X/week   Barriers to D/C:            Co-evaluation              AM-PAC OT "6 Clicks" Daily Activity     Outcome Measure Help from another person eating meals?: A Lot Help from another person taking care of personal grooming?: A Lot Help from another person toileting, which includes using toliet, bedpan, or urinal?: Total Help from another person bathing (including washing, rinsing, drying)?: Total Help from another person to put on and taking off regular upper body clothing?: A Lot Help from another person to put on and taking off regular lower body clothing?: Total 6 Click Score: 9   End of Session    Activity Tolerance: Patient tolerated treatment well Patient left: in bed;with  call bell/phone within reach;with bed alarm set  OT Visit Diagnosis: Other abnormalities of gait and mobility (R26.89);Other symptoms and signs involving cognitive function                Time: 4627-0350 OT Time Calculation (min): 25 min Charges:  OT General Charges $OT Visit: 1 Visit OT Evaluation $OT Eval High Complexity: 1 High OT Treatments $Self Care/Home Management : 8-22 mins  Wynona Canes, MPH, MS, OTR/L ascom [phone] 09/25/20, 3:43 PM

## 2020-09-25 NOTE — Evaluation (Signed)
Physical Therapy Evaluation Patient Details Name: Jeremy Christensen. MRN: [id number] DOB: 29-May-1945 Today's Date: 09/25/2020   History of Present Illness  75 y.o. male with PMH significant for advanced dementia with behavioral disturbance and amyloid angiopathy, seizure disorder, sinus bradycardia, HTN, essential tremor, BPH. Pt presented to the ED from group home for evaluation of altered mental status and generalized weakness.  He was recently admitted from 09/16/2020-09/19/2020 for acute metabolic encephalopathy suspected due to dehydration.  All the work-up was unremarkable and patient improved, discharged back to group home on 7/6.  On 7/9, patient was apparently found laying on his back on the floor, unarousable.  EMS was called and patient was brought to the ED .   Clinical Impression  Pt admitted with above diagnosis. Pt supine in bed and agreeable to participate in PT. Mittens donned on pt. Pt poor historian and unable to report any history. A&O x0. After cued to first name, pt able to state his middle name. Pt requires modA and hand over hand facilitation to perform all functional mobility. bed mobility required HoB elevated and bed rails. modA to STS to RW with poor motor planning noted with inability to facilitate forward steps and sequencing RW with max multimodal cuing and PT demo. Pt attempts to turn without picking up feet so further mobility deferred and returned to bed for safety. MinA for returning to supine in bed with hand over hand facilitation. Mittens donned and LE's supported/off loaded with pillows. Pt appears to require 24/7 supervision and skilled assistance for all functional mobility at this time. Pt currently with functional limitations due to the deficits listed below (see PT Problem List). Pt will benefit from skilled PT to increase their independence and safety with mobility to allow discharge to the venue listed below.       Follow Up Recommendations  SNF;Supervision/Assistance - 24 hour    Equipment Recommendations  None recommended by PT;Other (comment) (defer to next venue of care)    Recommendations for Other Services       Precautions / Restrictions Precautions Precautions: Fall Restrictions Weight Bearing Restrictions: No      Mobility  Bed Mobility Overal bed mobility: Needs Assistance Bed Mobility: Supine to Sit     Supine to sit: Mod assist     General bed mobility comments: ModA at trunk to faciliate EOB. Able to scoot with hand over hand guidance to bed rail.    Transfers Overall transfer level: Needs assistance   Transfers: Sit to/from Stand Sit to Stand: Mod assist;From elevated surface         General transfer comment: Hand over hand on hand placement and manual cuing on hips to facilitate standing.  Ambulation/Gait             General Gait Details: deferred due to poor motor planning. Took miniscule steps forward and backward.  Stairs            Wheelchair Mobility    Modified Rankin (Stroke Patients Only)       Balance Overall balance assessment: Needs assistance Sitting-balance support: Feet supported;No upper extremity supported Sitting balance-Leahy Scale: Fair     Standing balance support: Bilateral upper extremity supported Standing balance-Leahy Scale: Fair                               Pertinent Vitals/Pain Pain Assessment: Faces Pain Score: 0-No pain Faces Pain Scale: No hurt    Home  Living Family/patient expects to be discharged to:: Group home                 Additional Comments: Patient poor historian; unable to provide social history    Prior Function           Comments: Patient poor historian; unable to provide social history     Hand Dominance        Extremity/Trunk Assessment   Upper Extremity Assessment Upper Extremity Assessment: Defer to OT evaluation    Lower Extremity Assessment Lower Extremity Assessment:  Overall WFL for tasks assessed;Generalized weakness    Cervical / Trunk Assessment Cervical / Trunk Assessment: Kyphotic  Communication   Communication: Other (comment) (Reponds to yes/no questions.)  Cognition Arousal/Alertness: Awake/alert Behavior During Therapy: Flat affect Overall Cognitive Status: No family/caregiver present to determine baseline cognitive functioning                                 General Comments: A&Ox0. After told first name, pt able to state middle name. Hand over hand for any functional mobility.      General Comments      Exercises     Assessment/Plan    PT Assessment    PT Problem List Decreased activity tolerance;Decreased balance;Decreased mobility;Decreased coordination;Decreased cognition;Decreased knowledge of use of DME;Decreased safety awareness;Decreased knowledge of precautions       PT Treatment Interventions DME instruction;Gait training;Functional mobility training;Therapeutic activities;Therapeutic exercise;Balance training;Stair training;Patient/family education;Cognitive remediation    PT Goals (Current goals can be found in the Care Plan section)  Acute Rehab PT Goals Patient Stated Goal: unable to verbalize PT Goal Formulation: Patient unable to participate in goal setting Time For Goal Achievement: 10/09/20 Potential to Achieve Goals: Fair    Frequency Min 2X/week   Barriers to discharge        Co-evaluation               AM-PAC PT "6 Clicks" Mobility  Outcome Measure Help needed turning from your back to your side while in a flat bed without using bedrails?: A Lot Help needed moving from lying on your back to sitting on the side of a flat bed without using bedrails?: A Lot Help needed moving to and from a bed to a chair (including a wheelchair)?: A Little Help needed standing up from a chair using your arms (e.g., wheelchair or bedside chair)?: A Little Help needed to walk in hospital room?: A  Lot Help needed climbing 3-5 steps with a railing? : A Lot 6 Click Score: 14    End of Session Equipment Utilized During Treatment: Gait belt Activity Tolerance: Patient tolerated treatment well Patient left: in bed;with call bell/phone within reach;with bed alarm set Nurse Communication: Mobility status PT Visit Diagnosis: Muscle weakness (generalized) (M62.81);Difficulty in walking, not elsewhere classified (R26.2);History of falling (Z91.81)    Time: 2637-8588 PT Time Calculation (min) (ACUTE ONLY): 25 min   Charges:   PT Evaluation $PT Eval Moderate Complexity: 1 Mod PT Treatments $Therapeutic Activity: 8-22 mins       Prem Coykendall M. Fairly IV, PT, DPT Physical Therapist- [place]  [facility]  09/25/2020, 2:47 PM

## 2020-09-25 NOTE — NC FL2 (Signed)
[place] MEDICAID FL2 LEVEL OF CARE SCREENING TOOL     IDENTIFICATION  Patient Name: Jeremy Christensen. Birthdate: [birth year] Sex: male Admission Date (Current Location): 09/22/2020  [facility] and [place] Number:  Chiropodist and Address:  [facility], [address], [place], [place] [address]      Provider Number: [id number]  Attending Physician Name and Address:  Lorin Glass, MD  Relative Name and Phone Number:  [place] [facility] Roselyn Bering [phone]    Current Level of Care: Hospital Recommended Level of Care: Skilled Nursing Facility Prior Approval Number:    Date Approved/Denied:   PASRR Number: pending  Discharge Plan: SNF    Current Diagnoses: Patient Active Problem List   Diagnosis Date Noted   AMS (altered mental status) 09/17/2020   Dementia (HCC) 09/16/2020   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/16/2020   Essential tremor 09/16/2020   Seizure disorder (HCC) 10/09/2019   Dementia with behavioral disturbance (HCC) 10/09/2019   Elevated troponin 10/08/2019   Essential hypertension 10/08/2019   Acute confusion 01/20/2017   Altered mental status 01/18/2017   Pressure injury of skin 01/18/2017    Orientation RESPIRATION BLADDER Height & Weight     Self  Normal Incontinent Weight: 96.9 kg Height:  5\' 8"  (172.7 cm)  BEHAVIORAL SYMPTOMS/MOOD NEUROLOGICAL BOWEL NUTRITION STATUS  Other (Comment) (impulsive, poor safety awareness)   Incontinent Diet (see discharge summary)  AMBULATORY STATUS COMMUNICATION OF NEEDS Skin   Extensive Assist Verbally Skin abrasions, Bruising (arms and legs)                       Personal Care Assistance Level of Assistance  Bathing, Feeding, Dressing Bathing Assistance: Limited assistance Feeding assistance: Limited assistance Dressing Assistance: Limited assistance     Functional Limitations Info             SPECIAL CARE FACTORS FREQUENCY   PT (By licensed PT), OT (By licensed OT)     PT Frequency: 5 times per week OT Frequency: 5 times per week            Contractures Contractures Info: Not present    Additional Factors Info  Code Status, Allergies Code Status Info: Full Allergies Info: Tetracyclines and related           Current Medications (09/25/2020):  This is the current hospital active medication list Current Facility-Administered Medications  Medication Dose Route Frequency Provider Last Rate Last Admin   acetaminophen (TYLENOL) tablet 650 mg  650 mg Oral Q6H PRN 11/26/2020, MD       Or   acetaminophen (TYLENOL) suppository 650 mg  650 mg Rectal Q6H PRN Charlsie Quest, MD       amantadine (SYMMETREL) 50 MG/5ML solution 50 mg  50 mg Oral BID Charlsie Quest R, MD   50 mg at 09/25/20 0907   ARIPiprazole (ABILIFY) tablet 20 mg  20 mg Oral QHS 11/26/20 R, MD   20 mg at 09/24/20 2113   donepezil (ARICEPT) tablet 10 mg  10 mg Oral QHS 2114 R, MD   10 mg at 09/24/20 2114   enoxaparin (LOVENOX) injection 47.5 mg  0.5 mg/kg Subcutaneous Q24H 2115, RPH   47.5 mg at 09/24/20 2113   hydrALAZINE (APRESOLINE) injection 10 mg  10 mg Intravenous Q6H PRN 2114, MD       lacosamide (VIMPAT) 100 mg in sodium chloride 0.9 % 25  mL IVPB  100 mg Intravenous Q12H Andris Baumann, MD 70 mL/hr at 09/24/20 2126 100 mg at 09/24/20 2126   lamoTRIgine (LAMICTAL) tablet 150 mg  150 mg Oral BID Charlsie Quest, MD   150 mg at 09/25/20 0906   levETIRAcetam (KEPPRA) IVPB 1000 mg/100 mL premix  1,000 mg Intravenous Q24H Lowella Bandy, RPH 400 mL/hr at 09/25/20 0911 1,000 mg at 09/25/20 0911   levETIRAcetam (KEPPRA) IVPB 1500 mg/ 100 mL premix  1,500 mg Intravenous Q24H Lowella Bandy, RPH 400 mL/hr at 09/24/20 2209 1,500 mg at 09/24/20 2209   ondansetron (ZOFRAN) tablet 4 mg  4 mg Oral Q6H PRN Charlsie Quest, MD       Or   ondansetron [facility]) injection 4 mg  4 mg Intravenous Q6H PRN Darreld Mclean R,  MD       sodium chloride flush (NS) 0.9 % injection 3 mL  3 mL Intravenous Q12H Darreld Mclean R, MD   3 mL at 09/25/20 0907   tamsulosin (FLOMAX) capsule 0.4 mg  0.4 mg Oral QHS Charlsie Quest, MD   0.4 mg at 09/24/20 2114     Discharge Medications: Please see discharge summary for a list of discharge medications.  Relevant Imaging Results:  Relevant Lab Results:   Additional Information SS# [id number]  Allayne Butcher, RN

## 2020-09-25 NOTE — Plan of Care (Signed)
End of Shift Summary:  Alert to self, remained on room air, VSS. Q2T. (+) BM, urine output adequate. Remained free from falls or injury. Bed low and in locked position.   Problem: Clinical Measurements: Goal: Ability to maintain clinical measurements within normal limits will improve Outcome: Progressing Goal: Will remain free from infection Outcome: Progressing Goal: Diagnostic test results will improve Outcome: Progressing Goal: Respiratory complications will improve Outcome: Progressing Goal: Cardiovascular complication will be avoided Outcome: Progressing   Problem: Safety: Goal: Ability to remain free from injury will improve Outcome: Progressing   Problem: Skin Integrity: Goal: Risk for impaired skin integrity will decrease Outcome: Progressing

## 2020-09-25 NOTE — Progress Notes (Signed)
To Whom it may concern:  The above named patient has a primary diagnosis of dementia which supercedes any mental health diagnosis.

## 2020-09-25 NOTE — Progress Notes (Signed)
PROGRESS NOTE  Jeremy Christensen.  DOB: 1945-06-13  PCP: Preston Fleeting, MD DDU:[id number]  DOA: 09/22/2020  LOS: 2 days  Hospital Day: 4   Chief Complaint  Patient presents with   Weakness   Brief narrative: Jeremy Christensen Jeremy Christensen. is a 75 y.o. male with PMH significant for advanced dementia with behavioral disturbance and amyloid angiopathy, seizure disorder, sinus bradycardia, hypertension, essential tremor, BPH. Patient presented to the ED from [facility]  family care group home for evaluation of altered mental status and generalized weakness.  He was recently admitted from 09/16/2020-09/19/2020 for acute metabolic encephalopathy suspected due to dehydration.  All the work-up was unremarkable and patient improved, discharged back to group home on 7/6. On 7/9, patient was apparently found laying on his back on the floor, unarousable.  EMS was called and patient was brought to the ED .  In the ED, patient was afebrile, hemodynamically stable Labs unremarkable Chest x-ray did not show any consolidation, edema or effusion CT scan of head did not show any intracranial pathology.   Patient was admitted to hospitalist service.  Subjective: Patient was seen and examined this morning.  Pleasant elderly Caucasian male.  Propped up in bed.  Not in distress. Looks chronically sick.   Alert, awake, able to verbalize but not oriented to place, person or time.    Assessment/Plan: Acute metabolic encephalopathy -Unclear etiology.  No evidence of infection.  Recently admitted for similar presentation.  No clear triggers he was identified at that time.  He was thought to be dehydrated and given adequate hydration. -Currently remains off IV fluid -Ativan and trazodone on hold. -EEG did not show any epileptiform discharges but showed moderate diffuse encephalopathy of nonspecific etiology. -Continue aspiration precaution fall precautions -Pured diet, nectar thick liquid -Speech and  swallow evaluation   Seizure disorder: -Home meds include Keppra 1000 mg a.m. and 1500 mg p.m., Lamictal 150 mg twice daily -Because of poor oral intake, currently on IV Keppra and IV Vimpat  -EEG without any specific epileptiform discharges -Patient has good oral intake now.  Can switch IV meds to oral.  Sinus bradycardia -Chronic and appears stable with rates between 40-60 on admission.   Hypertension: -amlodipine and lisinopril on hold because of soft blood pressure   Advanced dementia with behavioral changes: -Continue home donepezil, Abilify, amantadine.   Insomnia Holding trazodone.   BPH Continue Flomax.    Mobility: Encourage ambulation when able to follow commands Code Status:   Code Status: Full Code  Nutritional status: Body mass index is 32.48 kg/m.     Diet:  Diet Order             DIET - DYS 1 Room service appropriate? Yes with Assist; Fluid consistency: Thin  Diet effective now                  DVT prophylaxis: Lovenox subcu    Antimicrobials: None Fluid: None Consultants: None Family Communication: None at bedside  Status is: Inpatient  Remains inpatient appropriate because: Continues to have altered mental status  Dispo: The patient is from: Group home              Anticipated d/c is to: Back to group home in next 3 to 4 days              Patient currently is not medically stable to d/c.   Difficult to place patient No     Infusions:   lacosamide (VIMPAT) IV 100  mg (09/25/20 0935)   levETIRAcetam 1,000 mg (09/25/20 0911)   levETIRAcetam 1,500 mg (09/24/20 2209)    Scheduled Meds:  amantadine  50 mg Oral BID   ARIPiprazole  20 mg Oral QHS   donepezil  10 mg Oral QHS   enoxaparin (LOVENOX) injection  0.5 mg/kg Subcutaneous Q24H   lamoTRIgine  150 mg Oral BID   sodium chloride flush  3 mL Intravenous Q12H   tamsulosin  0.4 mg Oral QHS    Antimicrobials: Anti-infectives (From admission, onward)    None       PRN  meds: acetaminophen **OR** acetaminophen, hydrALAZINE, ondansetron **OR** ondansetron (ZOFRAN) IV   Objective: Vitals:   09/25/20 0341 09/25/20 0824  BP: 126/68 (!) 127/55  Pulse: (!) 50 60  Resp: 16 17  Temp: (!) 96.9 F (36.1 C) 98 F (36.7 C)  SpO2: 100% 95%    Intake/Output Summary (Last 24 hours) at 09/25/2020 1035 Last data filed at 09/25/2020 1019 Gross per 24 hour  Intake 649.46 ml  Output 950 ml  Net -300.54 ml    Filed Weights   09/22/20 1723  Weight: 96.9 kg   Weight change:  Body mass index is 32.48 kg/m.   Physical Exam: General exam: Pleasant, elderly Caucasian male.  Not in distress Skin: No rashes, lesions or ulcers. HEENT: Atraumatic, normocephalic, no obvious bleeding Lungs: Clear to auscultation bilaterally CVS: Regular rate and rhythm, no murmur GI/Abd soft, nontender, nondistended, bowel sound present CNS: Chronically sick looking.  Opens eyes on verbal command, able to tell his name Psychiatry:.  Depressed look Extremities: No pedal edema, no calf tenderness  Data Review: I have personally reviewed the laboratory data and studies available.  Recent Labs  Lab 09/19/20 0519 09/22/20 1728 09/23/20 0628 09/24/20 0422 09/25/20 0509  WBC 5.8 8.4 7.1 6.2 6.2  NEUTROABS 4.1 6.5  --   --   --   HGB 13.4 14.6 14.0 13.4 13.8  HCT 39.9 41.7 41.4 40.0 41.8  MCV 97.1 95.2 96.3 96.4 96.1  PLT 175 181 182 185 199    Recent Labs  Lab 09/19/20 0519 09/22/20 1728 09/23/20 0628 09/24/20 0422 09/25/20 0509  NA 138 138 140 141 139  K 4.1 3.5 3.4* 3.6 3.8  CL 105 106 108 109 107  CO2 28 26 26 25 25   GLUCOSE 88 120* 87 95 92  BUN 21 20 21 18 16   CREATININE 1.14 1.10 1.12 1.05 1.04  CALCIUM 8.6* 9.0 8.7* 8.3* 8.6*  MG 2.0  --  2.1 1.9 2.0  PHOS 2.5  --  2.2* 2.2* 2.7     F/u labs ordered Unresulted Labs (From admission, onward)     Start     Ordered   09/24/20 0500  Magnesium  Daily,   R      09/23/20 0736   09/24/20 0500  Phosphorus   Daily,   R      09/23/20 0736   09/24/20 0500  CBC  Daily,   R      09/23/20 0736   09/24/20 0500  Basic metabolic panel  Daily,   R      09/23/20 0736            Signed, 11/25/20, MD [facility] 09/25/2020

## 2020-09-26 DIAGNOSIS — R4182 Altered mental status, unspecified: Secondary | ICD-10-CM | POA: Diagnosis not present

## 2020-09-26 DIAGNOSIS — I5021 Acute systolic (congestive) heart failure: Secondary | ICD-10-CM | POA: Diagnosis not present

## 2020-09-26 DIAGNOSIS — G40909 Epilepsy, unspecified, not intractable, without status epilepticus: Secondary | ICD-10-CM

## 2020-09-26 DIAGNOSIS — F0391 Unspecified dementia with behavioral disturbance: Secondary | ICD-10-CM | POA: Diagnosis not present

## 2020-09-26 DIAGNOSIS — Z7189 Other specified counseling: Secondary | ICD-10-CM

## 2020-09-26 DIAGNOSIS — R531 Weakness: Secondary | ICD-10-CM

## 2020-09-26 DIAGNOSIS — I10 Essential (primary) hypertension: Secondary | ICD-10-CM

## 2020-09-26 DIAGNOSIS — Z515 Encounter for palliative care: Secondary | ICD-10-CM

## 2020-09-26 LAB — CBC
HCT: 39.8 % (ref 39.0–52.0)
Hemoglobin: 13.5 g/dL (ref 13.0–17.0)
MCH: 32.9 pg (ref 26.0–34.0)
MCHC: 33.9 g/dL (ref 30.0–36.0)
MCV: 97.1 fL (ref 80.0–100.0)
Platelets: 193 10*3/uL (ref 150–400)
RBC: 4.1 MIL/uL — ABNORMAL LOW (ref 4.22–5.81)
RDW: 12.3 % (ref 11.5–15.5)
WBC: 6.9 10*3/uL (ref 4.0–10.5)
nRBC: 0 % (ref 0.0–0.2)

## 2020-09-26 LAB — BASIC METABOLIC PANEL
Anion gap: 8 (ref 5–15)
BUN: 20 mg/dL (ref 8–23)
CO2: 25 mmol/L (ref 22–32)
Calcium: 8.7 mg/dL — ABNORMAL LOW (ref 8.9–10.3)
Chloride: 106 mmol/L (ref 98–111)
Creatinine, Ser: 1.04 mg/dL (ref 0.61–1.24)
GFR, Estimated: >60 mL/min (ref >60)
Glucose, Bld: 96 mg/dL (ref 70–99)
Potassium: 4 mmol/L (ref 3.5–5.1)
Sodium: 139 mmol/L (ref 135–145)

## 2020-09-26 LAB — PHOSPHORUS: Phosphorus: 2.6 mg/dL (ref 2.5–4.6)

## 2020-09-26 LAB — MAGNESIUM: Magnesium: 2 mg/dL (ref 1.7–2.4)

## 2020-09-26 MED ORDER — HALOPERIDOL LACTATE 5 MG/ML IJ SOLN: 1.0000 mg | Freq: Once | INTRAMUSCULAR | Status: DC

## 2020-09-26 MED ORDER — HALOPERIDOL LACTATE 5 MG/ML IJ SOLN
1.0000 mg | Freq: Once | INTRAMUSCULAR | Status: AC
  Administered 2020-09-26: 1 mg via INTRAVENOUS
  Filled 2020-09-26: qty 1

## 2020-09-26 NOTE — Progress Notes (Signed)
PROGRESS NOTE  Jeremy Christensen.  DOB: [birth year]  PCP: Preston Fleeting, MD FKC:[id number]  DOA: 09/22/2020  LOS: 3 days  Hospital Day: 5   Chief Complaint  Patient presents with   Weakness   Brief narrative: Jeremy Christensen. is a 75 y.o. male with PMH significant for advanced dementia with behavioral disturbance and amyloid angiopathy, seizure disorder, sinus bradycardia, hypertension, essential tremor, BPH. Patient presented to the ED from [facility]  family care group home for evaluation of altered mental status and generalized weakness.  He was recently admitted from 09/16/2020-09/19/2020 for acute metabolic encephalopathy suspected due to dehydration.  All the work-up was unremarkable and patient improved, discharged back to group home on 7/6. On 7/9, patient was apparently found laying on his back on the floor, unarousable.  EMS was called and patient was brought to the ED .  In the ED, patient was afebrile, hemodynamically stable Labs unremarkable Chest x-ray did not show any consolidation, edema or effusion CT scan of head did not show any intracranial pathology.   Patient was admitted to hospitalist service for further evaluation management See below for details.  Subjective: Patient was seen and examined this morning.   Lying on bed.  Has mittens in his hand.  He has only few words.  Not oriented.  Not in physical distress.    Assessment/Plan: Acute metabolic encephalopathy -Unclear etiology.  No evidence of infection.  Recently admitted for similar presentation.  No clear triggers were identified at that time.  He was thought to be dehydrated and given adequate hydration. -Currently remains off IV fluid -Ativan and trazodone on hold. -EEG did not show any epileptiform discharges but showed moderate diffuse encephalopathy of nonspecific etiology. -Continue aspiration precaution fall precautions -Pured diet, nectar thick liquid -Speech and swallow  evaluation   Seizure disorder: -Home meds include Keppra 1000 mg a.m. and 1500 mg p.m., Lamictal 150 mg twice daily -Because of poor oral intake, currently on IV Keppra and IV Vimpat  -EEG without any specific epileptiform discharges -Patient has good oral intake now. Can switch IV meds to oral.  Sinus bradycardia -Chronic and appears stable with rates between 40-60 on admission.   Hypertension: -amlodipine and lisinopril on hold because of soft blood pressure   Advanced dementia with behavioral changes: -Continue home donepezil, Abilify, amantadine.   Insomnia -trazodone on hold.   BPH Continue Flomax.   Mobility: Encourage ambulation when able to follow commands Code Status:   Code Status: Full Code  Nutritional status: Body mass index is 32.48 kg/m.     Diet:  Diet Order             DIET - DYS 1 Room service appropriate? Yes with Assist; Fluid consistency: Thin  Diet effective now                  DVT prophylaxis: Lovenox subcu    Antimicrobials: None Fluid: None Consultants: None Family Communication: None at bedside  Status is: Inpatient  Remains inpatient appropriate because: Continues to have altered mental status  Dispo: The patient is from: Group home              Anticipated d/c is to: Probably not able to go back to group home.  Long-term placement required.              Patient currently is medically stable to d/c.   Difficult to place patient No     Infusions:     Scheduled  Meds:  amantadine  50 mg Oral BID   ARIPiprazole  20 mg Oral QHS   donepezil  10 mg Oral QHS   enoxaparin (LOVENOX) injection  0.5 mg/kg Subcutaneous Q24H   lamoTRIgine  150 mg Oral BID   levETIRAcetam  1,000 mg Oral Daily   And   levETIRAcetam  1,500 mg Oral QHS   sodium chloride flush  3 mL Intravenous Q12H   tamsulosin  0.4 mg Oral QHS    Antimicrobials: Anti-infectives (From admission, onward)    None       PRN meds: acetaminophen **OR**  acetaminophen, hydrALAZINE, ondansetron **OR** ondansetron (ZOFRAN) IV   Objective: Vitals:   09/26/20 0546 09/26/20 0725  BP: (!) 123/91 (!) 97/51  Pulse: (!) 58 72  Resp: 18 16  Temp: 98.2 F (36.8 C) 98 F (36.7 C)  SpO2: 100%     Intake/Output Summary (Last 24 hours) at 09/26/2020 0959 Last data filed at 09/26/2020 0300 Gross per 24 hour  Intake 600 ml  Output 550 ml  Net 50 ml    Filed Weights   09/22/20 1723  Weight: 96.9 kg   Weight change:  Body mass index is 32.48 kg/m.   Physical Exam: General exam: Pleasant, elderly Caucasian male.  Not in physical distress. Skin: No rashes, lesions or ulcers. HEENT: Atraumatic, normocephalic, no obvious bleeding Lungs: Clear to auscultation bilaterally CVS: Regular rate and rhythm, no murmur GI/Abd soft, nontender, nondistended, bowel sound present CNS: Has mittens in hand.  Alert, awake, has few words.  Unable to answer orientation questions.   Psychiatry:.  Depressed look Extremities: No pedal edema, no calf tenderness  Data Review: I have personally reviewed the laboratory data and studies available.  Recent Labs  Lab 09/22/20 1728 09/23/20 0628 09/24/20 0422 09/25/20 0509 09/26/20 0502  WBC 8.4 7.1 6.2 6.2 6.9  NEUTROABS 6.5  --   --   --   --   HGB 14.6 14.0 13.4 13.8 13.5  HCT 41.7 41.4 40.0 41.8 39.8  MCV 95.2 96.3 96.4 96.1 97.1  PLT 181 182 185 199 193    Recent Labs  Lab 09/22/20 1728 09/23/20 0628 09/24/20 0422 09/25/20 0509 09/26/20 0502  NA 138 140 141 139 139  K 3.5 3.4* 3.6 3.8 4.0  CL 106 108 109 107 106  CO2 26 26 25 25 25   GLUCOSE 120* 87 95 92 96  BUN 20 21 18 16 20   CREATININE 1.10 1.12 1.05 1.04 1.04  CALCIUM 9.0 8.7* 8.3* 8.6* 8.7*  MG  --  2.1 1.9 2.0 2.0  PHOS  --  2.2* 2.2* 2.7 2.6     F/u labs ordered Unresulted Labs (From admission, onward)     Start     Ordered   09/24/20 0500  CBC  Daily,   R      09/23/20 0736   09/24/20 0500  Basic metabolic panel  Daily,   R       09/23/20 0736            Signed, 11/25/20, MD [facility] 09/26/2020

## 2020-09-26 NOTE — Progress Notes (Signed)
Pt refused to take PM medication. Attempted multiple times. Pt still uncooperative.

## 2020-09-26 NOTE — Consult Note (Signed)
Palliative Care Consult Note                                  Date: 09/26/2020   Patient Name: Jeremy Christensen.  DOB: February 23, 1946  MRN: [id number]  Age / Sex: 75 y.o., male  PCP: Revelo, Presley Raddle, MD Referring Physician: Lorin Glass, MD  Reason for Consultation: Establishing goals of care  HPI/Patient Profile: Palliative Care consult requested for goals of care discussion in this 75 y.o. male  with past medical history of seizure disorder, sinus bradycardia, hypertension, BPH, tremors, advanced dementia, and amyloid angiopathy. He was admitted on 09/22/2020 from [facility] group home with complaints of altered mental status and generalized weakness. Patient was recently admitted (7/3-09/19/2020) for acute metabolic encephalopathy due to dehydration. On arrival was somnolent after faciluty found him lying on his back. During work-up chest x-ray showed vascular congestion. CT of head negative for acute abnormalities. Small vessel white matter disease noted. EEG negative for epilpetiform but did show moderate diffuse encephalopathy of nonspecific etiology. He is being followed by speech with recommendations for pureed diet, nectar thick.   Past Medical History:  Diagnosis Date   Alzheimer disease (HCC)    Dementia (HCC)    Hypertension    Seizures (HCC)      Subjective:   This NP Royal Hawthorn reviewed medical records, received report from team, assessed the patient. I spoke with patient's legal guardian Angelyn Punt to discuss diagnosis, prognosis, GOC, EOL wishes disposition and options.  Patient is resting but easily aroused. He has mittens on hands. Unable to assess mentation due to lack of verbal response.    Concept of Palliative Care was introduced as specialized medical care for people and their families living with serious illness.  It focuses on providing relief from the symptoms and stress of a serious illness.  The  goal is to improve quality of life for both the patient and the family. Values and goals of care important to patient and family were attempted to be elicited.   We discussed His current illness and what it means in the larger context of His on-going co-morbidities. Natural disease trajectory and expectations were discussed. Questions and concerns addressed.   I discussed with Milinda Pointer patient's overall condition and decline. With recommendations for SNF as patient's increased level of care is not conducive to previous group home facility. Given patient's deconditioning and the need for assistance with ADLs an assisted living facility would not be able to accommodate and for safety reason in the setting of advanced dementia.    I discussed the importance of continued conversation with DSS workers and the medical providers regarding overall plan of care and treatment options, ensuring decisions are within the context of the patients values and GOCs.  Questions and concerns were addressed.  PMT will continue to support holistically as needed.  Objective:   Primary Diagnoses: Present on Admission:  Altered mental status  Dementia with behavioral disturbance (HCC)  Essential hypertension  AMS (altered mental status)   Scheduled Meds:  amantadine  50 mg Oral BID   ARIPiprazole  20 mg Oral QHS   donepezil  10 mg Oral QHS   enoxaparin (LOVENOX) injection  0.5 mg/kg Subcutaneous Q24H   lamoTRIgine  150 mg Oral BID   levETIRAcetam  1,000 mg Oral Daily   And   levETIRAcetam  1,500 mg Oral QHS   sodium chloride flush  3  mL Intravenous Q12H   tamsulosin  0.4 mg Oral QHS    Continuous Infusions:   PRN Meds: acetaminophen **OR** acetaminophen, hydrALAZINE, ondansetron **OR** ondansetron (ZOFRAN) IV  Allergies  Allergen Reactions   Tetracyclines & Related Hives    Review of Systems  Unable to perform ROS: Dementia    Physical Exam General: NAD, chronically-ill  appearing Cardiovascular: regular rate and rhythm Pulmonary: diminished bilaterally  Abdomen: soft, nontender, + bowel sounds Extremities: no edema, no joint deformities Skin: no rashes, warm and dry Neurological: unable to assess mentation due to minimal verbal response/dementia   Vital Signs:  BP 107/64 (BP Location: Right Arm)   Pulse 71   Temp 97.8 F (36.6 C) (Oral)   Resp 16   Ht 5\' 8"  (1.727 m)   Wt 96.9 kg   SpO2 100%   BMI 32.48 kg/m  Pain Scale: 0-10   Pain Score: Asleep  SpO2: SpO2: 100 % O2 Device:SpO2: 100 % O2 Flow Rate: .   IO: Intake/output summary:  Intake/Output Summary (Last 24 hours) at 09/26/2020 1434 Last data filed at 09/26/2020 1357 Gross per 24 hour  Intake 240 ml  Output 1450 ml  Net -1210 ml    LBM: Last BM Date: 09/25/20 Baseline Weight: Weight: 96.9 kg Most recent weight: Weight: 96.9 kg      Palliative Assessment/Data: PPS 20-30%   Advanced Care Planning:   Primary Decision Maker: LEGAL GUARDIAN  Code Status/Advance Care Planning: Full code  A discussion was had today regarding advanced directives. Concepts specific to code status, artifical feeding and hydration, continued IV antibiotics and rehospitalization was had.     Recommendations provided to guardian in the setting of multiple co-morbidities and disease trajectory. Patient's appetite is improving however given the natural disease trajectory of advanced dementia would not be an ideal candidate for any forms of artificial feeding or PEG due to risk of dislodgement due to confusion and no meaningful improvement. Artificial feedings are not proven to improve quality of life or eliminate the risk of aspiration in elderly adults who are suffering from advanced dementia. Recommendations also provided for consideration of code status change to DNR/DNI with consideration of his current and recurrent admissions due to illness and co-morbidities. In the event of a cardiopulmonary event  patient would not have a meaningful recovery given co-morbidities and sedentary lifestyle. Life prolonging measures would not add any meaningful value his quality of life and chances of survival are minimal.   Recommendations are for completion of MOST form (DNR, limited vs comfort care, no artificial feedings/PEG, antibiotics and IV fluids as needed).   Given patient's co-morbidities and advanced dementia would recommend outpatient palliative support with understanding in the near future patient would be most appropriate for hospice as his dementia advances and quality of life further declines.    Assessment & Plan:   SUMMARY OF RECOMMENDATIONS   Full Code-discussed recommendations for DNR/DNI and completion of MOST form with 11/26/20, Legal Guardian Continue with current plan of care, treat the treatable Guardian confirms plans for SNF with option to transition to LTC.  Recommendation for outpatient palliative support.  PMT will continue to support and follow as needed. Please call team line with urgent needs.  Symptom Management:  Per Attending  Palliative Prophylaxis:  Aspiration, Bowel Regimen, Delirium Protocol, and Oral Care  Additional Recommendations (Limitations, Scope, Preferences): Continue to treat the treatable   Psycho-social/Spiritual:  Desire for further Chaplaincy support: no Additional Recommendations:  ongoing goals of care  Prognosis:  Guarded  Discharge Planning:  Skilled Nursing Facility for rehab with Palliative care service follow-up   Angelyn Punt (DSS Legal Guardian) expressed understanding and was in agreement with this plan.   Time In: 1400 Time Out: 1450 Time Total: 50 min.   Visit consisted of counseling and education dealing with the complex and emotionally intense issues of symptom management and palliative care in the setting of serious and potentially life-threatening illness.Greater than 50%  of this time was spent counseling and  coordinating care related to the above assessment and plan.  Signed by:  Willette Alma, AGPCNP-BC Palliative Medicine Team  Phone: [phone] Pager: [phone] Amion: Thea Alken   Thank you for allowing the Palliative Medicine Team to assist in the care of this patient. Please utilize secure chat with additional questions, if there is no response within 30 minutes please call the above phone number. Palliative Medicine Team providers are available by phone from 7am to 5pm daily and can be reached through the team cell phone.  Should this patient require assistance outside of these hours, please call the patient's attending physician.

## 2020-09-26 NOTE — TOC Initial Note (Signed)
Transition of Care [facility]) - Initial/Assessment Note    Patient Details  Name: Jeremy Christensen. MRN: [id number] Date of Birth: April 16, 1945  [facility]) CM/SW Contact:    Allayne Butcher, RN Phone Number: 09/26/2020, 1:33 PM  Clinical Narrative:                 Patient admitted to the hospital again for altered mental status and falling at group home.  Patient needs a higher level of care.  Plan will be for rehab and then transition to long term care.  No bed offers at this time.  Angelyn Punt updated on bed search and plan for discharge.  TOC continues to search for LTC bed.   Expected Discharge Plan: Skilled Nursing Facility Barriers to Discharge: SNF Pending bed offer   Patient Goals and CMS Choice   CMS Medicare.gov Compare Post Acute Care list provided to:: Legal Guardian Choice offered to / list presented to : [facility] POA / Guardian  Expected Discharge Plan and Services Expected Discharge Plan: Skilled Nursing Facility   Discharge Planning Services: CM Consult Post Acute Care Choice: Skilled Nursing Facility Living arrangements for the past 2 months: Group Home                 DME Arranged: N/A DME Agency: NA       HH Arranged: NA          Prior Living Arrangements/Services Living arrangements for the past 2 months: Group Home Lives with:: Facility Resident Patient language and need for interpreter reviewed:: Yes Do you feel safe going back to the place where you live?: Yes      Need for Family Participation in Patient Care: Yes (Comment) Care giver support system in place?: Yes (comment) (DSS)   Criminal Activity/Legal Involvement Pertinent to Current Situation/Hospitalization: No - Comment as needed  Activities of Daily Living Home Assistive Devices/Equipment: Walker (specify type) ADL Screening (condition at time of admission) Patient's cognitive ability adequate to safely complete daily activities?: No Is the patient deaf or have  difficulty hearing?: No Does the patient have difficulty seeing, even when wearing glasses/contacts?: No Does the patient have difficulty concentrating, remembering, or making decisions?: Yes Patient able to express need for assistance with ADLs?: No Does the patient have difficulty dressing or bathing?: Yes Independently performs ADLs?: No Communication: Needs assistance Is this a change from baseline?: Pre-admission baseline Dressing (OT): Needs assistance Is this a change from baseline?: Pre-admission baseline Grooming: Needs assistance Is this a change from baseline?: Pre-admission baseline Feeding: Dependent Is this a change from baseline?: Change from baseline, expected to last >3 days Bathing: Needs assistance Is this a change from baseline?: Pre-admission baseline Toileting: Needs assistance Is this a change from baseline?: Change from baseline, expected to last >3days In/Out Bed: Needs assistance Is this a change from baseline?: Change from baseline, expected to last >3 days Walks in Home: Needs assistance Does the patient have difficulty walking or climbing stairs?: Yes Weakness of Legs: Both Weakness of Arms/Hands: Both  Permission Sought/Granted Permission sought to share information with : Case Manager, Guardian, Magazine features editor Permission granted to share information with : Yes, Verbal Permission Granted  Share Information with NAME: Angelyn Punt  Permission granted to share info w AGENCY: SNF's  Permission granted to share info w Relationship: legal guardian [place] DSS     Emotional Assessment       Orientation: : Oriented to Self Alcohol / Substance Use: Not  Applicable Psych Involvement: No (comment)  Admission diagnosis:  Altered mental status [R41.82] Weakness [R53.1] Acute systolic congestive heart failure (HCC) [I50.21] AMS (altered mental status) [R41.82] Patient Active Problem List   Diagnosis Date Noted   AMS (altered  mental status) 09/17/2020   Dementia (HCC) 09/16/2020   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/16/2020   Essential tremor 09/16/2020   Seizure disorder (HCC) 10/09/2019   Dementia with behavioral disturbance (HCC) 10/09/2019   Elevated troponin 10/08/2019   Essential hypertension 10/08/2019   Acute confusion 01/20/2017   Altered mental status 01/18/2017   Pressure injury of skin 01/18/2017   PCP:  Preston Fleeting, MD Pharmacy:   [facility] [place], [place] - [address] [place] [address] Phone: [phone] Fax: [phone]     Social Determinants of Health (SDOH) Interventions    Readmission Risk Interventions No flowsheet data found.

## 2020-09-27 DIAGNOSIS — R4182 Altered mental status, unspecified: Secondary | ICD-10-CM | POA: Diagnosis not present

## 2020-09-27 LAB — BASIC METABOLIC PANEL
Anion gap: 8 (ref 5–15)
BUN: 17 mg/dL (ref 8–23)
CO2: 25 mmol/L (ref 22–32)
Calcium: 8.7 mg/dL — ABNORMAL LOW (ref 8.9–10.3)
Chloride: 105 mmol/L (ref 98–111)
Creatinine, Ser: 1.06 mg/dL (ref 0.61–1.24)
GFR, Estimated: >60 mL/min (ref >60)
Glucose, Bld: 91 mg/dL (ref 70–99)
Potassium: 3.9 mmol/L (ref 3.5–5.1)
Sodium: 138 mmol/L (ref 135–145)

## 2020-09-27 LAB — CBC
HCT: 41.8 % (ref 39.0–52.0)
Hemoglobin: 14.1 g/dL (ref 13.0–17.0)
MCH: 32.6 pg (ref 26.0–34.0)
MCHC: 33.7 g/dL (ref 30.0–36.0)
MCV: 96.8 fL (ref 80.0–100.0)
Platelets: 212 10*3/uL (ref 150–400)
RBC: 4.32 MIL/uL (ref 4.22–5.81)
RDW: 12.3 % (ref 11.5–15.5)
WBC: 5.8 10*3/uL (ref 4.0–10.5)
nRBC: 0 % (ref 0.0–0.2)

## 2020-09-27 NOTE — Progress Notes (Signed)
PROGRESS NOTE  Jeremy Christensen.  DOB: Oct 19, 1945  PCP: Preston Fleeting, MD YNW:[id number]  DOA: 09/22/2020  LOS: 4 days  Hospital Day: 6   Chief Complaint  Patient presents with   Weakness   Brief narrative: Jeremy Christensen. is a 75 y.o. male with PMH significant for advanced dementia with behavioral disturbance and amyloid angiopathy, seizure disorder, sinus bradycardia, hypertension, essential tremor, BPH. Patient presented to the ED from [facility]  family care group home for evaluation of altered mental status and generalized weakness.  He was recently admitted from 09/16/2020-09/19/2020 for acute metabolic encephalopathy suspected due to dehydration.  All the work-up was unremarkable and patient improved, discharged back to group home on 7/6. On 7/9, patient was apparently found laying on his back on the floor, unarousable.  EMS was called and patient was brought to the ED .  In the ED, patient was afebrile, hemodynamically stable Labs unremarkable Chest x-ray did not show any consolidation, edema or effusion CT scan of head did not show any intracranial pathology.   Patient was admitted to hospitalist service for further evaluation management See below for details.  Subjective: Patient was seen and examined this morning.   Lying on bed.  Not in distress.  Fidgeting with mittens. Per RN, patient refused to take p.o. medicines last night.  Assessment/Plan: Acute metabolic encephalopathy -Unclear etiology.  No evidence of infection.  Recently admitted for similar presentation.  No clear triggers were identified at that time.  He was thought to be dehydrated and given adequate hydration. -EEG did not show any epileptiform discharges but showed moderate diffuse encephalopathy of nonspecific etiology. -Ativan and trazodone on hold.  Dysphagia -Continue aspiration precaution fall precautions -Pured diet, nectar thick liquid -Speech and swallow evaluation    Seizure disorder: -Home meds include Keppra 1000 mg a.m. and 1500 mg p.m., Lamictal 150 mg twice daily -Because of poor oral intake, currently on IV Keppra and IV Vimpat  -EEG without any specific epileptiform discharges -Patient has good oral intake now. Can switch IV meds to oral.  Sinus bradycardia -Chronic and appears stable with rates between 40-60 on admission.   Hypertension: -amlodipine and lisinopril on hold because of soft blood pressure   Advanced dementia with behavioral changes: -Continue home donepezil, Abilify, amantadine.   Insomnia -trazodone on hold.   BPH Continue Flomax.   Mobility: Encourage ambulation when able to follow commands Code Status:   Code Status: Full Code  Nutritional status: Body mass index is 32.48 kg/m.     Diet:  Diet Order             DIET - DYS 1 Room service appropriate? Yes with Assist; Fluid consistency: Thin  Diet effective now                  DVT prophylaxis: Lovenox subcu    Antimicrobials: None Fluid: None Consultants: None Family Communication: None at bedside  Status is: Inpatient  Remains inpatient appropriate because: Continues to have altered mental status  Dispo: The patient is from: Group home              Anticipated d/c is to: Probably not able to go back to group home.  Long-term placement required.              Patient currently is medically stable to d/c.   Difficult to place patient No     Infusions:     Scheduled Meds:  amantadine  50 mg Oral  BID   ARIPiprazole  20 mg Oral QHS   donepezil  10 mg Oral QHS   enoxaparin (LOVENOX) injection  0.5 mg/kg Subcutaneous Q24H   lamoTRIgine  150 mg Oral BID   levETIRAcetam  1,000 mg Oral Daily   And   levETIRAcetam  1,500 mg Oral QHS   sodium chloride flush  3 mL Intravenous Q12H   tamsulosin  0.4 mg Oral QHS    Antimicrobials: Anti-infectives (From admission, onward)    None       PRN meds: acetaminophen **OR** acetaminophen,  hydrALAZINE, ondansetron **OR** ondansetron (ZOFRAN) IV   Objective: Vitals:   09/26/20 2335 09/27/20 0736  BP: 125/66 130/86  Pulse: (!) 51 60  Resp: 16 20  Temp: 97.9 F (36.6 C) 97.8 F (36.6 C)  SpO2: 96% 94%    Intake/Output Summary (Last 24 hours) at 09/27/2020 1037 Last data filed at 09/27/2020 1018 Gross per 24 hour  Intake 0 ml  Output 1500 ml  Net -1500 ml    Filed Weights   09/22/20 1723  Weight: 96.9 kg   Weight change:  Body mass index is 32.48 kg/m.   Physical Exam: General exam: Pleasant, elderly Caucasian male.  Not in physical distress. Skin: No rashes, lesions or ulcers. HEENT: Atraumatic, normocephalic, no obvious bleeding Lungs: Clear to auscultate bilaterally CVS: Regular rate and rhythm, no murmur GI/Abd soft, nontender, nondistended, bowel sound present CNS: Has mittens in hand.  Alert, awake, has few words.  Unable to answer orientation questions.   Psychiatry:.  Depressed look Extremities: No pedal edema, no calf tenderness  Data Review: I have personally reviewed the laboratory data and studies available.  Recent Labs  Lab 09/22/20 1728 09/23/20 7253 09/24/20 0422 09/25/20 0509 09/26/20 0502 09/27/20 0508  WBC 8.4 7.1 6.2 6.2 6.9 5.8  NEUTROABS 6.5  --   --   --   --   --   HGB 14.6 14.0 13.4 13.8 13.5 14.1  HCT 41.7 41.4 40.0 41.8 39.8 41.8  MCV 95.2 96.3 96.4 96.1 97.1 96.8  PLT 181 182 185 199 193 212    Recent Labs  Lab 09/23/20 0628 09/24/20 0422 09/25/20 0509 09/26/20 0502 09/27/20 0508  NA 140 141 139 139 138  K 3.4* 3.6 3.8 4.0 3.9  CL 108 109 107 106 105  CO2 26 25 25 25 25   GLUCOSE 87 95 92 96 91  BUN 21 18 16 20 17   CREATININE 1.12 1.05 1.04 1.04 1.06  CALCIUM 8.7* 8.3* 8.6* 8.7* 8.7*  MG 2.1 1.9 2.0 2.0  --   PHOS 2.2* 2.2* 2.7 2.6  --      F/u labs ordered Unresulted Labs (From admission, onward)     Start     Ordered   09/24/20 0500  CBC  Daily,   R      09/23/20 0736   09/24/20 0500  Basic  metabolic panel  Daily,   R      09/23/20 0736            Signed, 11/25/20, MD [facility] 09/27/2020

## 2020-09-27 NOTE — TOC Progression Note (Signed)
Transition of Care [facility]) - Progression Note    Patient Details  Name: Ramsey Guadamuz. MRN: [id number] Date of Birth: August 28, 1945  Transition [facility] Care [facility]) CM/SW Contact  Allayne Butcher, RN Phone Number: 09/27/2020, 1:06 PM  Clinical Narrative:    [facility], now [facility] and [facility] will accept the patient for SNF and long term care.  Patient does need to be sitter free for 48 hours before they can accept.  They do have a memory care unit.  Patient will need a repeat COVID before discharge.  RNCM has reached out to [place] from the group home and Dionne Ano from DSS to find out COVID vaccination status.    Expected Discharge Plan: Skilled Nursing Facility Barriers to Discharge: SNF Pending bed offer  Expected Discharge Plan and Services Expected Discharge Plan: Skilled Nursing Facility   Discharge Planning Services: CM Consult Post Acute Care Choice: Skilled Nursing Facility Living arrangements for the past 2 months: Group Home                 DME Arranged: N/A DME Agency: NA       HH Arranged: NA           Social Determinants of Health (SDOH) Interventions    Readmission Risk Interventions No flowsheet data found.

## 2020-09-27 NOTE — Progress Notes (Signed)
Physical Therapy Treatment Patient Details Name: Jeremy Christensen. MRN: [id number] DOB: 1946-03-05 Today's Date: 09/27/2020    History of Present Illness 75 y.o. male with PMH significant for advanced dementia with behavioral disturbance and amyloid angiopathy, seizure disorder, sinus bradycardia, HTN, essential tremor, BPH. Pt presented to the ED from group home for evaluation of altered mental status and generalized weakness.  He was recently admitted from 09/16/2020-09/19/2020 for acute metabolic encephalopathy suspected due to dehydration.  All the work-up was unremarkable and patient improved, discharged back to group home on 7/6.  On 7/9, patient was apparently found laying on his back on the floor, unarousable.  EMS was called and patient was brought to the ED .    PT Comments    Pt received with sitter in room attempting to get OOB with mittens donned. Agreeable to work with PT. Pt more alert today and able to answer simple questions with 1-2 word responses. Responding to simple verbal cues. Still requiring modA for bed mobility and hand over hand facilitation and multimodal cues to sit EOB. Pt responding well to VC "let's go" with minA for STS to RW with hand over hand facilitation on RW. With Max TC on RW and torso for guidance, pt amb 200' with good speed. First 10-20' in room pt had x2 bouts of R/L lat lean causing RW to tip onto 1 wheel requiring modA from PT to correct with no issues with rest of ambulation. Pt supervision for sit to supine. Demonstrates ability to scoot up in bed with multimodal cuing for LE management. D/c recs remain appropriate due to constant need for hands on assist for safety with functional mobility due to cognitive status.     Follow Up Recommendations  SNF;Supervision/Assistance - 24 hour     Equipment Recommendations  None recommended by PT;Other (comment)    Recommendations for Other Services       Precautions / Restrictions  Precautions Precautions: Fall Restrictions Weight Bearing Restrictions: No    Mobility  Bed Mobility Overal bed mobility: Needs Assistance Bed Mobility: Supine to Sit     Supine to sit: Mod assist          Transfers Overall transfer level: Needs assistance Equipment used: Rolling walker (2 wheeled) Transfers: Sit to/from Stand Sit to Stand: Min assist;From elevated surface         General transfer comment: Hand over hand cuing for hand placement on RW and cuing at hips to faciliate standing.  Ambulation/Gait Ambulation/Gait assistance: Min assist Gait Distance (Feet): 200 Feet Assistive device: Rolling walker (2 wheeled) Gait Pattern/deviations: Step-through pattern;Drifts right/left;Narrow base of support Gait velocity: WNL   General Gait Details: Initial Intermittent drifting R/L with RW onto single wheel requiring modA on RW to correct. After 10-20' no issues with drifting.   Stairs             Wheelchair Mobility    Modified Rankin (Stroke Patients Only)       Balance Overall balance assessment: Needs assistance Sitting-balance support: Feet supported;No upper extremity supported Sitting balance-Leahy Scale: Fair     Standing balance support: Bilateral upper extremity supported Standing balance-Leahy Scale: Fair                              Cognition Arousal/Alertness: Awake/alert Behavior During Therapy: Flat affect Overall Cognitive Status: No family/caregiver present to determine baseline cognitive functioning  General Comments: Pt appears more alert today and less lethargic with attempts to get up on his own. Answers simple questions with 1 word answers appropriately.      Exercises Other Exercises Other Exercises: Hand over hand faciliation at torso and RW for ambulation.    General Comments        Pertinent Vitals/Pain Pain Assessment: Faces Pain Score: 0-No pain Faces  Pain Scale: No hurt    Home Living                      Prior Function            PT Goals (current goals can now be found in the care plan section) Acute Rehab PT Goals Patient Stated Goal: unable to verbalize PT Goal Formulation: Patient unable to participate in goal setting Time For Goal Achievement: 10/09/20 Potential to Achieve Goals: Fair Progress towards PT goals: Progressing toward goals    Frequency    Min 2X/week      PT Plan      Co-evaluation              AM-PAC PT "6 Clicks" Mobility   Outcome Measure  Help needed turning from your back to your side while in a flat bed without using bedrails?: A Lot Help needed moving from lying on your back to sitting on the side of a flat bed without using bedrails?: A Lot Help needed moving to and from a bed to a chair (including a wheelchair)?: A Little Help needed standing up from a chair using your arms (e.g., wheelchair or bedside chair)?: A Little Help needed to walk in hospital room?: A Little Help needed climbing 3-5 steps with a railing? : A Lot 6 Click Score: 15    End of Session Equipment Utilized During Treatment: Gait belt Activity Tolerance: Patient tolerated treatment well Patient left: in bed;with call bell/phone within reach;with bed alarm set;with nursing/sitter in room Nurse Communication: Mobility status PT Visit Diagnosis: Muscle weakness (generalized) (M62.81);Difficulty in walking, not elsewhere classified (R26.2);History of falling (Z91.81)     Time: 2979-8921 PT Time Calculation (min) (ACUTE ONLY): 16 min  Charges:  $Therapeutic Activity: 8-22 mins                    Delphia Grates. Fairly IV, PT, DPT Physical Therapist- [place]  [facility]  09/27/2020, 11:40 AM

## 2020-09-28 DIAGNOSIS — R531 Weakness: Secondary | ICD-10-CM | POA: Diagnosis not present

## 2020-09-28 DIAGNOSIS — I5021 Acute systolic (congestive) heart failure: Secondary | ICD-10-CM | POA: Diagnosis not present

## 2020-09-28 DIAGNOSIS — R4182 Altered mental status, unspecified: Secondary | ICD-10-CM | POA: Diagnosis not present

## 2020-09-28 DIAGNOSIS — F0391 Unspecified dementia with behavioral disturbance: Secondary | ICD-10-CM | POA: Diagnosis not present

## 2020-09-28 LAB — BASIC METABOLIC PANEL
Anion gap: 6 (ref 5–15)
BUN: 24 mg/dL — ABNORMAL HIGH (ref 8–23)
CO2: 27 mmol/L (ref 22–32)
Calcium: 8.4 mg/dL — ABNORMAL LOW (ref 8.9–10.3)
Chloride: 104 mmol/L (ref 98–111)
Creatinine, Ser: 1.24 mg/dL (ref 0.61–1.24)
GFR, Estimated: >60 mL/min (ref >60)
Glucose, Bld: 92 mg/dL (ref 70–99)
Potassium: 4.1 mmol/L (ref 3.5–5.1)
Sodium: 137 mmol/L (ref 135–145)

## 2020-09-28 LAB — CBC
HCT: 41 % (ref 39.0–52.0)
Hemoglobin: 13.7 g/dL (ref 13.0–17.0)
MCH: 32.9 pg (ref 26.0–34.0)
MCHC: 33.4 g/dL (ref 30.0–36.0)
MCV: 98.3 fL (ref 80.0–100.0)
Platelets: 219 10*3/uL (ref 150–400)
RBC: 4.17 MIL/uL — ABNORMAL LOW (ref 4.22–5.81)
RDW: 12.4 % (ref 11.5–15.5)
WBC: 5.6 10*3/uL (ref 4.0–10.5)
nRBC: 0 % (ref 0.0–0.2)

## 2020-09-28 MED ORDER — LORAZEPAM 1 MG PO TABS
1.0000 mg | ORAL_TABLET | Freq: Two times a day (BID) | ORAL | Status: DC | PRN
  Administered 2020-09-29 – 2020-10-01 (×4): 1 mg via ORAL
  Filled 2020-09-28 (×4): qty 1

## 2020-09-28 MED ORDER — TRAZODONE HCL 50 MG PO TABS: 100.0000 mg | ORAL_TABLET | Freq: Every day | ORAL | Status: DC

## 2020-09-28 MED ORDER — TRAZODONE HCL 50 MG PO TABS
100.0000 mg | ORAL_TABLET | Freq: Every evening | ORAL | Status: DC | PRN
  Administered 2020-09-28 – 2020-09-30 (×3): 100 mg via ORAL
  Filled 2020-09-28 (×3): qty 2

## 2020-09-28 NOTE — Care Management Important Message (Signed)
Important Message  Patient Details  Name: Jeremy Christensen. MRN: [id number] Date of Birth: August 19, 1945   Medicare Important Message Given:  Other (see comment)  I tried calling the legal guardian again but Myriam Forehand mailbox is full so unable to leave her a message [phone]).  Olegario Messier A Claiborne Stroble 09/28/2020, 2:46 PM

## 2020-09-28 NOTE — Progress Notes (Signed)
OT Cancellation Note  Patient Details Name: Jeremy Christensen. MRN: [id number] DOB: April 24, 1945   Cancelled Treatment:    Reason Eval/Treat Not Completed: Fatigue/lethargy limiting ability to participate. Pt sleeping soundly upon attempt. Will re-attempt at later time.   Wynona Canes, MPH, MS, OTR/L ascom [phone] 09/28/20, 11:27 AM

## 2020-09-28 NOTE — Progress Notes (Signed)
PT Cancellation Note  Patient Details Name: Jeremy Christensen. MRN: [id number] DOB: [birth year]   Cancelled Treatment:    Reason Eval/Treat Not Completed: Patient declined, no reason specified. Pt received sleeping in bed. Pt pleasantly denying PT to return to sleep. PT will re-attempt at a later date as time allows.    Delphia Grates. Fairly IV, PT, DPT Physical Therapist- [place]  [facility]  09/28/2020, 2:39 PM

## 2020-09-28 NOTE — Progress Notes (Addendum)
Speech Language Pathology Treatment: Dysphagia  Patient Details Name: Jeremy Christensen. MRN: [id number] DOB: November 10, 1945 Today's Date: 09/28/2020 Time: 6440-3474 SLP Time Calculation (min) (ACUTE ONLY): 25 min  Assessment / Plan / Recommendation Clinical Impression  Jeremy seen for ongoing monitoring of toleration of diet; assessment if diet consistency (foods) upgrade is appropriate. NSG reported good, quality intake of current diet consistency, Puree. Jeremy's diet consistency was modified to this by this SLP d/t the Advanced Cognitive decline/Dementia impacting the Oral phase of swallowing (increased oral phase time, pocketing had been noted by NSG previously). Jeremy appears to present w/ oropharyngeal phase dysphagia in light of declined Cognitive status; advanced Dementia. Jargon speech w/ limited verbal output noted w/ poor insight into his situation. This can impact his overall awareness/timing of swallow and safety during po tasks which increases risk for aspiration, choking. Jeremy's risk for aspiration is present but can be reduced when following general aspiration precautions, feeding support d/t the Confusion, and when using a modified diet consistency w/ Supervision w/ po's. Jeremy required MOD verbal/tactile/visual cues during po tasks for orinetation to the boluses; oral prep phase d/t Baseline Confusion.      Jeremy consumed several trials of ice chips, purees, and Thin liquids w/ NO immediate, overt clinical s/s of aspiration noted. No decline in vocal quality; no cough, and no decline in respiratory status during/post trials. Oral phase was adequate for bolus management and oral clearing of the boluses given. Soft solids were not attempted d/t Jeremy's Cognitive decline and Poor Dentition status Baseline. Jeremy required MAX support and guidance for self-feeding attempts d/t Cognitive decline. Was able to hold own Cup during drinking which improves safety of swallowing. Some confusion during OM tasks and oral  care.          D/t Jeremy's Baseline, declined Cognitive status and risk for aspiration, recommend to continue now and at D/C a Dysphagia level 1(Puree diet) w/ Thin liquids; general aspiration precautions; reduce Distractions during meals and engage Jeremy during po's at meal for self-feeding. Feeding Support and Supervision. Pills Crushed vs Whole in Puree for safer swallowing. MD/NSG updated.   ST services recommends follow w/ Palliative Care for [place] and education re: impact of Cognitive decline/Dementia on swallowing, oral intake. ST services can follow Jeremy at discharge for further education IF needed -- largely suspect that Jeremy's Dementia could hamper upgrade of diet and that Jeremy benefits from this diet consistency for overall Safety and for adequate oral intake to meet needs. Recommend Dietician f/u. Precautions posted in room.     HPI HPI: Jeremy Brooke Bonito. is a 75 y.o. male with PMH significant for advanced Dementia with behavioral disturbance and amyloid angiopathy, seizure disorder, sinus bradycardia, hypertension, essential tremor, BPH.  Patient presented to the ED from [facility]  family care group home for evaluation of altered mental status and generalized weakness.  He was recently admitted from 09/16/2020-09/19/2020 for acute metabolic encephalopathy suspected due to dehydration.  All the work-up was unremarkable and patient improved, discharged back to group home on 7/6.  On 7/9, patient was apparently found laying on his back on the floor, unarousable.  EMS was called and patient was brought to the ED .  During this admit, s/s of oral dysphagia noted by Jeremy Christensen staff; diet downgraded (food consistency downgraded).      SLP Plan  All goals met       Recommendations  Diet recommendations: Dysphagia 1 (puree);Thin liquid Liquids provided via: Cup;Straw (monitor) Medication Administration: Crushed with puree (for  safer swallowing overall) Supervision: Staff to assist with self feeding;Full supervision/cueing for  compensatory strategies Compensations: Minimize environmental distractions;Slow rate;Small sips/bites;Follow solids with liquid;Lingual sweep for clearance of pocketing Postural Changes and/or Swallow Maneuvers: Out of bed for meals;Seated upright 90 degrees;Upright 30-60 min after meal                General recommendations:  (Dietician f/u; Palliative Care f/u) Oral Care Recommendations: Oral care BID;Staff/trained caregiver to provide oral care Follow up Recommendations: None SLP Visit Diagnosis: Dysphagia, oral phase (R13.11) (Cognitive decline/Dementia Baseline) Plan: All goals met       GO                 Orinda Kenner, MS, CCC-SLP Speech Language Pathologist Rehab Services [phone] [facility] 09/28/2020, 4:20 PM

## 2020-09-28 NOTE — Progress Notes (Signed)
   Daily Progress Note   Patient Name: Jeremy Christensen.       Date: 09/28/2020 DOB: 1945-04-22  Age: 75 y.o. MRN#: [id number] Attending Physician: Lorin Glass, MD Primary Care Physician: Preston Fleeting, MD Admit Date: 09/22/2020  Reason for Consultation/Follow-up: Establishing goals of care  Subjective: Chart Reviewed. Updates Received. Patient Assessed.   No acute distress noted. Patient resting. Mittens in place. No agitation noted. Per reports appetite continues to improve. Confusion noted in the setting of dementia/encephalopathy.   I attempted to reach legal guardian, Milinda Pointer for follow-up. Unable to reach. During previous discussions recommendations for DNR/DNI and completion of MOST encouraged. She verbalized understanding with plans to further discuss with her leadership and complete as necessary.  Mrs. Murrow-Jennings confirmed plans for SNF with plans to transition to long-term care.   Requested documentation with recommendations have been faxed to given number: [phone]   Length of Stay: 5 days  Vital Signs: BP 107/66 (BP Location: Left Arm)   Pulse (!) 52   Temp 98.7 F (37.1 C)   Resp 16   Ht 5\' 8"  (1.727 m)   Wt 96.9 kg   SpO2 96%   BMI 32.48 kg/m  SpO2: SpO2: 96 % O2 Device: O2 Device: Room Air O2 Flow Rate:    Physical Exam: NAD, resting but easily aroused, pleasant RRR CTA dementia             Palliative Care Assessment & Plan  HPI: Palliative Care consult requested for goals of care discussion in this 75 y.o. male  with past medical history of seizure disorder, sinus bradycardia, hypertension, BPH, tremors, advanced dementia, and amyloid angiopathy. He was admitted on 09/22/2020 from [place] group home with complaints of altered mental status and generalized weakness. Patient was recently admitted (7/3-09/19/2020) for acute metabolic encephalopathy due to dehydration. On arrival was somnolent after faciluty found him lying on his back.  During work-up chest x-ray showed vascular congestion. CT of head negative for acute abnormalities. Small vessel white matter disease noted. EEG negative for epilpetiform but did show moderate diffuse encephalopathy of nonspecific etiology. He is being followed by speech with recommendations for pureed diet, nectar thick.   Code Status: Full code  Goals of Care/Recommendations: Continue to treat the treatable Per Legal Guardian plans for SNF-LTC placement.  Recommendations for outpatient Palliative support.  Documentation has been faxed to guardian with recommendations to complete MOST form and for DNR.  PMT will continue to support and follow as needed. No weekend coverage. I will follow-up on Monday if still here.   Prognosis: Guarded   Discharge Planning: Skilled Nursing Facility for rehab with Palliative care service follow-up  Thank you for allowing the Palliative Medicine Team to assist in the care of this patient.  Time Total: 25 min.   Visit consisted of counseling and education dealing with the complex and emotionally intense issues of symptom management and palliative care in the setting of serious and potentially life-threatening illness.Greater than 50%  of this time was spent counseling and coordinating care related to the above assessment and plan.  Tuesday, AGPCNP-BC  Palliative Medicine Team [phone]

## 2020-09-28 NOTE — Progress Notes (Signed)
PROGRESS NOTE  Jeremy Christensen.  DOB: 1945/11/08  PCP: Preston Fleeting, MD OZH:[id number]  DOA: 09/22/2020  LOS: 5 days  Hospital Day: 7   Chief Complaint  Patient presents with   Weakness   Brief narrative: Jeremy Christensen. is a 75 y.o. male with PMH significant for advanced dementia with behavioral disturbance and amyloid angiopathy, seizure disorder, sinus bradycardia, hypertension, essential tremor, BPH. Patient presented to the ED from [facility]  family care group home for evaluation of altered mental status and generalized weakness.  He was recently admitted from 09/16/2020-09/19/2020 for acute metabolic encephalopathy suspected due to dehydration.  All the work-up was unremarkable and patient improved, discharged back to group home on 7/6. On 7/9, patient was apparently found laying on his back on the floor, unarousable.  EMS was called and patient was brought to the ED .  In the ED, patient was afebrile, hemodynamically stable Labs unremarkable Chest x-ray did not show any consolidation, edema or effusion CT scan of head did not show any intracranial pathology.   Patient was admitted to hospitalist service for further evaluation management See below for details.  Subjective: Patient was seen and examined this morning.   Alert, awake, calm.  Has mittens on.  Not restless or agitated today  Assessment/Plan: Acute metabolic encephalopathy -Unclear etiology.  No evidence of infection.  Recently admitted for similar presentation.  No clear triggers were identified at that time.  He was thought to be dehydrated and given adequate hydration. -EEG did not show any epileptiform discharges but showed moderate diffuse encephalopathy of nonspecific etiology. -His Ativan and trazodone are on hold.  I will resume them as needed because of his intermittent episodes of restlessness -He has a sitter in place.  Hoping with resumption of Ativan, he would be less restless and  remained able to DC sitter.  Dysphagia -Continue aspiration precaution fall precautions -Pured diet, nectar thick liquid -Speech and swallow evaluation   Seizure disorder: -Home meds include Keppra 1000 mg a.m. and 1500 mg p.m., Lamictal 150 mg twice daily -Continue the same. -EEG without any specific epileptiform discharges -Patient has good oral intake now. Can switch IV meds to oral.  Sinus bradycardia -Chronic and appears stable with rates between 40-60 on admission.   Hypertension: -amlodipine and lisinopril on hold because of soft blood pressure.  Blood pressure remained stable for the last several readings in low normal range.   Advanced dementia with behavioral changes -Continue home donepezil, Abilify, amantadine. -As mentioned above, trazodone and Ativan resumed today as as needed.   BPH Continue Flomax.  Mobility: Encourage ambulation when able to follow commands Code Status:   Code Status: Full Code  Nutritional status: Body mass index is 32.48 kg/m.     Diet:  Diet Order             DIET - DYS 1 Room service appropriate? Yes with Assist; Fluid consistency: Thin  Diet effective now                  DVT prophylaxis: Lovenox subcu    Antimicrobials: None Fluid: None Consultants: None Family Communication: None at bedside  Status is: Inpatient  Remains inpatient appropriate because: Mental status gradually improving.  Dispo: The patient is from: Group home              Anticipated d/c is to: Stable to discharge to SNF.  Patient currently is medically stable to d/c.   Difficult to place patient No  Infusions:     Scheduled Meds:  amantadine  50 mg Oral BID   ARIPiprazole  20 mg Oral QHS   donepezil  10 mg Oral QHS   enoxaparin (LOVENOX) injection  0.5 mg/kg Subcutaneous Q24H   lamoTRIgine  150 mg Oral BID   levETIRAcetam  1,000 mg Oral Daily   And   levETIRAcetam  1,500 mg Oral QHS   sodium chloride flush  3 mL  Intravenous Q12H   tamsulosin  0.4 mg Oral QHS    Antimicrobials: Anti-infectives (From admission, onward)    None       PRN meds: acetaminophen **OR** acetaminophen, hydrALAZINE, ondansetron **OR** ondansetron (ZOFRAN) IV   Objective: Vitals:   09/27/20 2316 09/28/20 0430  BP: 118/64 106/78  Pulse: (!) 51 (!) 55  Resp: 18 17  Temp: 98.2 F (36.8 C) 98 F (36.7 C)  SpO2: 100% 98%    Intake/Output Summary (Last 24 hours) at 09/28/2020 0934 Last data filed at 09/28/2020 0900 Gross per 24 hour  Intake 720 ml  Output --  Net 720 ml    Filed Weights   09/22/20 1723  Weight: 96.9 kg   Weight change:  Body mass index is 32.48 kg/m.   Physical Exam: General exam: Pleasant, elderly Caucasian male.  Not in physical distress Skin: No rashes, lesions or ulcers. HEENT: Atraumatic, normocephalic, no obvious bleeding Lungs: Clear to auscultate bilaterally CVS: Regular rate and rhythm, no murmur GI/Abd soft, nontender, nondistended, bowel sound present CNS: Has mittens in hand.  Alert, awake, verbalizes few words but unable to answer orientation questions.   Psychiatry:.  Depressed look Extremities: No pedal edema, no calf tenderness  Data Review: I have personally reviewed the laboratory data and studies available.  Recent Labs  Lab 09/22/20 1728 09/23/20 0628 09/24/20 0422 09/25/20 0509 09/26/20 0502 09/27/20 0508 09/28/20 0442  WBC 8.4   < > 6.2 6.2 6.9 5.8 5.6  NEUTROABS 6.5  --   --   --   --   --   --   HGB 14.6   < > 13.4 13.8 13.5 14.1 13.7  HCT 41.7   < > 40.0 41.8 39.8 41.8 41.0  MCV 95.2   < > 96.4 96.1 97.1 96.8 98.3  PLT 181   < > 185 199 193 212 219   < > = values in this interval not displayed.    Recent Labs  Lab 09/23/20 0628 09/24/20 0422 09/25/20 0509 09/26/20 0502 09/27/20 0508 09/28/20 0442  NA 140 141 139 139 138 137  K 3.4* 3.6 3.8 4.0 3.9 4.1  CL 108 109 107 106 105 104  CO2 26 25 25 25 25 27   GLUCOSE 87 95 92 96 91 92  BUN  21 18 16 20 17  24*  CREATININE 1.12 1.05 1.04 1.04 1.06 1.24  CALCIUM 8.7* 8.3* 8.6* 8.7* 8.7* 8.4*  MG 2.1 1.9 2.0 2.0  --   --   PHOS 2.2* 2.2* 2.7 2.6  --   --      F/u labs ordered Unresulted Labs (From admission, onward)    None       Signed, , MD [facility] 09/28/2020

## 2020-09-28 NOTE — Care Management Important Message (Signed)
Important Message  Patient Details  Name: Jeremy Christensen. MRN: [id number] Date of Birth: 12/16/1945   Medicare Important Message Given:  Other (see comment)  Called Legal Guardian, Myriam Forehand at [facility] [employer] [phone] to obtain a fax number to send a copy of the Important Message from Medicare for her to review for this pt. Received  a message each time that the mailbox was full.  I try again.   Olegario Messier A Dheeraj Hail 09/28/2020, 12:55 PM

## 2020-09-29 DIAGNOSIS — R4182 Altered mental status, unspecified: Secondary | ICD-10-CM | POA: Diagnosis not present

## 2020-09-29 NOTE — Progress Notes (Signed)
Pt  removedmultiple IV and per MD Dahal okay to not placed IV at this time. Will continue to monitor.

## 2020-09-29 NOTE — Progress Notes (Signed)
PROGRESS NOTE  Jeremy Christensen.  DOB: Apr 19, 1945  PCP: Preston Fleeting, MD WUJ:[id number]  DOA: 09/22/2020  LOS: 6 days  Hospital Day: 8   Chief Complaint  Patient presents with   Weakness   Brief narrative: Jeremy Christensen [name]. is a 75 y.o. male with PMH significant for advanced dementia with behavioral disturbance and amyloid angiopathy, seizure disorder, sinus bradycardia, hypertension, essential tremor, BPH. Patient presented to the ED from [facility]  family care group home for evaluation of altered mental status and generalized weakness.  He was recently admitted from 09/16/2020-09/19/2020 for acute metabolic encephalopathy suspected due to dehydration.  All the work-up was unremarkable and patient improved, discharged back to group home on 7/6. On 7/9, patient was apparently found laying on his back on the floor, unarousable.  EMS was called and patient was brought to the ED .  In the ED, patient was afebrile, hemodynamically stable Labs unremarkable Chest x-ray did not show any consolidation, edema or effusion CT scan of head did not show any intracranial pathology.   Patient was admitted to hospitalist service for further evaluation management See below for details.  Subjective: Patient was seen and examined this morning.   Elderly Caucasian male.  Lying on bed.  Not in distress.  Not restless or agitated.  Sitter at bedside.  Assessment/Plan: Acute metabolic encephalopathy -Unclear etiology.  No evidence of infection.  Recently admitted for similar presentation.  No clear triggers were identified at that time.  He was thought to be dehydrated and given adequate hydration. -EEG did not show any epileptiform discharges but showed moderate diffuse encephalopathy of nonspecific etiology. -His mental status has been gradually improving.  I resumed his Ativan and trazodone last night for last 24 hours, he has not had any episode of restlessness, agitation.  Okay to [employer] today.    Dysphagia -Continue aspiration precaution fall precautions -Pured diet, nectar thick liquid -Speech and swallow evaluation to continue.   Seizure disorder: -Home meds include Keppra 1000 mg a.m. and 1500 mg p.m., Lamictal 150 mg twice daily -Continue the same. -EEG without any specific epileptiform discharges -Patient has good oral intake now.   Sinus bradycardia -Chronic and appears stable with rates between 40-60 on admission.   Hypertension: -amlodipine and lisinopril on hold because of soft blood pressure.  Blood pressure mostly stable under 140.  Continue to monitor.   Advanced dementia with behavioral changes -Continue home donepezil, Abilify, amantadine, trazodone, Ativan.   BPH Continue Flomax.  Mobility: Encourage ambulation when able to follow commands Code Status:   Code Status: Full Code  Nutritional status: Body mass index is 32.48 kg/m.     Diet:  Diet Order             DIET - DYS 1 Room service appropriate? Yes with Assist; Fluid consistency: Thin  Diet effective now                  DVT prophylaxis: Lovenox subcu    Antimicrobials: None Fluid: None Consultants: None Family Communication: None at bedside  Status is: Inpatient  Remains inpatient appropriate because: Mental status gradually improving.  Dispo: The patient is from: Group home              Anticipated d/c is to: Stable to discharge to SNF.                Patient currently is medically stable to d/c.   Difficult to place patient No  Infusions:     Scheduled Meds:  amantadine  50 mg Oral BID   ARIPiprazole  20 mg Oral QHS   donepezil  10 mg Oral QHS   enoxaparin (LOVENOX) injection  0.5 mg/kg Subcutaneous Q24H   lamoTRIgine  150 mg Oral BID   levETIRAcetam  1,000 mg Oral Daily   And   levETIRAcetam  1,500 mg Oral QHS   sodium chloride flush  3 mL Intravenous Q12H   tamsulosin  0.4 mg Oral QHS    Antimicrobials: Anti-infectives (From admission,  onward)    None       PRN meds: acetaminophen **OR** acetaminophen, hydrALAZINE, LORazepam, ondansetron **OR** ondansetron (ZOFRAN) IV, traZODone   Objective: Vitals:   09/29/20 0555 09/29/20 0846  BP: (!) 154/84 137/71  Pulse: 60 (!) 53  Resp: 18 16  Temp: 97.9 F (36.6 C) 98.6 F (37 C)  SpO2: 100% 94%    Intake/Output Summary (Last 24 hours) at 09/29/2020 1100 Last data filed at 09/29/2020 1004 Gross per 24 hour  Intake 300 ml  Output 1050 ml  Net -750 ml    Filed Weights   09/22/20 1723  Weight: 96.9 kg   Weight change:  Body mass index is 32.48 kg/m.   Physical Exam: General exam: Pleasant, elderly Caucasian male.  Not in physical distress Skin: No rashes, lesions or ulcers. HEENT: Atraumatic, normocephalic, no obvious bleeding Lungs: Clear to auscultation bilaterally CVS: Regular rate and rhythm, no murmur GI/Abd soft, nontender, nondistended, bowel sound present CNS: Has mittens in hand.  Alert, awake, verbalizes few words but unable to answer orientation questions.   Psychiatry:.  Depressed look Extremities: No pedal edema, no calf tenderness  Data Review: I have personally reviewed the laboratory data and studies available.  Recent Labs  Lab 09/22/20 1728 09/23/20 0628 09/24/20 0422 09/25/20 0509 09/26/20 0502 09/27/20 0508 09/28/20 0442  WBC 8.4   < > 6.2 6.2 6.9 5.8 5.6  NEUTROABS 6.5  --   --   --   --   --   --   HGB 14.6   < > 13.4 13.8 13.5 14.1 13.7  HCT 41.7   < > 40.0 41.8 39.8 41.8 41.0  MCV 95.2   < > 96.4 96.1 97.1 96.8 98.3  PLT 181   < > 185 199 193 212 219   < > = values in this interval not displayed.    Recent Labs  Lab 09/23/20 0628 09/24/20 0422 09/25/20 0509 09/26/20 0502 09/27/20 0508 09/28/20 0442  NA 140 141 139 139 138 137  K 3.4* 3.6 3.8 4.0 3.9 4.1  CL 108 109 107 106 105 104  CO2 26 25 25 25 25 27   GLUCOSE 87 95 92 96 91 92  BUN 21 18 16 20 17  24*  CREATININE 1.12 1.05 1.04 1.04 1.06 1.24  CALCIUM  8.7* 8.3* 8.6* 8.7* 8.7* 8.4*  MG 2.1 1.9 2.0 2.0  --   --   PHOS 2.2* 2.2* 2.7 2.6  --   --      F/u labs ordered Unresulted Labs (From admission, onward)    None       Signed, , MD [facility] 09/29/2020

## 2020-09-30 DIAGNOSIS — R4182 Altered mental status, unspecified: Secondary | ICD-10-CM | POA: Diagnosis not present

## 2020-09-30 NOTE — Plan of Care (Signed)
?  Problem: Coping: ?Goal: Level of anxiety will decrease ?Outcome: Progressing ?  ?Problem: Safety: ?Goal: Ability to remain free from injury will improve ?Outcome: Progressing ?  ?

## 2020-09-30 NOTE — Progress Notes (Signed)
PROGRESS NOTE  Jeremy Christensen.  DOB: [birth year]  PCP: Preston Fleeting, MD ION:[id number]  DOA: 09/22/2020  LOS: 7 days  Hospital Day: 9   Chief Complaint  Patient presents with   Weakness   Brief narrative: Jeremy Humphreys Roe Wilner. is a 75 y.o. male with PMH significant for advanced dementia with behavioral disturbance and amyloid angiopathy, seizure disorder, sinus bradycardia, hypertension, essential tremor, BPH. Patient presented to the ED from [facility]  family care group home for evaluation of altered mental status and generalized weakness.  He was recently admitted from 09/16/2020-09/19/2020 for acute metabolic encephalopathy suspected due to dehydration.  All the work-up was unremarkable and patient improved, discharged back to group home on 7/6. On 7/9, patient was apparently found laying on his back on the floor, unarousable.  EMS was called and patient was brought to the ED .  In the ED, patient was afebrile, hemodynamically stable Labs unremarkable Chest x-ray did not show any consolidation, edema or effusion CT scan of head did not show any intracranial pathology.   Patient was admitted to hospitalist service for further evaluation management See below for details.  Subjective: Patient was seen and examined this morning.   Lying on bed.  Has mittens in both hands.  Not talking this morning.  Seems depressed. Later this morning, he tried to crawl out of bed and slumped to the floor.  Did not hit his head.  No loss of consciousness.  Assessment/Plan: Acute metabolic encephalopathy History of advanced dementia with behavioral changes -Unclear etiology.  No evidence of infection.  Recently admitted for similar presentation.  No clear triggers were identified at that time.  He was thought to be dehydrated and given adequate hydration. -EEG did not show any epileptiform discharges but showed moderate diffuse encephalopathy of nonspecific etiology. -In last several  days, his mental status has been gradually improving and stable.   -Continue home donepezil, Abilify, amantadine, trazodone, Ativan.  Dysphagia -Continue aspiration precaution fall precautions -Pured diet, nectar thick liquid -Speech and swallow evaluation to continue.   Seizure disorder: -Home meds include Keppra 1000 mg a.m. and 1500 mg p.m., Lamictal 150 mg twice daily -Continue the same. -EEG without any specific epileptiform discharges -Patient has good oral intake now.   Sinus bradycardia -Chronic and appears stable with rates between 40-60 on admission.   Hypertension: -amlodipine and lisinopril on hold because of soft blood pressure.  Blood pressure mostly stable under 140.  Continue to monitor.   BPH Continue Flomax.  Mobility: Encourage ambulation when able to follow commands Code Status:   Code Status: Full Code  Nutritional status: Body mass index is 32.48 kg/m.     Diet:  Diet Order             DIET - DYS 1 Room service appropriate? Yes with Assist; Fluid consistency: Thin  Diet effective now                  DVT prophylaxis: Lovenox subcu    Antimicrobials: None Fluid: None Consultants: None Family Communication: None at bedside  Status is: Inpatient  Remains inpatient appropriate because: Mental status gradually improving.  Dispo: The patient is from: Group home              Anticipated d/c is to: Stable to discharge to SNF.                Patient currently is medically stable to d/c.   Difficult to place patient No  Infusions:     Scheduled Meds:  amantadine  50 mg Oral BID   ARIPiprazole  20 mg Oral QHS   donepezil  10 mg Oral QHS   enoxaparin (LOVENOX) injection  0.5 mg/kg Subcutaneous Q24H   lamoTRIgine  150 mg Oral BID   levETIRAcetam  1,000 mg Oral Daily   And   levETIRAcetam  1,500 mg Oral QHS   sodium chloride flush  3 mL Intravenous Q12H   tamsulosin  0.4 mg Oral QHS    Antimicrobials: Anti-infectives (From  admission, onward)    None       PRN meds: acetaminophen **OR** acetaminophen, hydrALAZINE, LORazepam, ondansetron **OR** ondansetron (ZOFRAN) IV, traZODone   Objective: Vitals:   09/30/20 0646 09/30/20 0804  BP:  (!) 142/93  Pulse:  (!) 52  Resp:  16  Temp: 97.9 F (36.6 C) 97.9 F (36.6 C)  SpO2:      Intake/Output Summary (Last 24 hours) at 09/30/2020 0937 Last data filed at 09/30/2020 0658 Gross per 24 hour  Intake 540 ml  Output 550 ml  Net -10 ml    Filed Weights   09/22/20 1723  Weight: 96.9 kg   Weight change:  Body mass index is 32.48 kg/m.   Physical Exam: General exam: Pleasant, elderly Caucasian male.  Not in physical distress Skin: No rashes, lesions or ulcers. HEENT: Atraumatic, normocephalic, no obvious bleeding Lungs: Clear to auscultation bilaterally CVS: Regular rate and rhythm, no murmur GI/Abd soft, nontender, nondistended, bowel sound present CNS: Has mittens in hand.  Alert, awake, talking less today.   Psychiatry: Depressed look Extremities: No pedal edema, no calf tenderness  Data Review: I have personally reviewed the laboratory data and studies available.  Recent Labs  Lab 09/24/20 0422 09/25/20 0509 09/26/20 0502 09/27/20 0508 09/28/20 0442  WBC 6.2 6.2 6.9 5.8 5.6  HGB 13.4 13.8 13.5 14.1 13.7  HCT 40.0 41.8 39.8 41.8 41.0  MCV 96.4 96.1 97.1 96.8 98.3  PLT 185 199 193 212 219    Recent Labs  Lab 09/24/20 0422 09/25/20 0509 09/26/20 0502 09/27/20 0508 09/28/20 0442  NA 141 139 139 138 137  K 3.6 3.8 4.0 3.9 4.1  CL 109 107 106 105 104  CO2 25 25 25 25 27   GLUCOSE 95 92 96 91 92  BUN 18 16 20 17  24*  CREATININE 1.05 1.04 1.04 1.06 1.24  CALCIUM 8.3* 8.6* 8.7* 8.7* 8.4*  MG 1.9 2.0 2.0  --   --   PHOS 2.2* 2.7 2.6  --   --      F/u labs ordered Unresulted Labs (From admission, onward)    None       Signed, , MD [facility] 09/30/2020

## 2020-09-30 NOTE — Progress Notes (Signed)
   09/30/20 1000  What Happened  Was fall witnessed? No  Was patient injured? Yes  Patient found on floor  Found by Staff-comment  Stated prior activity ambulating-unassisted  Follow Up  MD notified Lorin Glass  Time MD notified [phone]  Additional tests No  Simple treatment Dressing  Adult Fall Risk Assessment  Risk Factor Category (scoring not indicated) Fall has occurred during this admission (document High fall risk)  Patient Fall Risk Level High fall risk  Adult Fall Risk Interventions  Required Bundle Interventions *See Row Information* High fall risk - low, moderate, and high requirements implemented  Additional Interventions Use of appropriate toileting equipment (bedpan, BSC, etc.);Room near nurses station  Screening for Fall Injury Risk (To be completed on HIGH fall risk patients) - Assessing Need for Floor Mats  Risk For Fall Injury- Criteria for Floor Mats Confusion/dementia (+NuDESC, CIWA, TBI, etc.);Previous fall this admission  Will Implement Floor Mats Yes  Vitals  Temp 98.6 F (37 C)  Temp Source Oral  BP 134/75  MAP (mmHg) 91  BP Location Right Arm  BP Method Automatic  Patient Position (if appropriate) Lying  Pulse Rate 69  Pulse Rate Source Monitor  Resp 18  Oxygen Therapy  SpO2 92 %  O2 Device Room Air  Pain Assessment  Pain Scale PAINAD  PAINAD (Pain Assessment in Advanced Dementia)  Breathing 0  Negative Vocalization 0  Facial Expression 1  Body Language 0  Consolability 0  PAINAD Score 1  PCA/Epidural/Spinal Assessment  Respiratory Pattern Regular;Unlabored  Neurological  Neuro (WDL) X  Level of Consciousness Alert  Orientation Level Oriented to person;Disoriented to place;Disoriented to time;Disoriented to situation  Cognition Memory impairment;Poor judgement;Poor safety awareness;Poor attention/concentration;Impulsive  Speech Delayed responses  Neuro Symptoms Fatigue  Glasgow Coma Scale  Eye Opening 4  Best Verbal Response  (NON-intubated) 3  Best Motor Response 6  Glasgow Coma Scale Score 13  NIH Stroke Scale ( + Modified Stroke Scale Criteria)   Interval Initial  Level of Consciousness (1a.)    0  LOC Questions (1b. )   + 2  LOC Commands (1c. )   +  0  Facial Palsy (4. )     0  Motor Arm, Left (5a. )   + 0  Motor Arm, Right (5b. )   + 0  Motor Leg, Left (6a. )   + 0  Motor Leg, Right (6b. )   + 0  Limb Ataxia (7. ) 0  Best Language (9. )   + 1  Dysarthria (10. ) 1  Extinction/Inattention (11.)   + 0  Musculoskeletal  Musculoskeletal (WDL) X  Assistive Device None  Generalized Weakness Yes  Weight Bearing Restrictions No  Integumentary  Integumentary (WDL) X  Skin Color Appropriate for ethnicity  Skin Condition Dry  Skin Integrity Abrasion;Ecchymosis  Abrasion Location Arm;Leg;Back  Abrasion Location Orientation Bilateral  Abrasion Intervention Other (Comment) (assessed)  Rash Location Groin  Rash Location Orientation Bilateral  Skin Turgor Non-tenting

## 2020-09-30 NOTE — Progress Notes (Signed)
Physical Therapy Treatment Patient Details Name: Jeremy Christensen. MRN: [id number] DOB: 1946/03/03 Today's Date: 09/30/2020    History of Present Illness 75 y.o. male with PMH significant for advanced dementia with behavioral disturbance and amyloid angiopathy, seizure disorder, sinus bradycardia, HTN, essential tremor, BPH. Pt presented to the ED from group home for evaluation of altered mental status and generalized weakness.  He was recently admitted from 09/16/2020-09/19/2020 for acute metabolic encephalopathy suspected due to dehydration.  All the work-up was unremarkable and patient improved, discharged back to group home on 7/6.  On 7/9, patient was apparently found laying on his back on the floor, unarousable.  EMS was called and patient was brought to the ED .    PT Comments    Pt awake in bed, sidelying propped on left elbow.  Chair readied and gown reapplied and he has taken it off.  He is encouraged to sit EOB for mobility but does not follow any verbal or tactile cues today with no attempt at verbal responses.  Despite mod tactile and verbal cues he does not make any progression in movement and further mobility with +1 assist is deemed unsafe at this time.  Will continue at a later time/date with mobility when pt is more receptive to mobility or additional assist is available.   Follow Up Recommendations  SNF;Supervision/Assistance - 24 hour     Equipment Recommendations  None recommended by PT;Other (comment)    Recommendations for Other Services       Precautions / Restrictions Precautions Precautions: Fall Restrictions Weight Bearing Restrictions: No    Mobility  Bed Mobility                    Transfers                    Ambulation/Gait                 Stairs             Wheelchair Mobility    Modified Rankin (Stroke Patients Only)       Balance                                            Cognition  Arousal/Alertness: Lethargic Behavior During Therapy: Flat affect Overall Cognitive Status: No family/caregiver present to determine baseline cognitive functioning                                 General Comments: unable to follow verbal and tactile cues this morning with no verbal attempts.      Exercises      General Comments        Pertinent Vitals/Pain Pain Assessment: Faces Faces Pain Scale: No hurt    Home Living                      Prior Function            PT Goals (current goals can now be found in the care plan section) Progress towards PT goals: Not progressing toward goals - comment    Frequency    Min 2X/week      PT Plan Current plan remains appropriate    Co-evaluation  AM-PAC PT "6 Clicks" Mobility   Outcome Measure  Help needed turning from your back to your side while in a flat bed without using bedrails?: A Lot Help needed moving from lying on your back to sitting on the side of a flat bed without using bedrails?: A Lot Help needed moving to and from a bed to a chair (including a wheelchair)?: A Lot Help needed standing up from a chair using your arms (e.g., wheelchair or bedside chair)?: A Lot Help needed to walk in hospital room?: A Lot Help needed climbing 3-5 steps with a railing? : A Lot 6 Click Score: 12    End of Session   Activity Tolerance: Patient limited by lethargy;Other (comment) Patient left: in bed;with call bell/phone within reach;with bed alarm set Nurse Communication: Mobility status PT Visit Diagnosis: Muscle weakness (generalized) (M62.81);Difficulty in walking, not elsewhere classified (R26.2);History of falling (Z91.81)     Time: 4734-0370 PT Time Calculation (min) (ACUTE ONLY): 9 min  Charges:  $Therapeutic Activity: 8-22 mins                    Danielle Dess, PTA 09/30/20, 9:42 AM , 9:40 AM

## 2020-10-01 DIAGNOSIS — R4182 Altered mental status, unspecified: Secondary | ICD-10-CM | POA: Diagnosis not present

## 2020-10-01 LAB — RESP PANEL BY RT-PCR (FLU A&B, COVID) ARPGX2
Influenza A by PCR: NEGATIVE
Influenza B by PCR: NEGATIVE
SARS Coronavirus 2 by RT PCR: NEGATIVE

## 2020-10-01 MED ORDER — TRAZODONE HCL 100 MG PO TABS: 100.0000 mg | ORAL_TABLET | Freq: Every day | ORAL | 0 refills | Status: AC

## 2020-10-01 MED ORDER — LORAZEPAM 1 MG PO TABS: 1.0000 mg | ORAL_TABLET | Freq: Two times a day (BID) | ORAL | 0 refills | Status: AC | PRN

## 2020-10-01 NOTE — TOC Progression Note (Signed)
Transition of Care [facility]) - Progression Note    Patient Details  Name: Jeremy Christensen. MRN: [id number] Date of Birth: 1945-11-08  [facility] of [facility]) CM/SW Contact  Hetty Ely, RN Phone Number: 10/01/2020, 12:31 PM  Clinical Narrative: Several phone attempts to [facility] to arrange discharge no answer no voice mail. [place], [phone] Admissions, she will review chart and reply with decisions.      Expected Discharge Plan: Skilled Nursing Facility Barriers to Discharge: SNF Pending bed offer  Expected Discharge Plan and Services Expected Discharge Plan: Skilled Nursing Facility   Discharge Planning Services: CM Consult Post Acute Care Choice: Skilled Nursing Facility Living arrangements for the past 2 months: Group Home Expected Discharge Date: 10/01/20               DME Arranged: N/A DME Agency: NA       HH Arranged: NA           Social Determinants of Health (SDOH) Interventions    Readmission Risk Interventions No flowsheet data found.

## 2020-10-01 NOTE — Progress Notes (Signed)
Occupational Therapy Treatment Patient Details Name: Jeremy Christensen. MRN: [id number] DOB: June 09, 1945 Today's Date: 10/01/2020    History of present illness 75 y.o. male with PMH significant for advanced dementia with behavioral disturbance and amyloid angiopathy, seizure disorder, sinus bradycardia, HTN, essential tremor, BPH. Pt presented to the ED from group home for evaluation of altered mental status and generalized weakness.  He was recently admitted from 09/16/2020-09/19/2020 for acute metabolic encephalopathy suspected due to dehydration.  All the work-up was unremarkable and patient improved, discharged back to group home on 7/6.  On 7/9, patient was apparently found laying on his back on the floor, unarousable.  EMS was called and patient was brought to the ED .   OT comments  Pt seen for OT tx this date. Pt wakes easily to verbal cues, engages in simple "chit chat" conversation with increased response time. Agreeable to EOB for grooming tasks. Required hand over hand assist for RUE to reach across his body to bed rail to assist in supine>sit EOB requiring MOD A to complete. Once seated EOB pt required intermittent CGA-MIN A for static sitting balance. Pt required set up and was able to initiate task with simple repeated verbal cue to wash face, comb hair but required hand over hand MOD A to complete. Pt required MOD-MAX A to rub lotion into skin on the back of his hands. Pt performed partial STS/lateral scoot with MOD A handheld assist to complete for improved positioning EOB prior to return to supine requiring MOD A. Pt quickly fell asleep once in ideal positioning in bed. Mitts replaced and pt provided with warm blanket for comfort. Pt continues to benefit from skilled OT services. Currently not at baseline independence or cognition requiring increased assist for ADL/mobility. Continue to recommend SNF for short term rehab in order to maximize return to PLOF.    Follow Up Recommendations   SNF;Supervision/Assistance - 24 hour    Equipment Recommendations  3 in 1 bedside commode    Recommendations for Other Services      Precautions / Restrictions Precautions Precautions: Fall Restrictions Weight Bearing Restrictions: No       Mobility Bed Mobility Overal bed mobility: Needs Assistance Bed Mobility: Supine to Sit;Sit to Supine     Supine to sit: Mod assist;HOB elevated Sit to supine: Mod assist   General bed mobility comments: MOD A with simple repeated cues for sequencing, hand over hand to reach for bed rail    Transfers Overall transfer level: Needs assistance Equipment used: 1 person hand held assist Transfers: Lateral/Scoot Transfers          Lateral/Scoot Transfers: Mod assist;From elevated surface General transfer comment: Performed lateral scoot with MOD A to complete    Balance Overall balance assessment: Needs assistance Sitting-balance support: Single extremity supported;Feet supported;No upper extremity supported;Feet unsupported Sitting balance-Leahy Scale: Fair Sitting balance - Comments: poor+ to fair- with intermittent CGA to MIN A to correct for sitting balance   Standing balance support: Bilateral upper extremity supported;During functional activity Standing balance-Leahy Scale: Poor                             ADL either performed or assessed with clinical judgement   ADL Overall ADL's : Needs assistance/impaired     Grooming: Wash/dry hands;Wash/dry face;Brushing hair;Sitting;Minimal assistance;Cueing for sequencing;Moderate assistance Grooming Details (indicate cue type and reason): While seated EOB, pt participated in combing his hair, washing his face and hands, and  applying lotion to the backs of his hands with MIN A to MOD A with hand over hand assist to complete, cues to sequence/initiate                                     Vision       Perception     Praxis      Cognition  Arousal/Alertness: Awake/alert Behavior During Therapy: Flat affect Overall Cognitive Status: History of cognitive impairments - at baseline                                 General Comments: Pt alerted easily with verbal cues, with short simple repeated commands, pt able to follow with assist and able to respond yes/no to a few ADL tasks. Falls back to sleep quickly once returned to supine        Exercises     Shoulder Instructions       General Comments      Pertinent Vitals/ Pain       Pain Assessment: Faces Faces Pain Scale: No hurt  Home Living                                          Prior Functioning/Environment              Frequency  Min 1X/week        Progress Toward Goals  OT Goals(current goals can now be found in the care plan section)  Progress towards OT goals: Progressing toward goals  Acute Rehab OT Goals Patient Stated Goal: unable to verbalize OT Goal Formulation: Patient unable to participate in goal setting Time For Goal Achievement: 10/09/20 Potential to Achieve Goals: Fair  Plan Discharge plan remains appropriate;Frequency remains appropriate    Co-evaluation                 AM-PAC OT "6 Clicks" Daily Activity     Outcome Measure   Help from another person eating meals?: A Lot Help from another person taking care of personal grooming?: A Lot Help from another person toileting, which includes using toliet, bedpan, or urinal?: Total Help from another person bathing (including washing, rinsing, drying)?: Total Help from another person to put on and taking off regular upper body clothing?: A Lot Help from another person to put on and taking off regular lower body clothing?: Total 6 Click Score: 9    End of Session    OT Visit Diagnosis: Other abnormalities of gait and mobility (R26.89);Other symptoms and signs involving cognitive function   Activity Tolerance Patient tolerated treatment well    Patient Left in bed;with call bell/phone within reach;with bed alarm set   Nurse Communication          Time: 2330-0762 OT Time Calculation (min): 15 min  Charges: OT General Charges $OT Visit: 1 Visit OT Treatments $Self Care/Home Management : 8-22 mins  Wynona Canes, MPH, MS, OTR/L ascom [phone] 10/01/20, 10:26 AM

## 2020-10-01 NOTE — Care Management Important Message (Signed)
Important Message  Patient Details  Name: Jeremy Christensen. MRN: [id number] Date of Birth: 03-03-1946   Medicare Important Message Given:  Other (see comment)  Left a message on legal guardian's, Myriam Forehand office phone [phone] with details about the Important Message from Medicare. Discharge order is in for the patient to transfer to SNF.  Asked that she call me at her earliest convenience.   Olegario Messier A Jaena Brocato 10/01/2020, 3:26 PM

## 2020-10-01 NOTE — TOC Transition Note (Addendum)
Transition of Care [facility]) - CM/SW Discharge Note   Patient Details  Name: Jeremy Christensen. MRN: [id number] Date of Birth: 1945/05/16  Transition of Care [facility]) CM/SW Contact:  Hetty Ely, RN Phone Number: 10/01/2020, 2:22 PM    Final next level of care: Skilled Nursing Facility Barriers to Discharge: Barriers Resolved   Patient Goals and CMS Choice   CMS Medicare.gov Compare Post Acute Care list provided to:: Legal Guardian Choice offered to / list presented to : [facility] POA / Guardian  Discharge Placement  2:35pm: Ms. Jeremy Christensen from [facility] DSS Guardianship office on the unit, requested copy of Discharge Summary, information given.                Patient to be transferred to facility by: [facility] Name of family member notified: Dionne Ano mailbox is full Patient and family notified of of transfer: 10/01/20  Discharge Plan and Services   Discharge Planning Services: CM Consult Post Acute Care Choice: Skilled Nursing Facility          DME Arranged: N/A DME Agency: NA       HH Arranged: NA          Social Determinants of Health (SDOH) Interventions     Readmission Risk Interventions No flowsheet data found.

## 2020-10-01 NOTE — Discharge Summary (Signed)
Physician Discharge Summary  Jeremy Christensen. SKS:[id number] DOB: Aug 23, 1945 DOA: 09/22/2020  PCP: Preston Fleeting, MD  Admit date: 09/22/2020 Discharge date: 10/01/2020  Admitted From: Group home Discharge disposition: Nursing facility   Code Status: Full Code  Diet Recommendation: Pured diet with nectar thick liquid.   Discharge Diagnosis:   Principal Problem:   Altered mental status Active Problems:   Essential hypertension   Seizure disorder (HCC)   Dementia with behavioral disturbance (HCC)   AMS (altered mental status)     Chief Complaint  Patient presents with   Weakness   Brief narrative: Jeremy Christensen [name]. is a 75 y.o. male with PMH significant for advanced dementia with behavioral disturbance and amyloid angiopathy, seizure disorder, sinus bradycardia, hypertension, essential tremor, BPH. Patient presented to the ED from [facility]  family care group home for evaluation of altered mental status and generalized weakness.  He was recently admitted from 09/16/2020-09/19/2020 for acute metabolic encephalopathy suspected due to dehydration.  All the work-up was unremarkable and patient improved, discharged back to group home on 7/6. On 7/9, patient was apparently found laying on his back on the floor, unarousable.  EMS was called and patient was brought to the ED .  In the ED, patient was afebrile, hemodynamically stable Labs unremarkable Chest x-ray did not show any consolidation, edema or effusion CT scan of head did not show any intracranial pathology.   Patient was admitted to hospitalist service for further evaluation management See below for details.  Subjective: Patient was seen and examined this morning.   Lying on bed.  Has mittens in both hands.   Open eyes and verbal command.  Not in distress.  Has mittens in his hand.  Able to tell me his name.    Hospital course: Acute metabolic encephalopathy History of advanced dementia with behavioral  changes -Unclear etiology.  No evidence of infection.  Recently admitted for similar presentation.  No clear triggers were identified at that time.  He was thought to be dehydrated and given adequate hydration. -EEG did not show any epileptiform discharges but showed moderate diffuse encephalopathy of nonspecific etiology. -In last several days, his mental status has been gradually improving and stable.   -Continue home donepezil, Abilify, amantadine, trazodone, Ativan.  Dysphagia -Continue aspiration precaution fall precautions -Pured diet, nectar thick liquid -Speech and swallow evaluation to continue.   Seizure disorder: -Home meds include Keppra 1000 mg a.m. and 1500 mg p.m., Lamictal 150 mg twice daily -Continue the same. -EEG did not show any specific epileptiform discharges -Patient has good oral intake now.   Sinus bradycardia -Chronic and appears stable with rates between 40-60 on admission.   Hypertension: -amlodipine and lisinopril on hold because of soft blood pressure.  Blood pressure mostly stable under 140.  Continue to monitor.   BPH Continue Flomax.    Allergies as of 10/01/2020       Reactions   Tetracyclines & Related Hives        Medication List     STOP taking these medications    amLODipine 2.5 MG tablet Commonly known as: NORVASC   lisinopril 10 MG tablet Commonly known as: ZESTRIL       TAKE these medications    acetaminophen 325 MG tablet Commonly known as: TYLENOL Take 650 mg by mouth every 6 (six) hours as needed for mild pain or fever.   aluminum-magnesium hydroxide-simethicone 200-200-20 MG/5ML Susp Commonly known as: MAALOX Take 15-30 mLs by mouth as needed (indigestion).  Take per standing as needed orders for indigestion/heartburn   Amantadine HCl 100 MG tablet Take 50 mg by mouth 2 (two) times daily.   ARIPiprazole 20 MG tablet Commonly known as: ABILIFY Take 20 mg by mouth at bedtime.   bismuth subsalicylate 262  MG/15ML suspension Commonly known as: PEPTO BISMOL Take 30 mLs by mouth as needed. Take per standing as needed orders for nausea   cyanocobalamin 2000 MCG tablet Take 2,000 mcg by mouth daily.   donepezil 10 MG tablet Commonly known as: ARICEPT Take 10 mg by mouth at bedtime.   guaiFENesin 100 MG/5ML liquid Commonly known as: ROBITUSSIN Take 200 mg by mouth as needed. Take per standing as needed orders for cough   lamoTRIgine 150 MG tablet Commonly known as: LAMICTAL Take 150 mg by mouth 2 (two) times daily.   levETIRAcetam 500 MG tablet Commonly known as: KEPPRA Take 1,000-1,500 mg by mouth 2 (two) times daily. Take 2 tablets ( ) in the morning and 3 tablets ( ) nightly   loperamide 2 MG capsule Commonly known as: IMODIUM Take 2 mg by mouth as needed for diarrhea or loose stools.   LORazepam 1 MG tablet Commonly known as: ATIVAN Take 1 mg by mouth 2 (two) times daily as needed for anxiety.   magnesium hydroxide 400 MG/5ML suspension Commonly known as: MILK OF MAGNESIA Take 15-30 mLs by mouth as needed for mild constipation or moderate constipation. Take per standing as needed orders for constipation   neomycin-bacitracin-polymyxin 5-220 [id number] ointment Apply 1 application topically as needed. For minor abrasions   tamsulosin 0.4 MG Caps capsule Commonly known as: FLOMAX Take 0.4 mg by mouth at bedtime.   traZODone 100 MG tablet Commonly known as: DESYREL Take 100 mg by mouth at bedtime.   Vitamin D3 25 MCG (1000 UT) Caps Take 1 capsule by mouth daily.        Discharge Instructions:  Follow with Primary MD Revelo, Presley Raddle, MD in 7 days   Get CBC/BMP checked in next visit within 1 week by PCP or SNF MD ( we routinely change or add medications that can affect your baseline labs and fluid status, therefore we recommend that you get the mentioned basic workup next visit with your PCP, your PCP may decide not to get them or add new tests based on  their clinical decision)  On your next visit with your PCP, please Get Medicines reviewed and adjusted.  Please request your PCP  to go over all Hospital Tests and Procedure/Radiological results at the follow up, please get all Hospital records sent to your Prim MD by signing hospital release before you go home.  Activity: As tolerated with Full fall precautions use walker/cane & assistance as needed  For Heart failure patients - Check your Weight same time everyday, if you gain over 2 pounds, or you develop in leg swelling, experience more shortness of breath or chest pain, call your Primary MD immediately. Follow Cardiac Low Salt Diet and 1.5 lit/day fluid restriction.  If you have smoked or chewed Tobacco in the last 2 yrs please stop smoking, stop any regular Alcohol  and or any Recreational drug use.  If you experience worsening of your admission symptoms, develop shortness of breath, life threatening emergency, suicidal or homicidal thoughts you must seek medical attention immediately by calling 911 or calling your MD immediately  if symptoms less severe.  You Must read complete instructions/literature along with all the possible adverse reactions/side effects for all the Medicines you take  and that have been prescribed to you. Take any new Medicines after you have completely understood and accpet all the possible adverse reactions/side effects.   Do not drive, operate heavy machinery, perform activities at heights, swimming or participation in water activities or provide baby sitting services if your were admitted for syncope or siezures until you have seen by Primary MD or a Neurologist and advised to do so again.  Do not drive when taking Pain medications.  Do not take more than prescribed Pain, Sleep and Anxiety Medications  Wear Seat belts while driving.   Please note You were cared for by a hospitalist during your hospital stay. If you have any questions about your discharge  medications or the care you received while you were in the hospital after you are discharged, you can call the unit and asked to speak with the hospitalist on call if the hospitalist that took care of you is not available. Once you are discharged, your primary care physician will handle any further medical issues. Please note that NO REFILLS for any discharge medications will be authorized once you are discharged, as it is imperative that you return to your primary care physician (or establish a relationship with a primary care physician if you do not have one) for your aftercare needs so that they can reassess your need for medications and monitor your lab values.    Follow ups:   Discharge Instructions     Diet general   Complete by: As directed    Dysphagia 1 diet with pured and nectar thick liquid   Increase activity slowly   Complete by: As directed        Follow-up Information     Revelo, Presley RaddleAdrian Mancheno, MD Follow up.   Specialty: Family Medicine Contact information: [address] [place] [address] [phone]                 Wound care:     Discharge Exam:   Vitals:   09/30/20 2001 10/01/20 0009 10/01/20 0416 10/01/20 0854  BP: (!) 150/80 115/84 139/79 126/74  Pulse: (!) 57 (!) 57 (!) 56 (!) 50  Resp: 18 18 18 17   Temp: 98.4 F (36.9 C) 97.6 F (36.4 C) 98.2 F (36.8 C) 98.5 F (36.9 C)  TempSrc: Oral Oral Axillary Axillary  SpO2: 94% 97% 97% 99%  Weight:      Height:        Body mass index is 32.48 kg/m.  General exam: Pleasant, elderly Caucasian male.  Not in physical distress Skin: No rashes, lesions or ulcers. HEENT: Atraumatic, normocephalic, no obvious bleeding Lungs: Clear to auscultation bilaterally CVS: Regular rate and rhythm, no murmur GI/Abd soft, nontender, nondistended, bowel sound present CNS: Alert, awake, can tell his name.  Has mittens in his hands Psychiatry: Mood appropriate Extremities: No pedal edema, no  calf tenderness  Time coordinating discharge: 35 minutes   The results of significant diagnostics from this hospitalization (including imaging, microbiology, ancillary and laboratory) are listed below for reference.    Procedures and Diagnostic Studies:   CT Head Wo Contrast  Result Date: 09/22/2020 CLINICAL DATA:  Head trauma, weakness EXAM: CT HEAD WITHOUT CONTRAST TECHNIQUE: Contiguous axial images were obtained from the base of the skull through the vertex without intravenous contrast. COMPARISON:  09/16/2020 FINDINGS: Brain: No evidence of acute infarction, hemorrhage, hydrocephalus, extra-axial collection or mass lesion/mass effect. Extensive periventricular and deep white matter hypodensity. Vascular: No hyperdense vessel or unexpected calcification. Skull:  Normal. Negative for fracture or focal lesion. Sinuses/Orbits: No acute finding. Other: None. IMPRESSION: No acute intracranial pathology. Small-vessel white matter disease. Electronically Signed   By: Lauralyn Primes M.D.   On: 09/22/2020 18:38   DG Chest Portable 1 View  Result Date: 09/22/2020 CLINICAL DATA:  Weakness. EXAM: PORTABLE CHEST 1 VIEW COMPARISON:  Single-view of the chest 09/16/2020. FINDINGS: There is cardiomegaly and vascular congestion. No consolidative process, pneumothorax or effusion. No acute or focal bony abnormality. IMPRESSION: Cardiomegaly and pulmonary vascular congestion. Electronically Signed   By: Drusilla Kanner M.D.   On: 09/22/2020 18:08     Labs:   Basic Metabolic Panel: Recent Labs  Lab 09/25/20 0509 09/26/20 0502 09/27/20 0508 09/28/20 0442  NA 139 139 138 137  K 3.8 4.0 3.9 4.1  CL 107 106 105 104  CO2 25 25 25 27   GLUCOSE 92 96 91 92  BUN 16 20 17  24*  CREATININE 1.04 1.04 1.06 1.24  CALCIUM 8.6* 8.7* 8.7* 8.4*  MG 2.0 2.0  --   --   PHOS 2.7 2.6  --   --    GFR Estimated Creatinine Clearance: 59.9 mL/min (by C-G formula based on SCr of 1.24 mg/dL). Liver Function Tests: No results  for input(s): AST, ALT, ALKPHOS, BILITOT, PROT, ALBUMIN in the last 168 hours. No results for input(s): LIPASE, AMYLASE in the last 168 hours. No results for input(s): AMMONIA in the last 168 hours. Coagulation profile No results for input(s): INR, PROTIME in the last 168 hours.  CBC: Recent Labs  Lab 09/25/20 0509 09/26/20 0502 09/27/20 0508 09/28/20 0442  WBC 6.2 6.9 5.8 5.6  HGB 13.8 13.5 14.1 13.7  HCT 41.8 39.8 41.8 41.0  MCV 96.1 97.1 96.8 98.3  PLT 199 193 212 219   Cardiac Enzymes: No results for input(s): CKTOTAL, CKMB, CKMBINDEX, TROPONINI in the last 168 hours. BNP: Invalid input(s): POCBNP CBG: No results for input(s): GLUCAP in the last 168 hours. D-Dimer No results for input(s): DDIMER in the last 72 hours. Hgb A1c No results for input(s): HGBA1C in the last 72 hours. Lipid Profile No results for input(s): CHOL, HDL, LDLCALC, TRIG, CHOLHDL, LDLDIRECT in the last 72 hours. Thyroid function studies No results for input(s): TSH, T4TOTAL, T3FREE, THYROIDAB in the last 72 hours.  Invalid input(s): FREET3 Anemia work up No results for input(s): VITAMINB12, FOLATE, FERRITIN, TIBC, IRON, RETICCTPCT in the last 72 hours. Microbiology Recent Results (from the past 240 hour(s))  Resp Panel by RT-PCR (Flu A&B, Covid) Nasopharyngeal Swab     Status: None   Collection Time: 09/22/20  5:28 PM   Specimen: Nasopharyngeal Swab; Nasopharyngeal(NP) swabs in vial transport medium  Result Value Ref Range Status   SARS Coronavirus 2 by RT PCR NEGATIVE NEGATIVE Final    Comment: (NOTE) SARS-CoV-2 target nucleic acids are NOT DETECTED.  The SARS-CoV-2 RNA is generally detectable in upper respiratory specimens during the acute phase of infection. The lowest concentration of SARS-CoV-2 viral copies this assay can detect is 138 copies/mL. A negative result does not preclude SARS-Cov-2 infection and should not be used as the sole basis for treatment or other patient management  decisions. A negative result may occur with  improper specimen collection/handling, submission of specimen other than nasopharyngeal swab, presence of viral mutation(s) within the areas targeted by this assay, and inadequate number of viral copies(<138 copies/mL). A negative result must be combined with clinical observations, patient history, and epidemiological information. The expected result is Negative.  Fact  Sheet for Patients:  BloggerCourse.com  Fact Sheet for Healthcare Providers:  SeriousBroker.it  This test is no t yet approved or cleared by the [place] FDA and  has been authorized for detection and/or diagnosis of SARS-CoV-2 by FDA under an Emergency Use Authorization (EUA). This EUA will remain  in effect (meaning this test can be used) for the duration of the COVID-19 declaration under Section 564(b)(1) of the Act, 21 U.S.C.section 360bbb-3(b)(1), unless the authorization is terminated  or revoked sooner.       Influenza A by PCR NEGATIVE NEGATIVE Final   Influenza B by PCR NEGATIVE NEGATIVE Final    Comment: (NOTE) The Xpert Xpress SARS-CoV-2/FLU/RSV plus assay is intended as an aid in the diagnosis of influenza from Nasopharyngeal swab specimens and should not be used as a sole basis for treatment. Nasal washings and aspirates are unacceptable for Xpert Xpress SARS-CoV-2/FLU/RSV testing.  Fact Sheet for Patients: BloggerCourse.com  Fact Sheet for Healthcare Providers: SeriousBroker.it  This test is not yet approved or cleared by the [place] FDA and has been authorized for detection and/or diagnosis of SARS-CoV-2 by FDA under an Emergency Use Authorization (EUA). This EUA will remain in effect (meaning this test can be used) for the duration of the COVID-19 declaration under Section 564(b)(1) of the Act, 21 U.S.C. section 360bbb-3(b)(1), unless the  authorization is terminated or revoked.  Performed at [facility], [address] [place], [place] [address]      Signed: Lorin Glass  [facility] 10/01/2020, 10:26 AM

## 2020-10-01 NOTE — Care Management Important Message (Signed)
Important Message  Patient Details  Name: Jeremy Christensen. MRN: [id number] Date of Birth: 01/25/1946   Medicare Important Message Given:  Other (see comment)  I tried calling the Legal guardian, Myriam Forehand with DSS [phone]) to review the Important Message from Medicare but her mailbox is still full.  Will try again.   Olegario Messier A Cella Cappello 10/01/2020, 11:23 AM

## 2021-12-04 IMAGING — US US ABDOMEN LIMITED
1 series · 15 of 25 positions shown · non-contrast
Comparison: None.

CLINICAL DATA: Elevated bilirubin

EXAM:
ULTRASOUND ABDOMEN LIMITED RIGHT UPPER QUADRANT

[Series 1: us abdomen limited ruq · 15 of 49 slices shown]
[im 1/49]
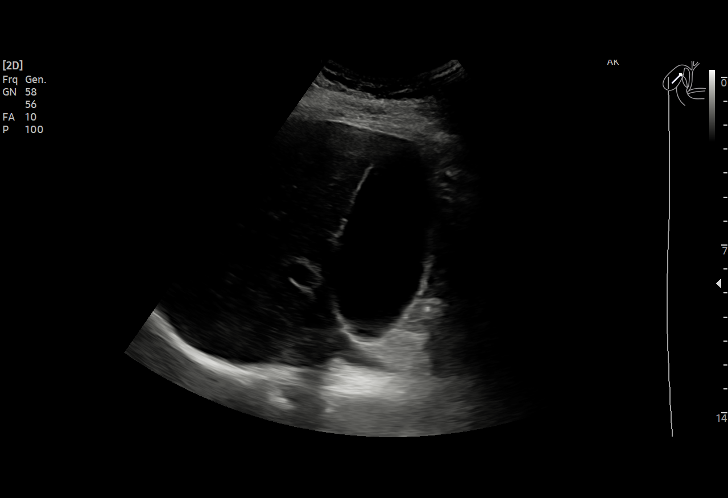
[im 5/49]
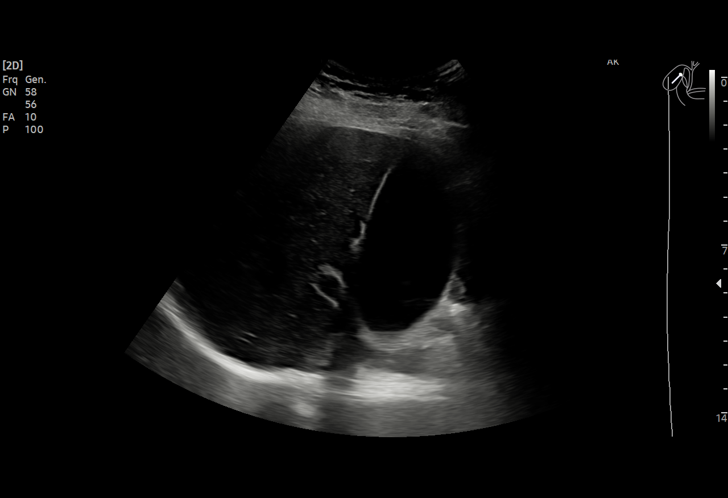
[im 9/49]
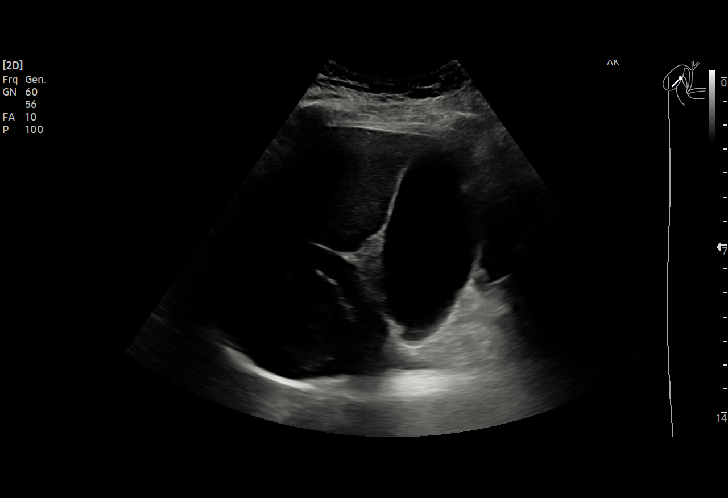
[im 11/49]
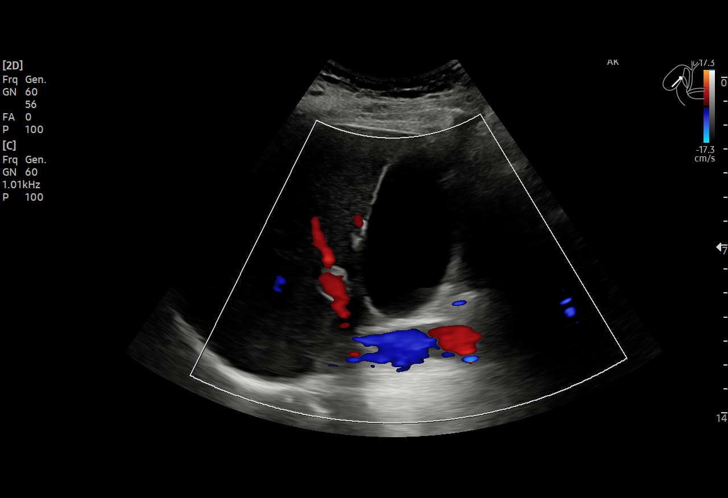
[im 15/49]
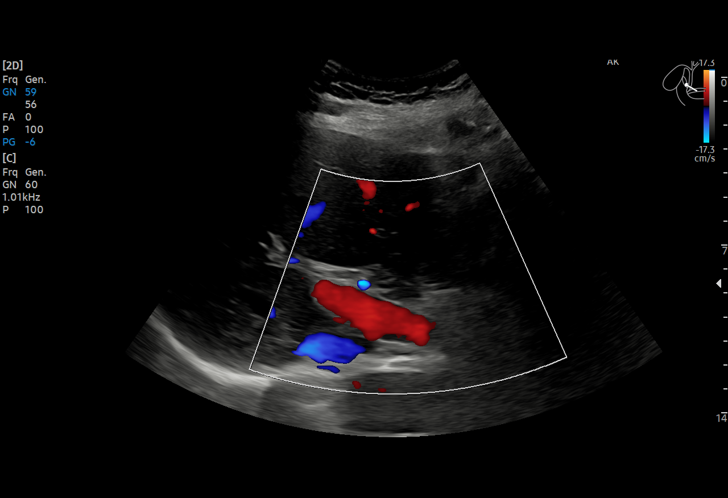
[im 19/49]
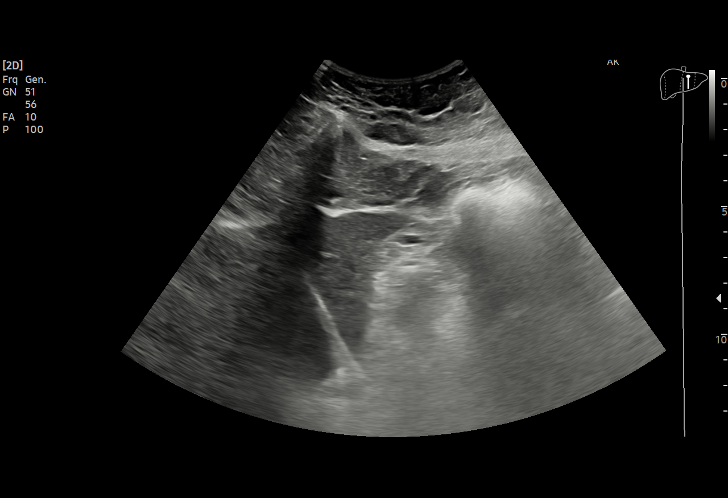
[im 21/49]
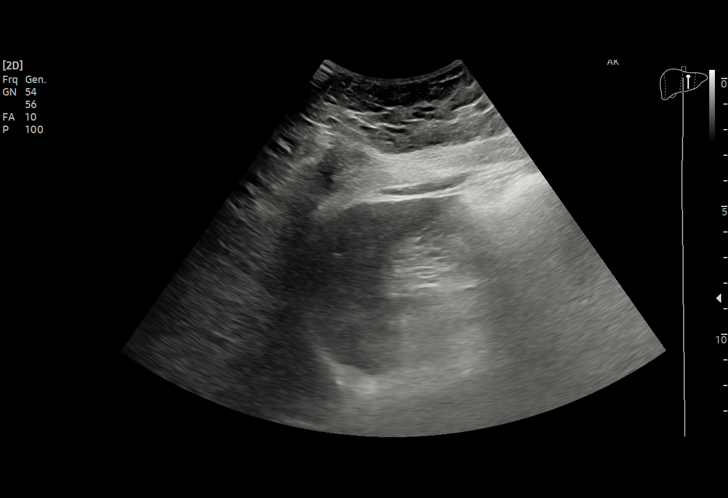
[im 25/49]
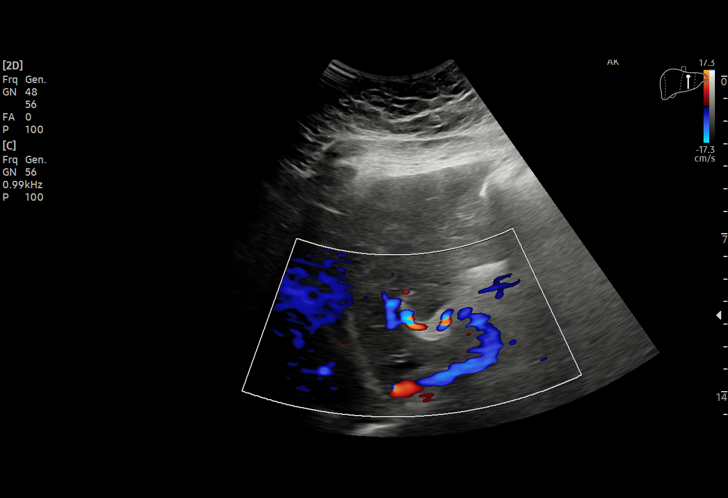
[im 29/49]
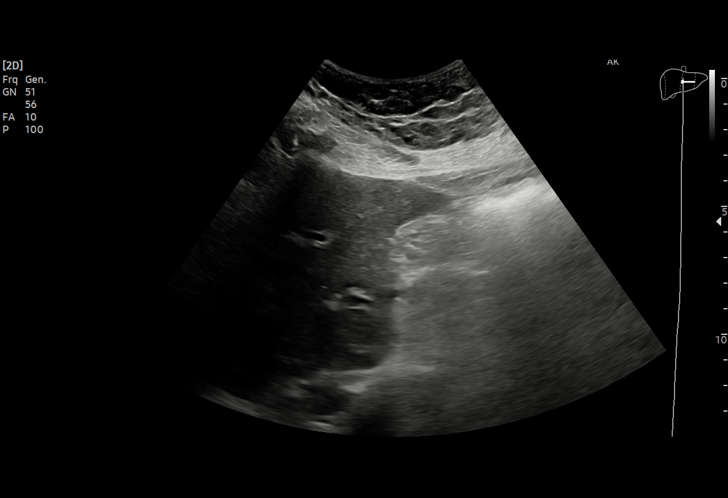
[im 31/49]
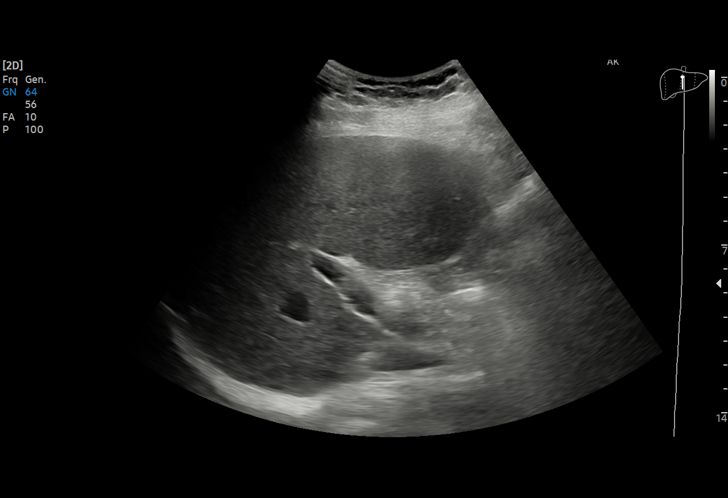
[im 35/49]
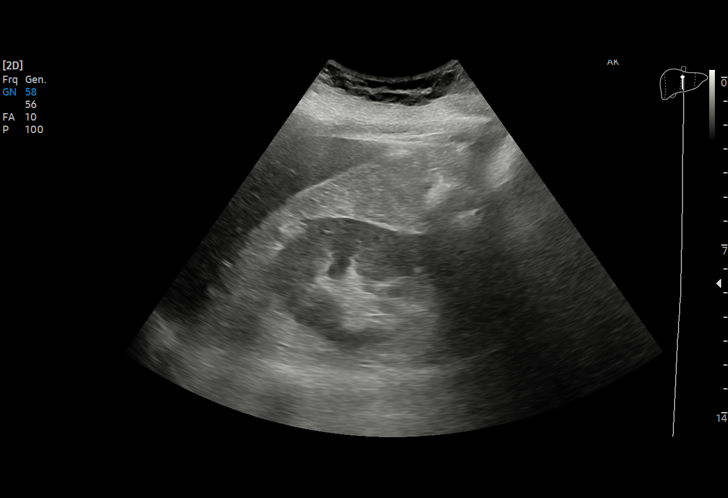
[im 39/49]
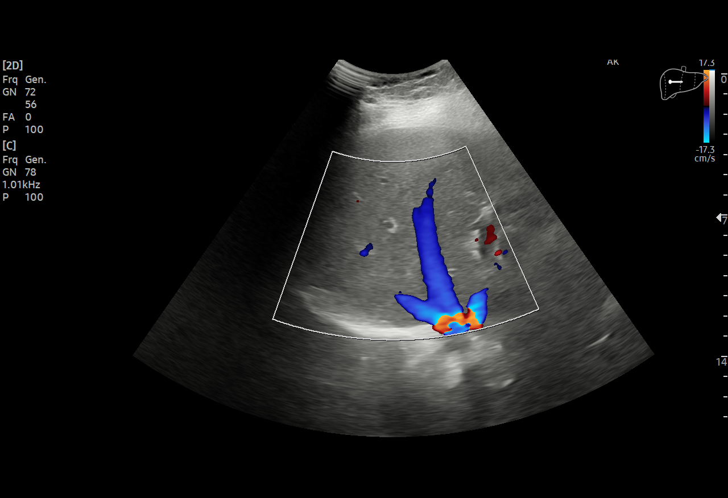
[im 41/49]
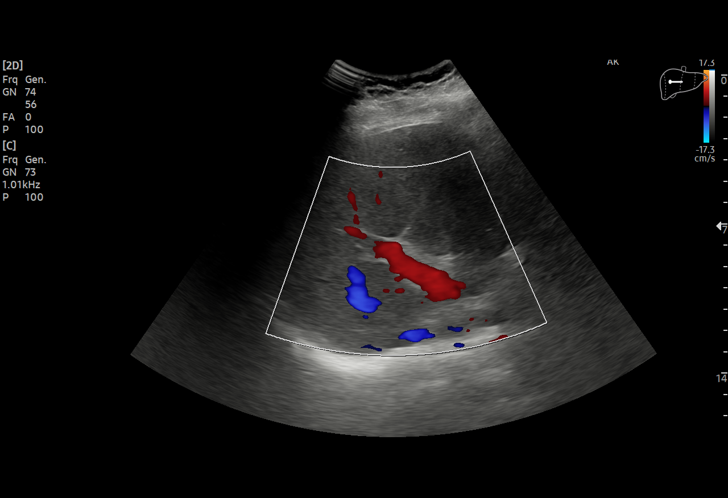
[im 45/49]
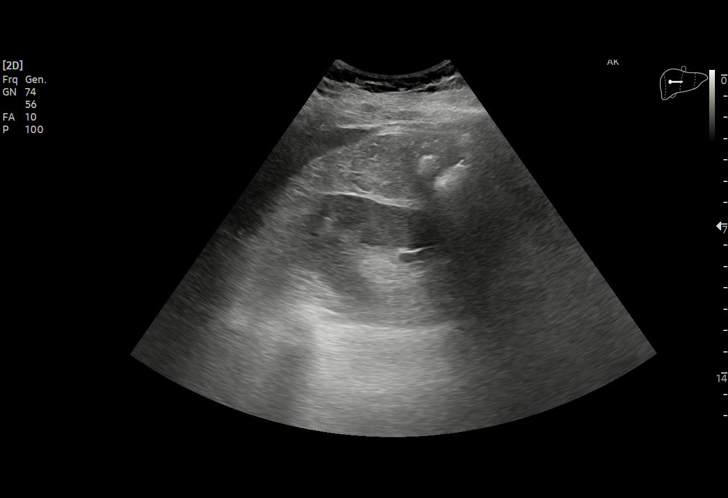
[im 49/49]
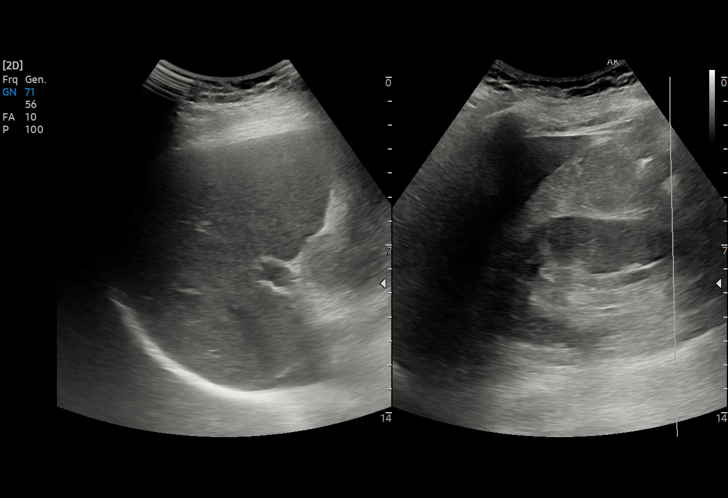

[15 of 25 positions shown; findings below may reference images not displayed]

FINDINGS: Gallbladder:

Gallbladder is distended. No gallstones or wall thickening
visualized. No sonographic Murphy sign noted by sonographer.

Common bile duct:

Diameter: 4 mm

Liver:

No focal lesion identified. Within normal limits in parenchymal
echogenicity. Portal vein is patent on color Doppler imaging with
normal direction of blood flow towards the liver.

Other: None.
IMPRESSION: Distended gallbladder without evidence of cholelithiasis or acute
cholecystitis.

No biliary ductal dilation.

## 2022-04-17 DEATH — deceased
